# Patient Record
Sex: Female | Born: 1945 | Race: White | Hispanic: No | Smoking: Never smoker
Health system: Southern US, Community
[De-identification: ages and names within clinical notes are randomized; demographics above are authoritative.]

## PROBLEM LIST (undated history)

## (undated) DIAGNOSIS — C719 Malignant neoplasm of brain, unspecified: Secondary | ICD-10-CM

## (undated) HISTORY — DX: Malignant neoplasm of brain, unspecified: C71.9

---

## 2017-07-31 ENCOUNTER — Ambulatory Visit: Payer: Medicare Other | Admitting: Podiatry

## 2017-08-13 ENCOUNTER — Ambulatory Visit (INDEPENDENT_AMBULATORY_CARE_PROVIDER_SITE_OTHER): Payer: Medicare Other

## 2017-08-13 ENCOUNTER — Encounter: Payer: Self-pay | Admitting: Podiatry

## 2017-08-13 ENCOUNTER — Ambulatory Visit (INDEPENDENT_AMBULATORY_CARE_PROVIDER_SITE_OTHER): Payer: Medicare Other | Admitting: Podiatry

## 2017-08-13 DIAGNOSIS — D3610 Benign neoplasm of peripheral nerves and autonomic nervous system, unspecified: Secondary | ICD-10-CM

## 2017-08-17 NOTE — Progress Notes (Signed)
  Subjective:  Patient ID: Miranda Martinez, female    DOB: March 31, 1946,  MRN: [id number]  Chief Complaint  Patient presents with  . 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 female presents with the above complaint.  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  Reports recent flareup; states that it often does cause pain.  States the pain is a 1-1/2 of 10 today.  Was previously 4-10.  States that she just wanted to double check with a diagnosis was for her issue.  History reviewed. No pertinent past medical history. History reviewed. No pertinent surgical history. No current outpatient medications on file.  Allergies  Allergen Reactions  . Codeine   . Penicillins    Review of Systems all systems reviewed and negative except as noted in the HPI Objective:  There were no vitals filed for this visit. General AA&O x3. Normal mood and affect.  Vascular Dorsalis pedis and posterior tibial pulses  present 2+ bilaterally  Capillary refill normal to all digits. Pedal hair growth normal.  Neurologic Epicritic sensation grossly present.  Dermatologic No open lesions. Interspaces clear of maceration. Nails well groomed and normal in appearance.  Orthopedic: MMT 5/5 in dorsiflexion, plantarflexion, inversion, and eversion. Normal joint ROM without pain or crepitus. The Mulder's click left third interspace without pain to palpation   X-rays taken and reviewed.  No acute fractures and dislocations.  No osseous abnormalities noted Assessment & Plan:  Patient was evaluated and treated and all questions answered.  Neuroma left foot -Educated on etiology -Discussed with patient that should pain return would consider injection.  Patient does not wish any further treatment at this time.  Follow-up as needed for possible injection -Pads dispensed  Return if symptoms worsen or fail to improve.

## 2019-08-07 ENCOUNTER — Encounter: Payer: Self-pay | Admitting: Emergency Medicine

## 2019-08-07 ENCOUNTER — Ambulatory Visit
Admission: EM | Admit: 2019-08-07 | Discharge: 2019-08-07 | Disposition: A | Discharge status: Home / Self Care | Payer: Medicare Other | Attending: Emergency Medicine | Admitting: Emergency Medicine

## 2019-08-07 ENCOUNTER — Ambulatory Visit (INDEPENDENT_AMBULATORY_CARE_PROVIDER_SITE_OTHER): Payer: Medicare Other

## 2019-08-07 ENCOUNTER — Other Ambulatory Visit: Payer: Self-pay

## 2019-08-07 DIAGNOSIS — S82832A Other fracture of upper and lower end of left fibula, initial encounter for closed fracture: Secondary | ICD-10-CM

## 2019-08-07 DIAGNOSIS — R2242 Localized swelling, mass and lump, left lower limb: Secondary | ICD-10-CM | POA: Diagnosis not present

## 2019-08-07 DIAGNOSIS — S82452A Displaced comminuted fracture of shaft of left fibula, initial encounter for closed fracture: Secondary | ICD-10-CM

## 2019-08-07 DIAGNOSIS — M79672 Pain in left foot: Secondary | ICD-10-CM | POA: Diagnosis not present

## 2019-08-07 DIAGNOSIS — X501XXA Overexertion from prolonged static or awkward postures, initial encounter: Secondary | ICD-10-CM

## 2019-08-07 NOTE — Discharge Instructions (Addendum)
Recommend RICE: rest, ice, compression, elevation as needed for pain.    For pain: recommend 350 mg-1000 mg of Tylenol (acetaminophen) and/or 200 mg - 800 mg of Advil (ibuprofen, Motrin) every 8 hours as needed.  May alternate between the two throughout the day as they are generally safe to take together.  DO NOT exceed more than 3000 mg of Tylenol or 3200 mg of ibuprofen in a 24 hour period as this could damage your stomach, kidneys, liver, or increase your bleeding risk.

## 2019-08-07 NOTE — ED Notes (Signed)
Patient able to ambulate independently  

## 2019-08-07 NOTE — ED Provider Notes (Signed)
[facility]    CSN: [id number] Arrival date & time: 08/07/19  1503      History   Chief Complaint Chief Complaint  Patient presents with  . Fall    HPI Miranda Martinez is a 74 y.o. female presenting for left ankle pain, swelling status post fall around ten thirty this morning.  Patient states she was taking out the trash, rolled her ankle, fell.  Patient is to head trauma, LOC.  Has used ice with some relief.  Has been weight-bearing since accident.  Denies previous fracture.   History reviewed. No pertinent past medical history.  There are no active problems to display for this patient.   History reviewed. No pertinent surgical history.  OB History   No obstetric history on file.      Home Medications    Prior to Admission medications   Not on File    Family History Family History  Family history unknown: Yes    Social History Social History   Tobacco Use  . Smoking status: Never Smoker  . Smokeless tobacco: Never Used  Substance Use Topics  . Alcohol use: Never    Frequency: Never  . Drug use: Never     Allergies   Codeine and Penicillins   Review of Systems Review of Systems  Constitutional: Negative for fatigue and fever.  Respiratory: Negative for cough and shortness of breath.   Cardiovascular: Negative for chest pain and palpitations.  Musculoskeletal:       Positive for left ankle pain, swelling  Neurological: Negative for weakness and numbness.     Physical Exam Triage Vital Signs ED Triage Vitals [08/07/19 1523]  Enc Vitals Group     BP (!) 149/84     Pulse Rate 75     Resp 16     Temp 97.9 F (36.6 C)     Temp Source Temporal     SpO2 97 %     Weight      Height      Head Circumference      Peak Flow      Pain Score 2     Pain Loc      Pain Edu?      Excl. in [place]?    No data found.  Updated Vital Signs BP (!) 149/84 (BP Location: Right Arm)   Pulse 75   Temp 97.9 F (36.6 C) (Temporal)   Resp 16    SpO2 97%   Visual Acuity Right Eye Distance:   Left Eye Distance:   Bilateral Distance:    Right Eye Near:   Left Eye Near:    Bilateral Near:     Physical Exam Constitutional:      General: She is not in acute distress. HENT:     Head: Normocephalic and atraumatic.  Eyes:     General: No scleral icterus.    Pupils: Pupils are equal, round, and reactive to light.  Cardiovascular:     Rate and Rhythm: Normal rate.  Pulmonary:     Effort: Pulmonary effort is normal.  Musculoskeletal:     Left ankle: She exhibits decreased range of motion and swelling. She exhibits no ecchymosis, no deformity, no laceration and normal pulse. Achilles tendon exhibits no pain and no defect.       Feet:  Skin:    Coloration: Skin is not jaundiced or pale.  Neurological:     Mental Status: She is alert and oriented to person, place, and time.  UC Treatments / Results  Labs (all labs ordered are listed, but only abnormal results are displayed) Labs Reviewed - No data to display  EKG   Radiology Dg Ankle Complete Left  Result Date: 08/07/2019 CLINICAL DATA:  Fall.  Ankle swelling. EXAM: LEFT ANKLE COMPLETE - 3+ VIEW COMPARISON:  None. FINDINGS: Three views study shows a comminuted oblique fracture of the distal fibula, at the level of the ankle mortise. There is trace widening of the medial joint space. No associated fracture of the distal tibia evident. No worrisome lytic or sclerotic osseous abnormality. Soft tissue swelling associated. IMPRESSION: Comminuted distal fibula fracture with trace widening of the medial joint space at the ankle mortise. No associated distal tibial fracture evident. Electronically Signed   By: Misty Stanley M.D.   On: 08/07/2019 15:43   Dg Foot Complete Left  Result Date: 08/07/2019 CLINICAL DATA:  Fall.  Pain and swelling. EXAM: LEFT FOOT - COMPLETE 3+ VIEW COMPARISON:  08/13/2017. FINDINGS: Comminuted distal fibula fracture noted. No evidence for fracture  involving the bony anatomy of the foot. Soft tissue swelling noted about the ankle. IMPRESSION: Comminuted distal fibula fracture with ankle swelling. Electronically Signed   By: Misty Stanley M.D.   On: 08/07/2019 15:45    Procedures Procedures (including critical care time)  Medications Ordered in UC Medications - No data to display  Initial Impression / Assessment and Plan / UC Course  I have reviewed the triage vital signs and the nursing notes.  Pertinent labs & imaging results that were available during my care of the patient were reviewed by me and considered in my medical decision making (see chart for details).     Given mechanism of injury, gross edema x-rays of left foot, ankle were obtained in office, reviewed by me radiology: Positive for comminuted oblique distal fibula fracture.  Reviewed case with Dr. Stann Mainland (Ortho) who agrees assessment plan: Patient given cam walker boot, crutches for nonweightbearing ambulation/compression in office which he tolerated well.  Patient follow-up with Dr. Stann Mainland in office on Monday or Tuesday.  Return precautions discussed, patient verbalized understanding and is agreeable to plan. Final Clinical Impressions(s) / UC Diagnoses   Final diagnoses:  Closed fracture of distal end of left fibula, unspecified fracture morphology, initial encounter     Discharge Instructions     Recommend RICE: rest, ice, compression, elevation as needed for pain.    For pain: recommend 350 mg-1000 mg of Tylenol (acetaminophen) and/or 200 mg - 800 mg of Advil (ibuprofen, Motrin) every 8 hours as needed.  May alternate between the two throughout the day as they are generally safe to take together.  DO NOT exceed more than 3000 mg of Tylenol or 3200 mg of ibuprofen in a 24 hour period as this could damage your stomach, kidneys, liver, or increase your bleeding risk.    ED Prescriptions    None     PDMP not reviewed this encounter.   [place], [place],  [place] 08/07/19 1641

## 2019-08-07 NOTE — ED Triage Notes (Signed)
Pt presents to [facility] for assessment of left ankle pain after she slipped coming out the door and her knee buckled and her left ankle and foot bent under her.  Swelling and bruising noted to left ankle.

## 2021-07-12 IMAGING — DX DG FOOT COMPLETE 3+V*L*
3 series · 3 of 3 positions shown · non-contrast
Comparison: 08/13/2017.

CLINICAL DATA: Fall.  Pain and swelling.

EXAM:
LEFT FOOT - COMPLETE 3+ VIEW

[foot supine dp]
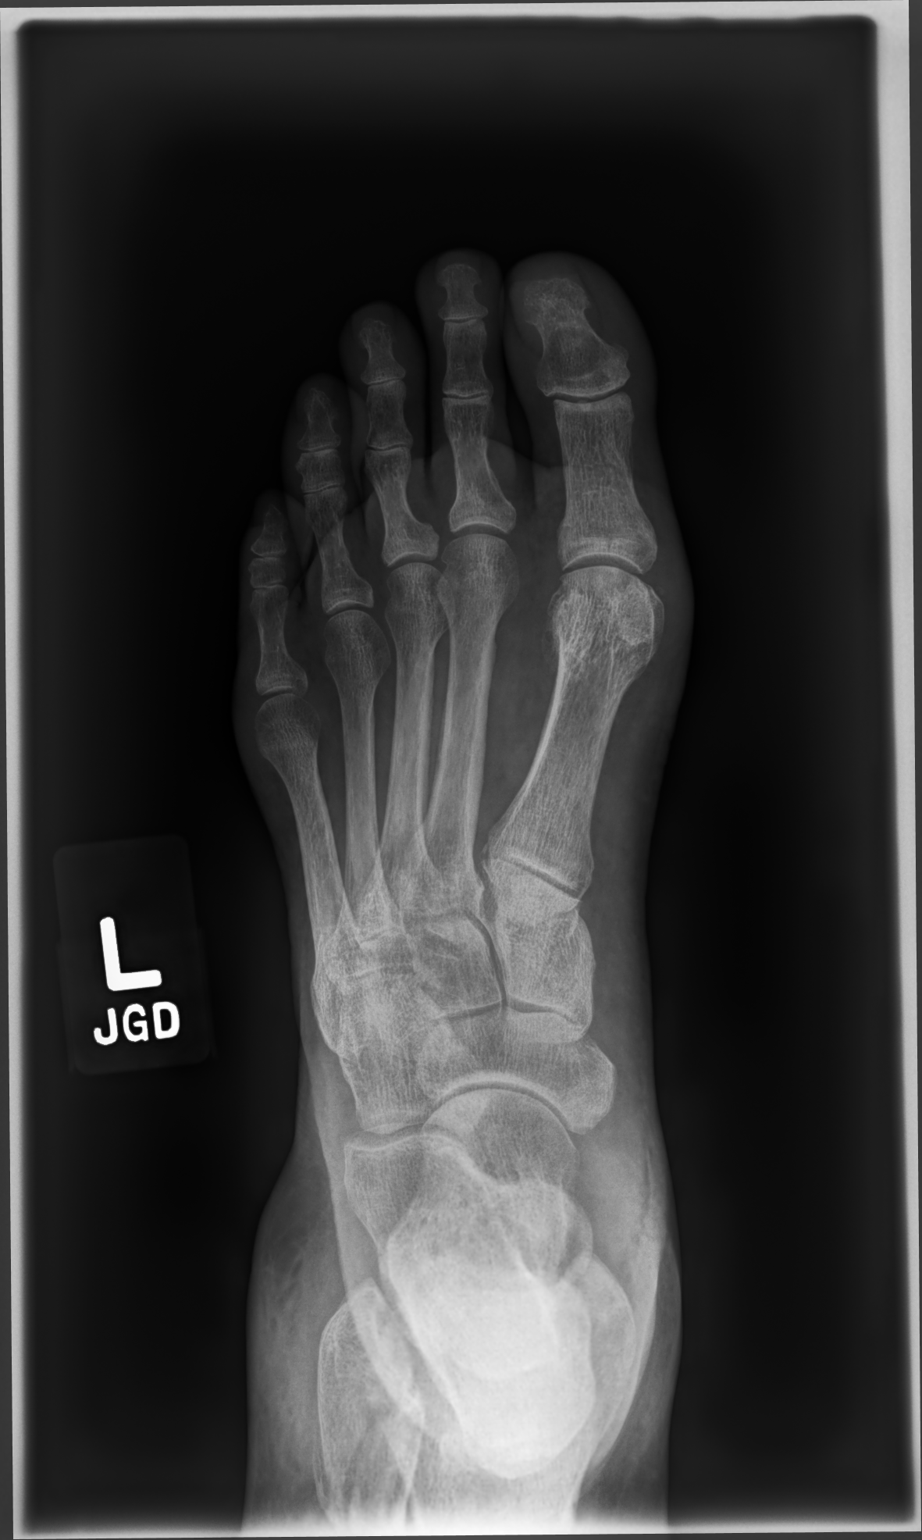

[foot medial oblique]
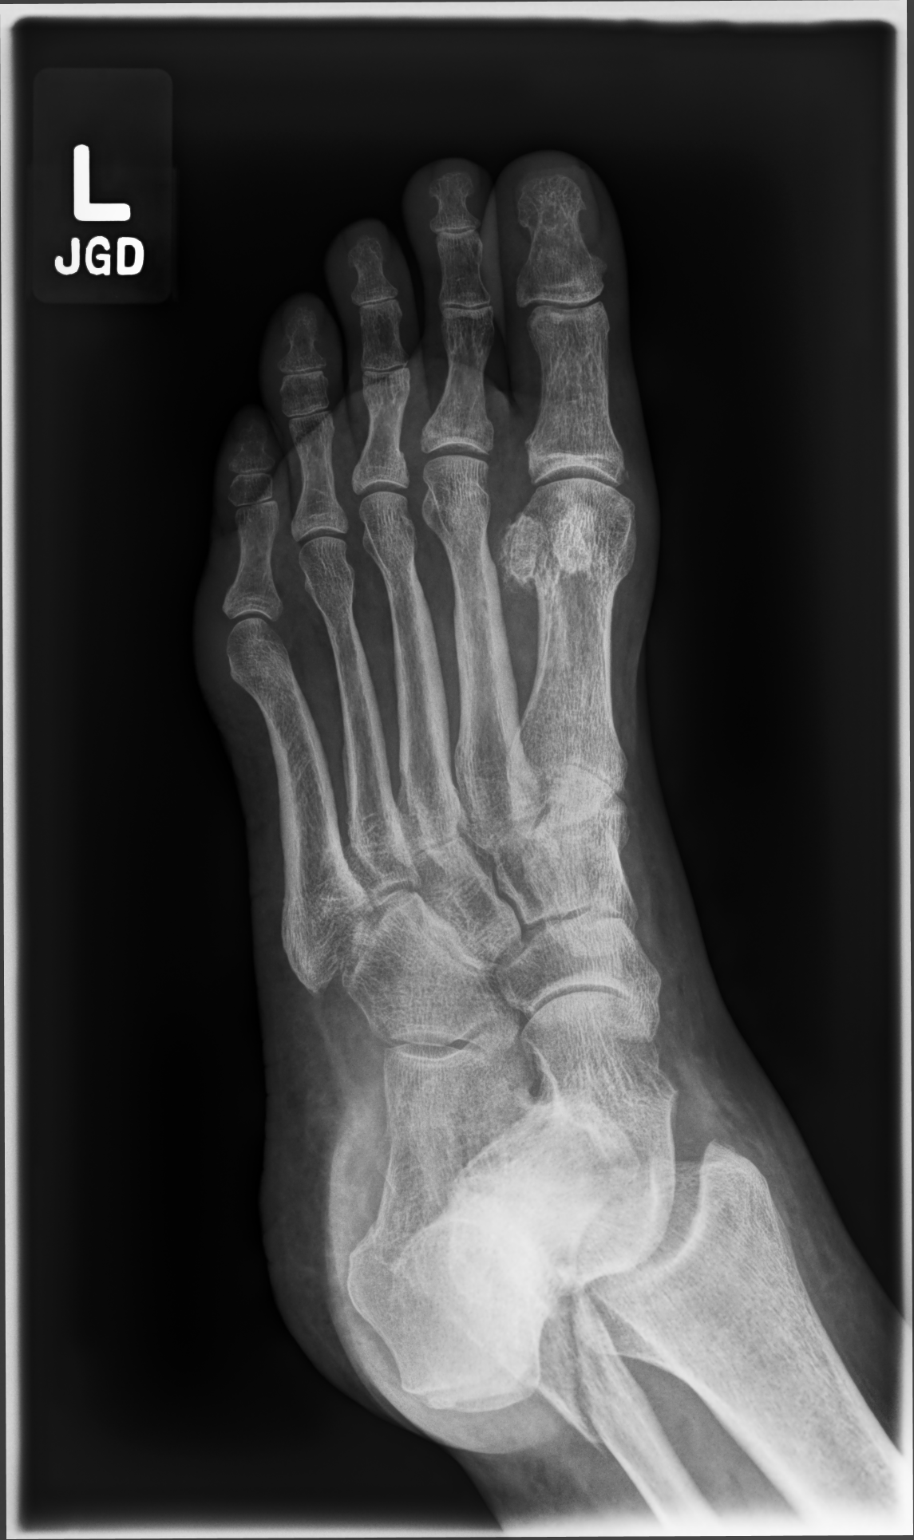

[foot supine lat]
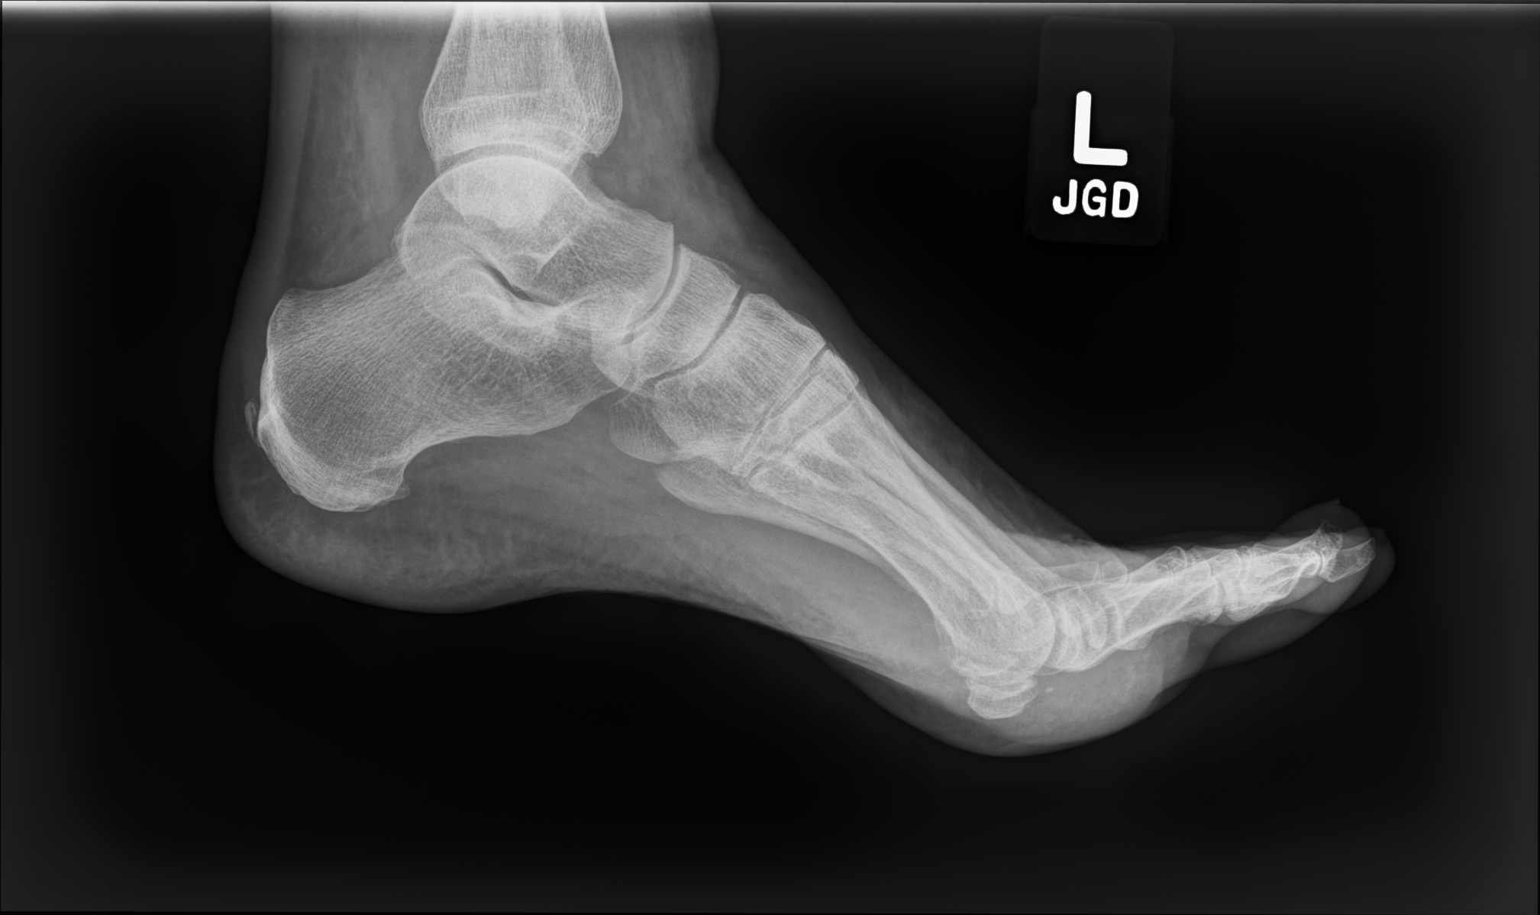

[3 of 3 positions shown; findings below may reference images not displayed]

FINDINGS: Comminuted distal fibula fracture noted. No evidence for fracture
involving the bony anatomy of the foot. Soft tissue swelling noted
about the ankle.
IMPRESSION: Comminuted distal fibula fracture with ankle swelling.

## 2021-07-12 IMAGING — DX DG ANKLE COMPLETE 3+V*L*
3 series · 3 of 3 positions shown · non-contrast
Comparison: None.

CLINICAL DATA: Fall.  Ankle swelling.

EXAM:
LEFT ANKLE COMPLETE - 3+ VIEW

[ankle ap]
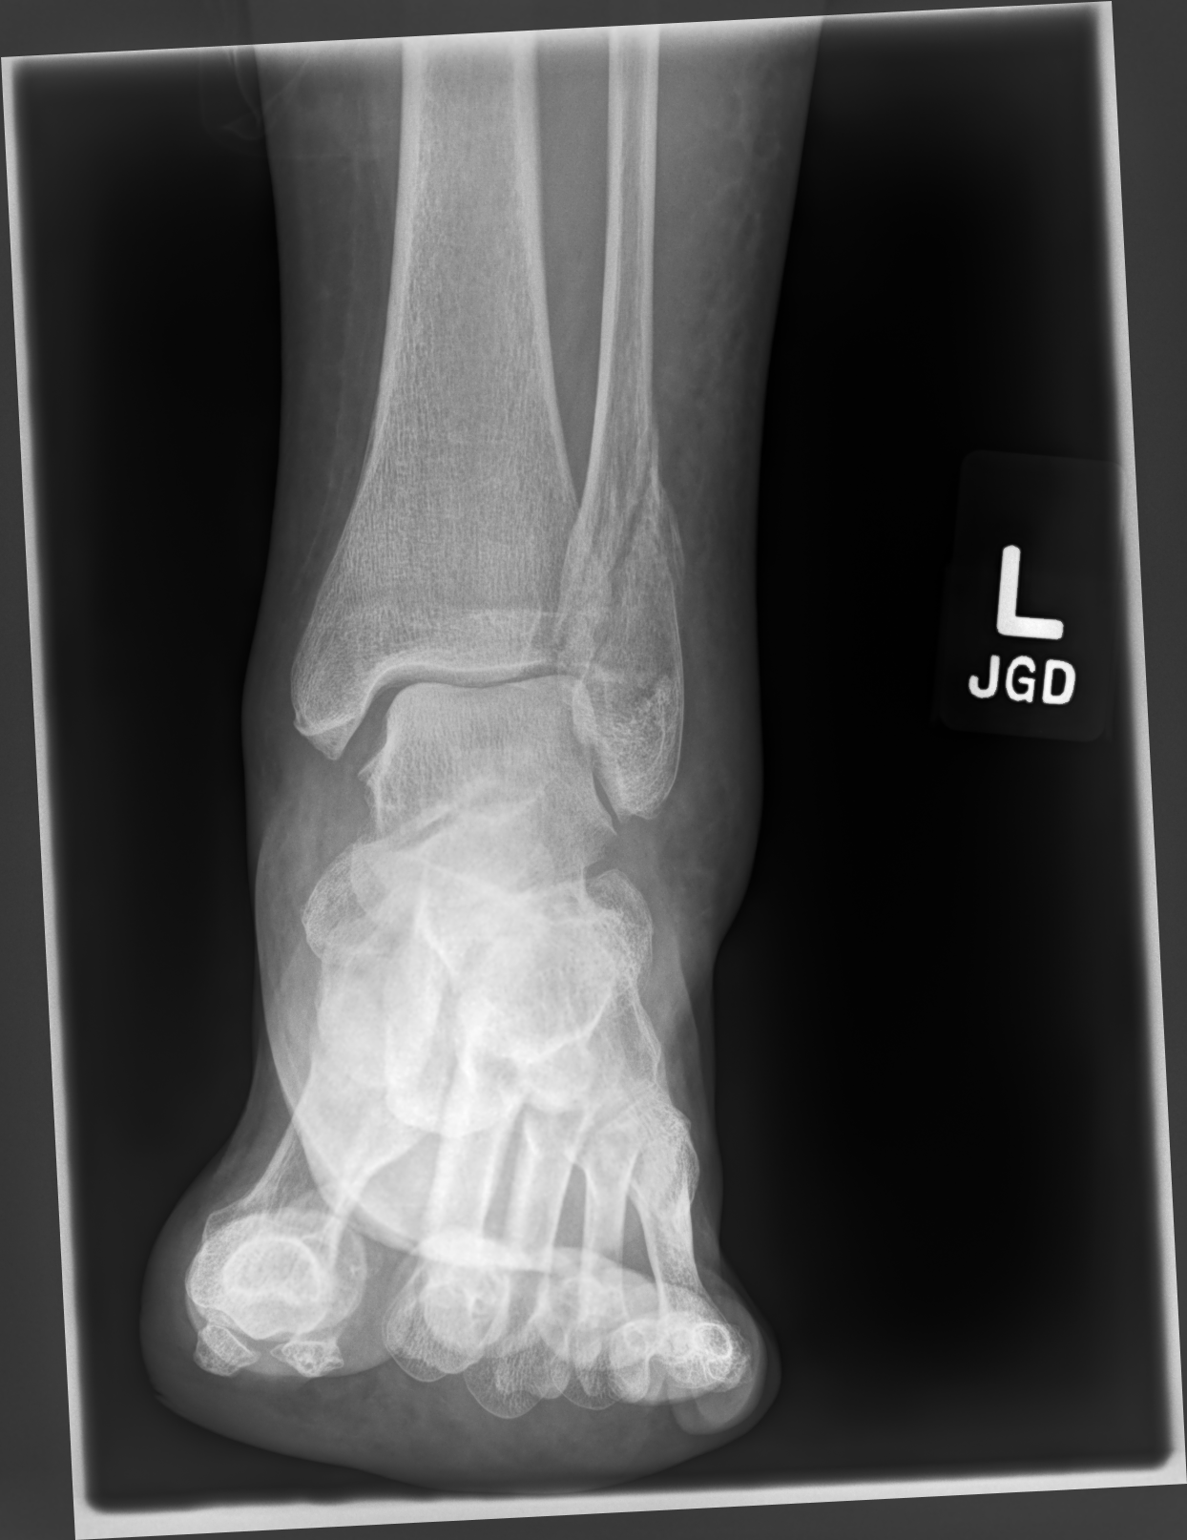

[ankle medial oblique]
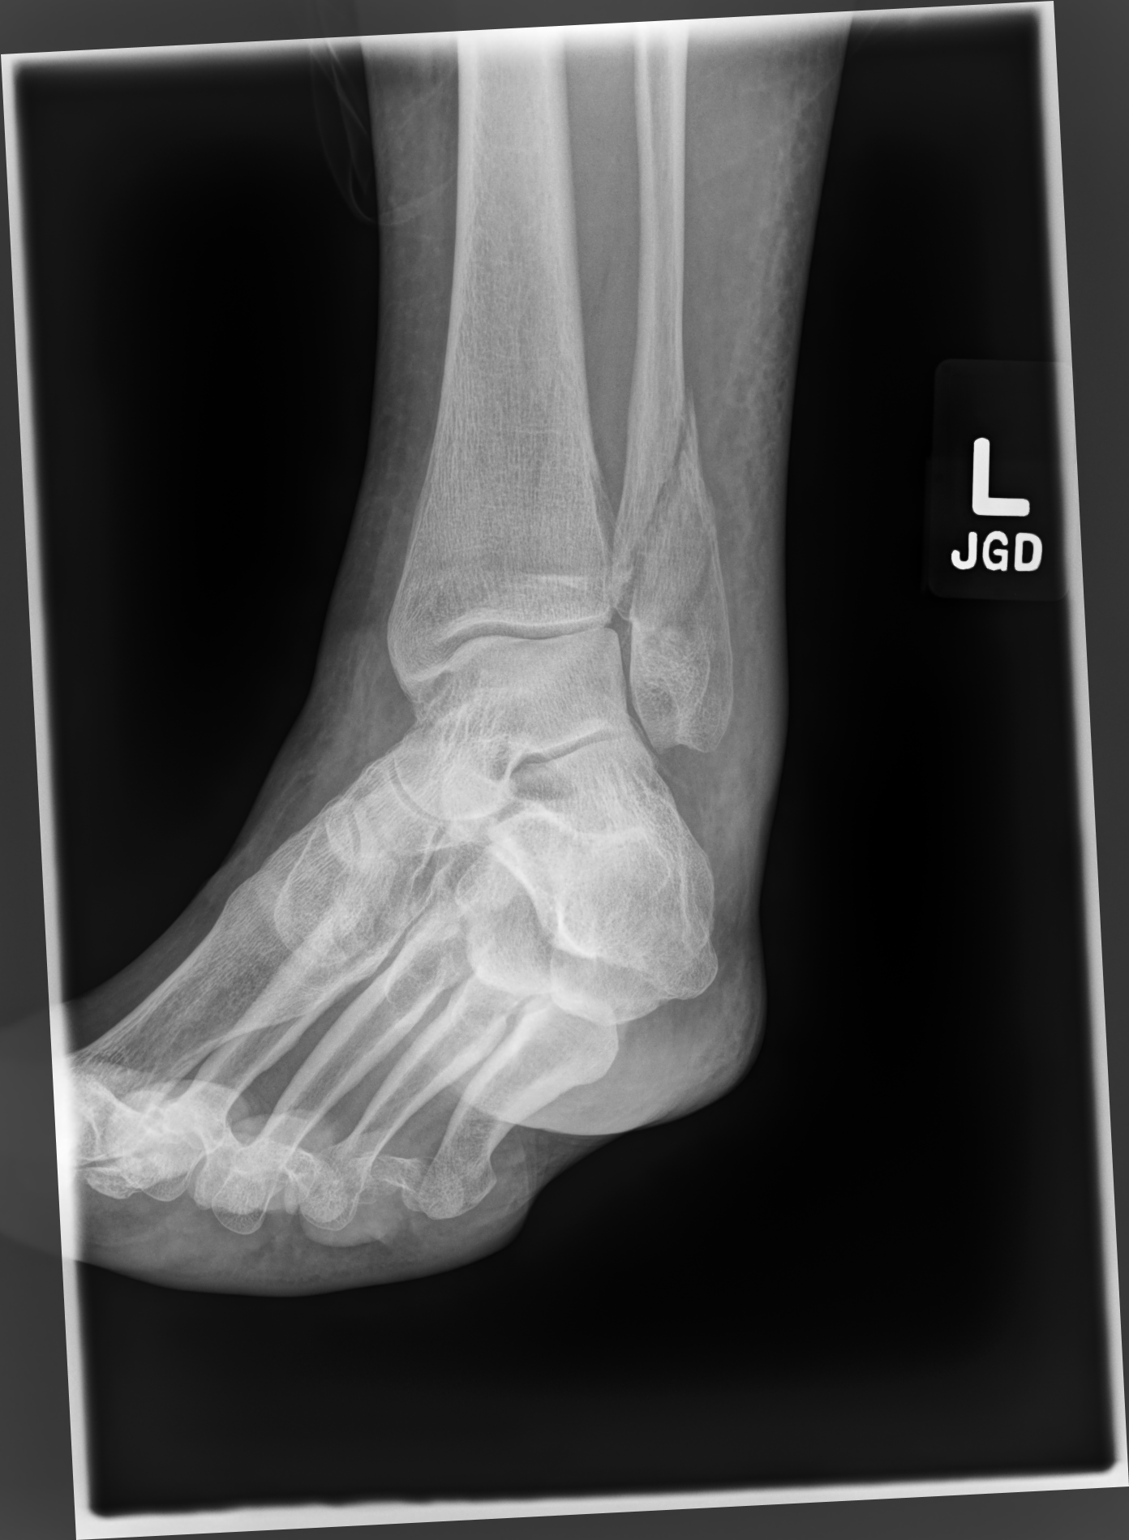

[ankle lat]
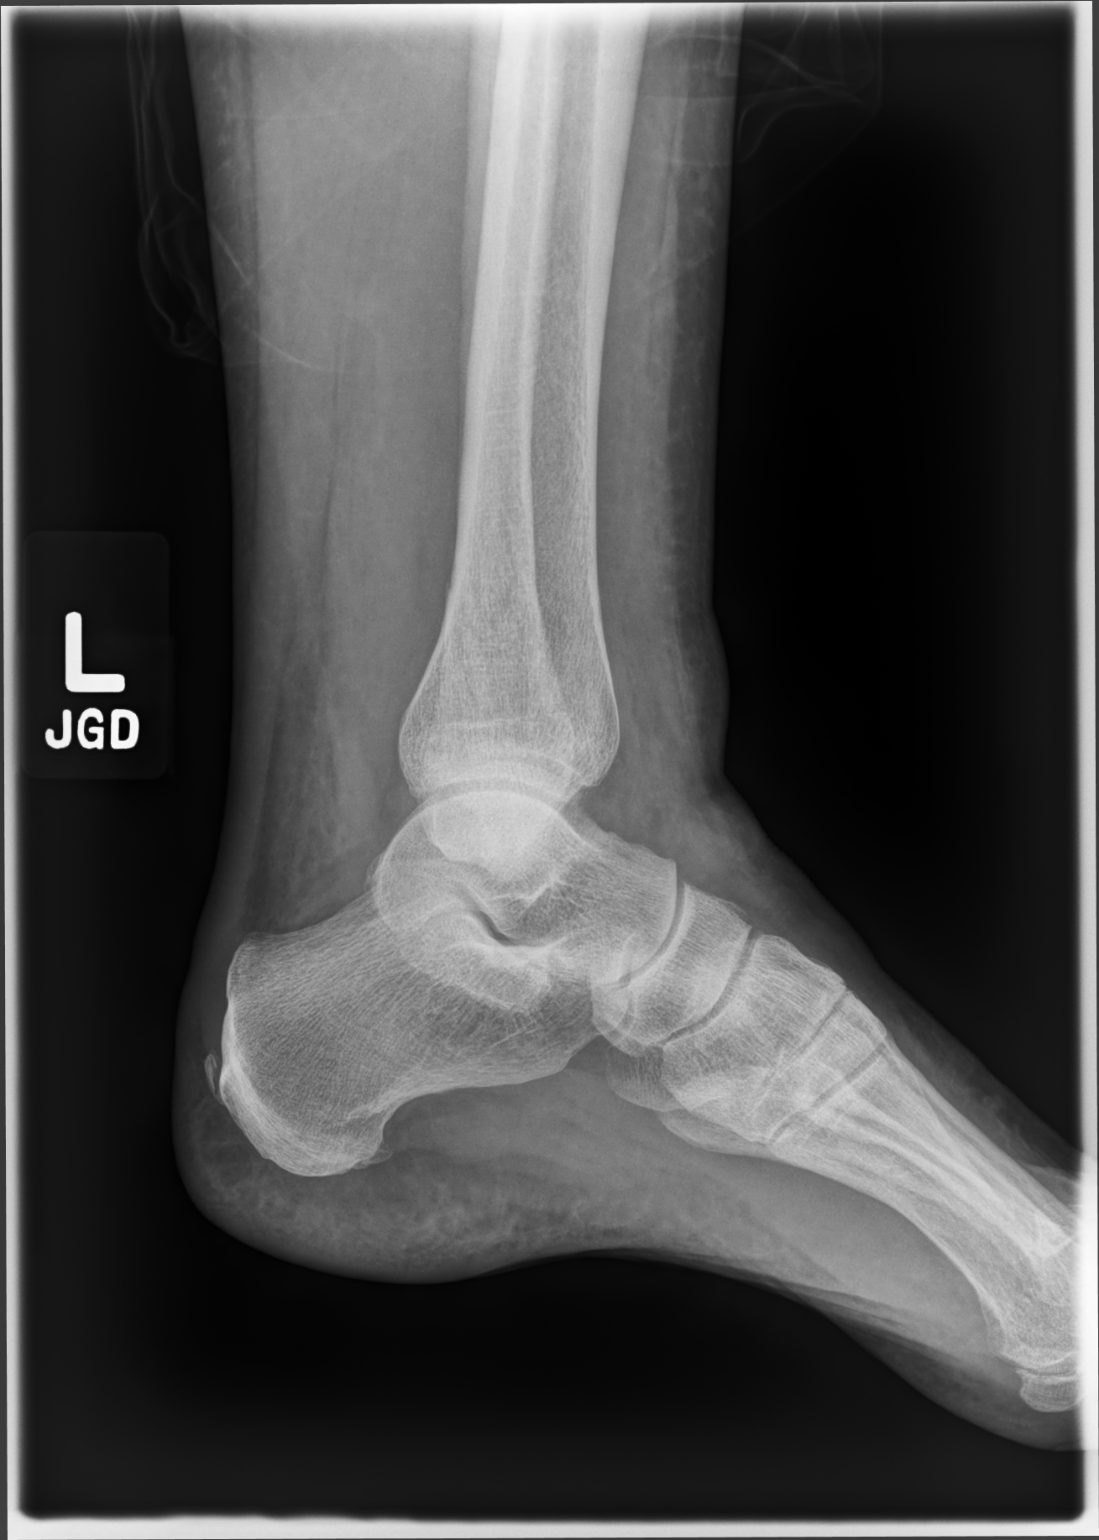

[3 of 3 positions shown; findings below may reference images not displayed]

FINDINGS: Three views study shows a comminuted oblique fracture of the distal
fibula, at the level of the ankle mortise. There is trace widening
of the medial joint space. No associated fracture of the distal
tibia evident. No worrisome lytic or sclerotic osseous abnormality.
Soft tissue swelling associated.
IMPRESSION: Comminuted distal fibula fracture with trace widening of the medial
joint space at the ankle mortise. No associated distal tibial
fracture evident.

## 2022-06-16 ENCOUNTER — Emergency Department (HOSPITAL_COMMUNITY): Payer: Medicare Other

## 2022-06-16 ENCOUNTER — Other Ambulatory Visit: Payer: Self-pay

## 2022-06-16 ENCOUNTER — Encounter (HOSPITAL_BASED_OUTPATIENT_CLINIC_OR_DEPARTMENT_OTHER): Payer: Self-pay | Admitting: Emergency Medicine

## 2022-06-16 ENCOUNTER — Emergency Department (HOSPITAL_BASED_OUTPATIENT_CLINIC_OR_DEPARTMENT_OTHER): Payer: Medicare Other

## 2022-06-16 ENCOUNTER — Inpatient Hospital Stay (HOSPITAL_BASED_OUTPATIENT_CLINIC_OR_DEPARTMENT_OTHER)
Admission: EM | Admit: 2022-06-16 | Discharge: 2022-06-21 | DRG: 025 | Disposition: A | Discharge status: Home Health | Payer: Medicare Other | Attending: Neurological Surgery | Admitting: Neurological Surgery

## 2022-06-16 DIAGNOSIS — R911 Solitary pulmonary nodule: Secondary | ICD-10-CM | POA: Diagnosis present

## 2022-06-16 DIAGNOSIS — G9389 Other specified disorders of brain: Secondary | ICD-10-CM | POA: Diagnosis not present

## 2022-06-16 DIAGNOSIS — R4701 Aphasia: Secondary | ICD-10-CM | POA: Diagnosis present

## 2022-06-16 DIAGNOSIS — E039 Hypothyroidism, unspecified: Secondary | ICD-10-CM | POA: Diagnosis present

## 2022-06-16 DIAGNOSIS — R030 Elevated blood-pressure reading, without diagnosis of hypertension: Secondary | ICD-10-CM | POA: Diagnosis not present

## 2022-06-16 DIAGNOSIS — M4856XA Collapsed vertebra, not elsewhere classified, lumbar region, initial encounter for fracture: Secondary | ICD-10-CM | POA: Diagnosis present

## 2022-06-16 DIAGNOSIS — D496 Neoplasm of unspecified behavior of brain: Secondary | ICD-10-CM | POA: Diagnosis present

## 2022-06-16 DIAGNOSIS — C712 Malignant neoplasm of temporal lobe: Principal | ICD-10-CM | POA: Diagnosis present

## 2022-06-16 DIAGNOSIS — R4702 Dysphasia: Secondary | ICD-10-CM | POA: Diagnosis present

## 2022-06-16 DIAGNOSIS — G936 Cerebral edema: Secondary | ICD-10-CM | POA: Diagnosis present

## 2022-06-16 LAB — URINALYSIS, ROUTINE W REFLEX MICROSCOPIC
Bilirubin Urine: NEGATIVE
Glucose, UA: NEGATIVE mg/dL
Hgb urine dipstick: NEGATIVE
Ketones, ur: NEGATIVE mg/dL
Leukocytes,Ua: NEGATIVE
Nitrite: NEGATIVE
Protein, ur: NEGATIVE mg/dL
Specific Gravity, Urine: 1.017 (ref 1.005–1.030)
pH: 5.5 (ref 5.0–8.0)

## 2022-06-16 LAB — CBG MONITORING, ED: Glucose-Capillary: 115 mg/dL — ABNORMAL HIGH (ref 70–99)

## 2022-06-16 LAB — CBC WITH DIFFERENTIAL/PLATELET
Abs Immature Granulocytes: 0.02 10*3/uL (ref 0.00–0.07)
Basophils Absolute: 0 10*3/uL (ref 0.0–0.1)
Basophils Relative: 0 %
Eosinophils Absolute: 0.1 10*3/uL (ref 0.0–0.5)
Eosinophils Relative: 2 %
HCT: 38.9 % (ref 36.0–46.0)
Hemoglobin: 13.1 g/dL (ref 12.0–15.0)
Immature Granulocytes: 0 %
Lymphocytes Relative: 28 %
Lymphs Abs: 1.6 10*3/uL (ref 0.7–4.0)
MCH: 32.7 pg (ref 26.0–34.0)
MCHC: 33.7 g/dL (ref 30.0–36.0)
MCV: 97 fL (ref 80.0–100.0)
Monocytes Absolute: 0.7 10*3/uL (ref 0.1–1.0)
Monocytes Relative: 11 %
Neutro Abs: 3.4 10*3/uL (ref 1.7–7.7)
Neutrophils Relative %: 59 %
Platelets: 236 10*3/uL (ref 150–400)
RBC: 4.01 MIL/uL (ref 3.87–5.11)
RDW: 13.2 % (ref 11.5–15.5)
WBC: 5.8 10*3/uL (ref 4.0–10.5)
nRBC: 0 % (ref 0.0–0.2)

## 2022-06-16 LAB — COMPREHENSIVE METABOLIC PANEL
ALT: 12 U/L (ref 0–44)
AST: 18 U/L (ref 15–41)
Albumin: 4 g/dL (ref 3.5–5.0)
Alkaline Phosphatase: 32 U/L — ABNORMAL LOW (ref 38–126)
Anion gap: 9 (ref 5–15)
BUN: 21 mg/dL (ref 8–23)
CO2: 24 mmol/L (ref 22–32)
Calcium: 9.4 mg/dL (ref 8.9–10.3)
Chloride: 105 mmol/L (ref 98–111)
Creatinine, Ser: 0.73 mg/dL (ref 0.44–1.00)
GFR, Estimated: >60 mL/min (ref >60)
Glucose, Bld: 104 mg/dL — ABNORMAL HIGH (ref 70–99)
Potassium: 3.9 mmol/L (ref 3.5–5.1)
Sodium: 138 mmol/L (ref 135–145)
Total Bilirubin: 0.5 mg/dL (ref 0.3–1.2)
Total Protein: 6.8 g/dL (ref 6.5–8.1)

## 2022-06-16 LAB — MAGNESIUM: Magnesium: 2.1 mg/dL (ref 1.7–2.4)

## 2022-06-16 LAB — T4, FREE: Free T4: 0.78 ng/dL (ref 0.61–1.12)

## 2022-06-16 LAB — AMMONIA: Ammonia: 28 umol/L (ref 9–35)

## 2022-06-16 LAB — TSH: TSH: 4.155 u[IU]/mL (ref 0.350–4.500)

## 2022-06-16 MED ORDER — MELATONIN 3 MG PO TABS: 3.0000 mg | ORAL_TABLET | Freq: Every evening | ORAL | Status: DC | PRN

## 2022-06-16 MED ORDER — GADOPICLENOL 0.5 MMOL/ML IV SOLN
5.5000 mL | Freq: Once | INTRAVENOUS | Status: AC | PRN
  Administered 2022-06-16: 5.5 mL via INTRAVENOUS

## 2022-06-16 MED ORDER — ACETAMINOPHEN 325 MG PO TABS: 650.0000 mg | ORAL_TABLET | Freq: Four times a day (QID) | ORAL | Status: DC | PRN

## 2022-06-16 MED ORDER — ONDANSETRON HCL 4 MG PO TABS: 4.0000 mg | ORAL_TABLET | Freq: Four times a day (QID) | ORAL | Status: DC | PRN

## 2022-06-16 MED ORDER — ONDANSETRON HCL 4 MG/2ML IJ SOLN: 4.0000 mg | Freq: Four times a day (QID) | INTRAMUSCULAR | Status: DC | PRN

## 2022-06-16 MED ORDER — SENNOSIDES-DOCUSATE SODIUM 8.6-50 MG PO TABS: 1.0000 | ORAL_TABLET | Freq: Every evening | ORAL | Status: DC | PRN

## 2022-06-16 MED ORDER — ACETAMINOPHEN 650 MG RE SUPP: 650.0000 mg | Freq: Four times a day (QID) | RECTAL | Status: DC | PRN

## 2022-06-16 MED ORDER — IOHEXOL 300 MG/ML  SOLN
100.0000 mL | Freq: Once | INTRAMUSCULAR | Status: AC | PRN
  Administered 2022-06-16: 75 mL via INTRAVENOUS

## 2022-06-16 MED ORDER — DEXAMETHASONE SODIUM PHOSPHATE 10 MG/ML IJ SOLN
10.0000 mg | Freq: Once | INTRAMUSCULAR | Status: AC
  Administered 2022-06-16: 10 mg via INTRAVENOUS
  Filled 2022-06-16: qty 1

## 2022-06-16 NOTE — ED Triage Notes (Signed)
Pt states she had a spider bite back in July and was treatedwith  doxy and the symptoms happened after that and have only worsened. Did not seek any help for neuro changes.

## 2022-06-16 NOTE — H&P (Signed)
History and Physical    Miranda Martinez AJO:[id number] DOB: [birth year] DOA: 06/16/2022  PCP: [name], DO  Patient coming from: Home  I have personally briefly reviewed patient's old medical records in [place]  Chief Complaint: Confusion  HPI: Miranda Martinez is a 76 y.o. female without significant medical history who presented to the ED for evaluation of several months of confusion and word finding difficulty.  Patient states that around the end of June 2023 she developed new word finding difficulties/expressive aphasia.  She also states that she has been having difficulty with math and numbers.  She lives alone and is functionally independent.  During this time she has been able to complete her IADLs without issue.  She has not had any significant headache, nausea, vomiting, change in vision, new weakness in her arms or legs, change in sensation, or gait difficulty.  She has noted that she becomes increasingly fatigued later in the day and that is when her symptoms are more pronounced.  ED Course  Labs/Imaging on admission: I have personally reviewed following labs and imaging studies.  Patient initially presented to [place] ED.  Initial vitals showed BP 128/82, pulse 67, RR 16, temp 98.3 F, SPO2 98% on room air.  Labs show sodium 138, potassium 3.9, bicarb 24, BUN 21, creatinine 0.73, serum glucose 104, WBC 5.8, hemoglobin 13.1, platelets 236,000, ammonia 28.  TSH 4.155, free T40.78, magnesium 2.1.  UA negative for UTI.  CT head without contrast showed 12 mm focus of hyperdensity within the left temporoparietal white matter with surrounding edema in the left cerebral hemisphere.  Loss of gray-white differentiation within portions of left temporal lobe and left insula noted.  With associated mass effect with partial effacement of the left lateral ventricle and 5 mm rightward midline shift seen.  Patient was given IV Decadron 10 mg.  CT chest/abdomen/pelvis with  contrast negative for acute findings.  No evidence of a primary malignancy or convincing metastatic disease.  3 mm left lower lobe pulmonary nodule seen, most likely benign per radiology read.  Mild compression fractures of L1 and L2 of unclear chronicity noted.  Patient transferred to [facility] ED to obtain MRI.  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  There is associated mass effect and 7 mm of left to right midline shift with effacement of the left lateral and third ventricle but no evidence of hydrocephalus.  EDP discussed with on-call neurosurgery, Dr. Zada Finders, who recommended medical admission and to avoid further steroids as glucocorticoids will decrease biopsy yield.  Review of Systems: All systems reviewed and are negative except as documented in history of present illness above.   History reviewed. No pertinent past medical history.  History reviewed. No pertinent surgical history.  Social History:  reports that she has never smoked. She has never used smokeless tobacco. She reports that she does not drink alcohol and does not use drugs.  Allergies  Allergen Reactions   Codeine    Penicillins     Family History  Family history unknown: Yes     Prior to Admission medications   Not on File    Physical Exam: Vitals:   06/16/22 1517 06/16/22 1530 06/16/22 1843 06/16/22 1943  BP: (!) 146/68 130/67 (!) 151/78 (!) 155/79  Pulse: 60 (!)  58 70 71  Resp: '16 16 13 19  '$ Temp: 98.1 F (36.7 C)  98 F (36.7 C)   TempSrc: Oral  Oral   SpO2: 99% 99% 97% 100%   Constitutional: Sitting up in bed, NAD, calm, comfortable Eyes: PERRL, EOMI, lids and conjunctivae normal ENMT: Mucous membranes are moist. Posterior pharynx  clear of any exudate or lesions.Normal dentition.  Neck: normal, supple, no masses. Respiratory: clear to auscultation bilaterally, no wheezing, no crackles. Normal respiratory effort. No accessory muscle use.  Cardiovascular: Regular rate and rhythm, no murmurs / rubs / gallops. No extremity edema. 2+ pedal pulses. Abdomen: no tenderness, no masses palpated. Musculoskeletal: no clubbing / cyanosis. No joint deformity upper and lower extremities. Good ROM, no contractures. Normal muscle tone.  Skin: no rashes, lesions, ulcers. No induration Neurologic: Some word finding difficulty/expressive aphasia, CN 2-12 grossly intact. Sensation intact. Strength 5/5 in all 4.  Psychiatric: Alert and oriented x 3.  EKG: Not performed.  Assessment/Plan Principal Problem:   Brain tumor ([place])   Miranda Martinez is a 76 y.o. female without significant medical history who presented with several months of word finding difficulty found to have changes suspicious for left-sided brain tumor on MRI.  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  Neurosurgery, Dr. Zada Finders, recommending medical admission and will discuss biopsy with the patient. -Keep n.p.o. after midnight -Hold pharmacologic VTE prophylaxis -No further glucocorticoids which would decrease biopsy yield  DVT prophylaxis: SCDs Start: 06/16/22 2146 Code Status: Full code, confirmed with patient on admission Family Communication: Son at bedside Disposition Plan: From home, dispo pending neurosurgery evaluation and recommendations Consults called: Neurosurgery Severity of Illness: The appropriate patient status for this patient is INPATIENT. Inpatient status is judged to be reasonable and necessary in order to provide the required intensity of service to ensure the patient's safety. The patient's presenting symptoms, physical exam  findings, and initial radiographic and laboratory data in the context of their chronic comorbidities is felt to place them at high risk for further clinical deterioration. Furthermore, it is not anticipated that the patient will be medically stable for discharge from the hospital within 2 midnights of admission.   * I certify that at the point of admission it is my clinical judgment that the patient will require inpatient hospital care spanning beyond 2 midnights from the point of admission due to high intensity of service, high risk for further deterioration and high frequency of surveillance required.Zada Finders MD [facility]  If 7PM-7AM, please contact night-coverage [email or web address]  06/16/2022, 9:56 PM

## 2022-06-16 NOTE — ED Triage Notes (Signed)
Pt presents today with son ,pt has had intermittent confusion/word finding difficulty since July, she can take care of herself but retrieval of information is difficult per son. She states she has a little person that sits on her right ear and tells her to garden.

## 2022-06-16 NOTE — ED Notes (Signed)
ED to ED Xfr from [facility]  to [facility]. Mickel Baas @ [facility] will send transport. ABB(NS) 15:52

## 2022-06-16 NOTE — Consult Note (Signed)
Full consult note to follow. MRI/CT reviewed, diffuse areas of left hemispheric T2 changes with some focal diffusion changes, no clear enhancement, edema is fairly extensive and extends down into the basal ganglia. No acute neurosurgical plan at this time, will discuss biopsy with the patient.  Please do not give steroids. This could certainly be lymphoma and glucocorticoids will decrease biopsy yield.

## 2022-06-16 NOTE — Hospital Course (Signed)
Miranda Martinez is a 76 y.o. female without significant medical history who presented with several months of word finding difficulty found to have changes suspicious for left-sided brain tumor on MRI.

## 2022-06-16 NOTE — ED Provider Notes (Signed)
Care of the patient received after the patient was transferred from [facility] for MRI.  Provided by patient's son at bedside.  In short, 76 year old female who presented with episodes of aphasia and confusion since about July of this year.  She reportedly had several spider bites back in June and started to take doxycycline, when she started to develop episodes of aphasia and confusion.  She is otherwise independent and lives alone.  She also was noted to have increasing anxiety.  Her son states that they thought that she might of had increased toxicity of doxycycline and so this was stopped.  She was also seen at urgent care and had some blood work done and the nurses in passing stated that she may have had diabetes.  The provider did not inform them that she has diabetes but the patient ruminated on this and drastically changed her diet.  She has lost about 30 pounds in the last 6 weeks.  The patient persistently has episodes of aphasia but has been resistant to seek medical evaluation for this.  Her son states that today she agreed to be evaluated in was seen at [facility].  She had a CT scan of the head done which showed a 12 mm hyperdensity in the left temporoparietal matter with surrounding edema of the left cerebral hemisphere with loss of gray-white differentiation with concerns for a possible hemorrhagic mass or recent infarct with hemorrhagic conversion.  Case was discussed with neurosurgery who recommended MRI of the brain and transferred to [facility].  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  Case  was discussed with neurosurgery who recommended admission, and no steroid administration.  Spoke with Dr. Posey Pronto who will admit the patient for further evaluation and treatment.  Results for orders placed or performed during the hospital encounter of 06/16/22  CBC with Differential  Result Value Ref Range   WBC 5.8 4.0 - 10.5 K/uL   RBC 4.01 3.87 - 5.11 MIL/uL   Hemoglobin 13.1 12.0 - 15.0 g/dL   HCT 38.9 36.0 - 46.0 %   MCV 97.0 80.0 - 100.0 fL   MCH 32.7 26.0 - 34.0 pg   MCHC 33.7 30.0 - 36.0 g/dL   RDW 13.2 11.5 - 15.5 %   Platelets 236 150 - 400 K/uL   nRBC 0.0 0.0 - 0.2 %   Neutrophils Relative % 59 %   Neutro Abs 3.4 1.7 - 7.7 K/uL   Lymphocytes Relative 28 %   Lymphs Abs 1.6 0.7 - 4.0 K/uL   Monocytes Relative 11 %   Monocytes Absolute 0.7 0.1 - 1.0 K/uL   Eosinophils Relative 2 %   Eosinophils Absolute 0.1 0.0 - 0.5 K/uL   Basophils Relative 0 %   Basophils Absolute 0.0 0.0 - 0.1 K/uL   Immature Granulocytes 0 %   Abs Immature Granulocytes 0.02 0.00 - 0.07 K/uL  Comprehensive metabolic panel  Result Value Ref Range   Sodium 138 135 - 145 mmol/L   Potassium 3.9 3.5 - 5.1 mmol/L   Chloride 105 98 - 111 mmol/L   CO2 24 22 - 32 mmol/L   Glucose, Bld 104 (  H) 70 - 99 mg/dL   BUN 21 8 - 23 mg/dL   Creatinine, Ser 0.73 0.44 - 1.00 mg/dL   Calcium 9.4 8.9 - 10.3 mg/dL   Total Protein 6.8 6.5 - 8.1 g/dL   Albumin 4.0 3.5 - 5.0 g/dL   AST 18 15 - 41 U/L   ALT 12 0 - 44 U/L   Alkaline Phosphatase 32 (L) 38 - 126 U/L   Total Bilirubin 0.5 0.3 - 1.2 mg/dL   GFR, Estimated >60 >60 mL/min   Anion gap 9 5 - 15  Urinalysis, Routine w reflex microscopic Urine, Clean Catch  Result Value Ref Range   Color, Urine YELLOW YELLOW   APPearance CLEAR CLEAR   Specific Gravity, Urine 1.017 1.005 - 1.030   pH 5.5 5.0 - 8.0   Glucose, UA NEGATIVE NEGATIVE mg/dL   Hgb urine dipstick NEGATIVE NEGATIVE   Bilirubin Urine NEGATIVE NEGATIVE   Ketones, ur NEGATIVE NEGATIVE mg/dL   Protein,  ur NEGATIVE NEGATIVE mg/dL   Nitrite NEGATIVE NEGATIVE   Leukocytes,Ua NEGATIVE NEGATIVE  Ammonia  Result Value Ref Range   Ammonia 28 9 - 35 umol/L  TSH  Result Value Ref Range   TSH 4.155 0.350 - 4.500 uIU/mL  T4, free  Result Value Ref Range   Free T4 0.78 0.61 - 1.12 ng/dL  Magnesium  Result Value Ref Range   Magnesium 2.1 1.7 - 2.4 mg/dL  CBG monitoring, ED  Result Value Ref Range   Glucose-Capillary 115 (H) 70 - 99 mg/dL   CT Head Wo Contrast  Addendum Date: 06/16/2022   ADDENDUM REPORT: 06/16/2022 19:50 ADDENDUM: There is a dictation error within the second line of the first impression, which should read: Additionally, there is loss of gray-white differentiation within portions of the anterolateral left FRONTAL lobe and left insula." Electronically Signed   By: Kellie Simmering D.O.   On: 06/16/2022 19:50   Result Date: 06/16/2022 CLINICAL DATA:  Provided history: Mental status change, unknown cause. Intermittent confusion and word-finding difficulty. EXAM: CT HEAD WITHOUT CONTRAST TECHNIQUE: Contiguous axial images were obtained from the base of the skull through the vertex without intravenous contrast. RADIATION DOSE REDUCTION: This exam was performed according to the departmental dose-optimization program which includes automated exposure control, adjustment of the mA and/or kV according to patient size and/or use of iterative reconstruction technique. COMPARISON:  No pertinent prior exams available for comparison. FINDINGS: Brain: 12 mm hyperdense focus in the left temporoparietal white matter with surrounding edema (for instance as seen on series 2, image 15). Ill-defined focus of hyperdensity more laterally along the posterior left temporal lobe, measuring 15 mm (for instance as seen on series 2, image 14) (series 4, image 23). Apparent loss of gray-white differentiation within portions of the anterolateral left frontal lobe and left insula. Mass effect arising from the left cerebral  hemisphere with partial effacement of the left lateral ventricle and 5 mm rightward midline shift. Background mild patchy and ill-defined hypoattenuation within the cerebral white matter, nonspecific but compatible chronic small vessel disease. Vascular: No hyperdense vessel.  Atherosclerotic calcifications. Skull: No fracture or aggressive osseous lesion. Sinuses/Orbits: No mass or acute finding within the imaged orbits. No significant paranasal sinus disease at the imaged levels. These results were called by telephone at the time of interpretation on 06/16/2022 at 2:12 pm to provider RILEY RANSOM , who verbally acknowledged these results. IMPRESSION: 12 mm focus of hyperdensity within the left temporoparietal white matter with surrounding edema in the left  cerebral hemisphere. Additionally, there is loss of gray-white differentiation within portions of the anterolateral left temporal lobe and left insula. Although nonspecific, this constellation of findings could potentially reflect a hemorrhagic mass or a recent infarct with hemorrhagic conversion. Associated mass effect with partial effacement of the left lateral ventricle and 5 mm rightward midline shift. A brain MRI without and with contrast is recommended for further characterization. Additional 15 mm focus of hyperdensity along the posterior left temporal lobe, which may reflect acute parenchymal or subarachnoid hemorrhage or other hyperdense lesion. Background mild chronic small vessel image changes within the cerebral white matter. Electronically Signed: By: Kellie Simmering D.O. On: 06/16/2022 14:14   MR Brain W and Wo Contrast  Result Date: 06/16/2022 CLINICAL DATA:  Mental status change, mass suspected on head CT EXAM: MRI HEAD WITHOUT AND WITH CONTRAST TECHNIQUE: Multiplanar, multiecho pulse sequences of the brain and surrounding structures were obtained without and with intravenous contrast. CONTRAST:  5.5 mL Vueway COMPARISON:  No prior head MRI,  correlation is made with 06/16/2022 CT head FINDINGS: Brain: 2 diffusion restricting areas with ADC correlates in the left medial temporal lobe (series 5, image 79) and left posterolateral temporal lobe (series 5, image 77), which correlate with the hyperdense area on the same-day CT. These areas are associated with increased T2 hyperintense signal and decreased T1 signal, but no definite enhancement. The more medial region measures 9 x 14 x 6 mm (series 16, image 30 and series 19, image 13). The more lateral region is less well-defined but measures approximately 21 x 23 x 13 mm (series 16, image 28 and series 19, image 9). Associated surrounding T2 hyperintense signal extends anteriorly into the left inferior frontal lobe, throughout the left temporal lobe, left basal ganglia and thalamus and into the left parietal and occipital lobes. There is associated mass effect with 7 mm left-to-right midline shift. Effacement of the left lateral and third ventricle, without evidence of hydrocephalus or entrapment of the left temporal horn. No acute infarct, hemorrhage, or extra-axial collection. Vascular: Normal arterial flow voids. Normal arterial and venous enhancement. Skull and upper cervical spine: Normal marrow signal. Sinuses/Orbits: No acute finding. Other: The mastoids are well aerated. IMPRESSION: Two diffusion restricting areas in the left medial and posterolateral temporal lobe, with extensive surrounding T2 hyperintense signal, but no definite enhancement. 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 The suspected hypercellular tumor correlates with the hyperdense areas on the same-day CT This is associated with mass effect and 7 mm of left-to-right midline shift, with effacement of the left lateral and third ventricle but no evidence of hydrocephalus. Electronically  Signed   By: Merilyn Baba M.D.   On: 06/16/2022 19:48   CT CHEST ABDOMEN PELVIS W CONTRAST  Result Date: 06/16/2022 CLINICAL DATA:  Brain metastatic disease. 20-30 pound weight loss since July. EXAM: CT CHEST, ABDOMEN, AND PELVIS WITH CONTRAST TECHNIQUE: Multidetector CT imaging of the chest, abdomen and pelvis was performed following the standard protocol during bolus administration of intravenous contrast. RADIATION DOSE REDUCTION: This exam was performed according to the departmental dose-optimization program which includes automated exposure control, adjustment of the mA and/or kV according to patient size and/or use of iterative reconstruction technique. CONTRAST:  44m OMNIPAQUE IOHEXOL 300 MG/ML  SOLN COMPARISON:  None Available. FINDINGS: CT CHEST FINDINGS Cardiovascular: Heart normal in size and configuration. No pericardial effusion. Great vessels are normal  in caliber. No thoracic aortic dissection or significant atherosclerosis. Arch branch vessels are widely patent. Mediastinum/Nodes: Normal thyroid. No neck base, mediastinal or hilar masses or enlarged lymph nodes. Trachea and esophagus are unremarkable. Lungs/Pleura: Mild peripheral interstitial thickening. Mild apical scarring. 3 mm subpleural nodule, left lower lobe, image 112, series 4. No other nodules. No masses. No evidence of pneumonia or pulmonary edema. No pleural effusion or pneumothorax. Musculoskeletal: No fracture or acute finding. No osteoblastic or osteolytic lesions. No chest wall mass. CT ABDOMEN PELVIS FINDINGS Hepatobiliary: No focal liver abnormality is seen. No gallstones, gallbladder wall thickening, or biliary dilatation. Pancreas: Unremarkable. No pancreatic ductal dilatation or surrounding inflammatory changes. Spleen: Normal in size without focal abnormality. Adrenals/Urinary Tract: Normal adrenal glands. Kidneys normal in size, orientation and position with symmetric enhancement and excretion. 3-4 mm low-attenuation  lesion, midpole of the right kidney, too small to characterize but consistent with a cyst. No follow-up recommended. No other renal masses or lesions, no stones and no hydronephrosis. Normal ureters. Normal bladder. Stomach/Bowel: Normal stomach. Small bowel and colon are normal in caliber. No wall thickening. No evidence of a mass. No inflammation. Normal appendix visualized. Vascular/Lymphatic: Minor aortic atherosclerosis. No aneurysm. No enlarged lymph nodes. Reproductive: Uterus and bilateral adnexa are unremarkable. Other: No abdominal wall hernia or abnormality. No abdominopelvic ascites. Musculoskeletal: Mild compression fractures of L1 and L2, unclear chronicity, most likely chronic. No bone lesion. IMPRESSION: 1. No acute findings within the chest, abdomen or pelvis. 2. No evidence of a primary malignancy. No convincing metastatic disease within the chest, abdomen and pelvis. 3 mm left lower lobe pulmonary nodule, most likely benign. Recommend attention on follow-up imaging. 3. Mild compression fractures of L1 and L2, of unclear chronicity, but likely chronic. Electronically Signed   By: Lajean Manes M.D.   On: 06/16/2022 15:34      Garald Balding, PA-C 06/16/22 [id number]    Audley Hose, MD 06/17/22 [id number]

## 2022-06-16 NOTE — ED Notes (Signed)
Patient transported to MRI 

## 2022-06-16 NOTE — Assessment & Plan Note (Signed)
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  Neurosurgery, Dr. Zada Finders, recommending medical admission and will discuss biopsy with the patient. -Keep n.p.o. after midnight -Hold pharmacologic VTE prophylaxis -No further glucocorticoids which would decrease biopsy yield

## 2022-06-16 NOTE — ED Triage Notes (Signed)
Pt has lost 20-30 lbs since July, she states a nurse told her that she was going to become diabetic and pt stopped eating. Pt is rambling in triage and states she feels foggy.

## 2022-06-16 NOTE — ED Provider Notes (Signed)
[facility] EMERGENCY DEPT Provider Note   CSN: [id number] Arrival date & time: 06/16/22  1030     History Chief Complaint  Patient presents with   Neurologic Problem    Murrell Elizondo is a 76 y.o. female otherwise healthy presents the emergency room for evaluation of executive dysfunction, confusion, cognitive decline for the past 2-1/2 months.  Her son is at bedside and gives the majority of the history.  He reports that she had a spider bite to her abdomen in end of June, beginning of July in which she was put on doxycycline.  He reports that after this she had a significant downtrend in her cognitive ability.  He reports that she went to urgent care because she kept having a repeated sweet taste in her mouth.  She reports that some nurse made a comment about her being diabetic possibly, and the patient was scared about this so stopped eating.  She lost 30 pounds because of this.  Son says that she has been reluctant to seek medical care, and she was amenable to coming in today which is why they are here.  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  Additionally, he mentions that she was having some hallucinations while on the doxycycline but has not had any since.  He denies any falls that he is aware of.  The patient denies any falls.  She denies any headaches, blurry visions, abdominal pain, chest pain, shortness of breath, dysuria, or hematuria.  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   Neurologic Problem Pertinent negatives include no chest pain, no headaches and no shortness of breath.       Home Medications Prior to Admission medications   Not on File      Allergies    Codeine and Penicillins     Review of Systems   Review of Systems  Constitutional:  Negative for chills and fever.  Eyes:  Negative for visual disturbance.  Respiratory:  Negative for shortness of breath.   Cardiovascular:  Negative for chest pain.  Gastrointestinal:  Negative for anal bleeding, nausea and vomiting.  Genitourinary:  Negative for dysuria and hematuria.  Musculoskeletal:  Negative for neck pain.  Neurological:  Negative for headaches.    Physical Exam Updated Vital Signs BP (!) 146/68 (BP Location: Right Arm)   Pulse 60   Temp 98.1 F (36.7 C) (Oral)   Resp 16   SpO2 99%  Physical Exam Vitals and nursing note reviewed.  Constitutional:      General: She is not in acute distress.    Appearance: Normal appearance. She is not ill-appearing or toxic-appearing.     Comments: Pleasant  HENT:     Head: Normocephalic and atraumatic.  Eyes:     General: No scleral icterus.    Extraocular Movements: Extraocular movements intact.     Pupils: Pupils are equal, round, and reactive to light.  Cardiovascular:     Rate and Rhythm: Normal rate and regular rhythm.  Pulmonary:     Effort: Pulmonary effort is normal. No respiratory distress.     Breath sounds: Normal breath sounds.  Abdominal:     General: Bowel sounds are normal.     Palpations:  Abdomen is soft.     Tenderness: There is no abdominal tenderness. There is no guarding or rebound.  Musculoskeletal:        General: No deformity.     Cervical back: Normal range of motion and neck supple. No rigidity or tenderness.  Skin:    General: Skin is warm and dry.  Neurological:     General: No focal deficit present.     Mental Status: She is alert and oriented to person, place, and time.     Cranial Nerves: No cranial nerve deficit.     Sensory: No sensory deficit.     Motor: No weakness.     Comments: Patient is alert and oriented to person, place, and time.  She is answering questions appropriately, however does get confused with talking  to you or with answering questions.  Possible thought delay or comprehension issues.  She has equal strength in her upper and lower bilateral extremities.  Sensation intact throughout.  She has normal finger-nose and normal rapid altering movements.     ED Results / Procedures / Treatments   Labs (all labs ordered are listed, but only abnormal results are displayed) Labs Reviewed  COMPREHENSIVE METABOLIC PANEL - Abnormal; Notable for the following components:      Result Value   Glucose, Bld 104 (*)    Alkaline Phosphatase 32 (*)    All other components within normal limits  CBG MONITORING, ED - Abnormal; Notable for the following components:   Glucose-Capillary 115 (*)    All other components within normal limits  URINE CULTURE  CBC WITH DIFFERENTIAL/PLATELET  URINALYSIS, ROUTINE W REFLEX MICROSCOPIC  AMMONIA  TSH  MAGNESIUM  T3, FREE  T4, FREE    EKG None  Radiology CT CHEST ABDOMEN PELVIS W CONTRAST  Result Date: 06/16/2022 CLINICAL DATA:  Brain metastatic disease. 20-30 pound weight loss since July. EXAM: CT CHEST, ABDOMEN, AND PELVIS WITH CONTRAST TECHNIQUE: Multidetector CT imaging of the chest, abdomen and pelvis was performed following the standard protocol during bolus administration of intravenous contrast. RADIATION DOSE REDUCTION: This exam was performed according to the departmental dose-optimization program which includes automated exposure control, adjustment of the mA and/or kV according to patient size and/or use of iterative reconstruction technique. CONTRAST:  29m OMNIPAQUE IOHEXOL 300 MG/ML  SOLN COMPARISON:  None Available. FINDINGS: CT CHEST FINDINGS Cardiovascular: Heart normal in size and configuration. No pericardial effusion. Great vessels are normal in caliber. No thoracic aortic dissection or significant atherosclerosis. Arch branch vessels are widely patent. Mediastinum/Nodes: Normal thyroid. No neck base, mediastinal or hilar masses or enlarged lymph  nodes. Trachea and esophagus are unremarkable. Lungs/Pleura: Mild peripheral interstitial thickening. Mild apical scarring. 3 mm subpleural nodule, left lower lobe, image 112, series 4. No other nodules. No masses. No evidence of pneumonia or pulmonary edema. No pleural effusion or pneumothorax. Musculoskeletal: No fracture or acute finding. No osteoblastic or osteolytic lesions. No chest wall mass. CT ABDOMEN PELVIS FINDINGS Hepatobiliary: No focal liver abnormality is seen. No gallstones, gallbladder wall thickening, or biliary dilatation. Pancreas: Unremarkable. No pancreatic ductal dilatation or surrounding inflammatory changes. Spleen: Normal in size without focal abnormality. Adrenals/Urinary Tract: Normal adrenal glands. Kidneys normal in size, orientation and position with symmetric enhancement and excretion. 3-4 mm low-attenuation lesion, midpole of the right kidney, too small to characterize but consistent with a cyst. No follow-up recommended. No other renal masses or lesions, no stones and no hydronephrosis. Normal ureters. Normal bladder. Stomach/Bowel: Normal stomach. Small bowel and colon  are normal in caliber. No wall thickening. No evidence of a mass. No inflammation. Normal appendix visualized. Vascular/Lymphatic: Minor aortic atherosclerosis. No aneurysm. No enlarged lymph nodes. Reproductive: Uterus and bilateral adnexa are unremarkable. Other: No abdominal wall hernia or abnormality. No abdominopelvic ascites. Musculoskeletal: Mild compression fractures of L1 and L2, unclear chronicity, most likely chronic. No bone lesion. IMPRESSION: 1. No acute findings within the chest, abdomen or pelvis. 2. No evidence of a primary malignancy. No convincing metastatic disease within the chest, abdomen and pelvis. 3 mm left lower lobe pulmonary nodule, most likely benign. Recommend attention on follow-up imaging. 3. Mild compression fractures of L1 and L2, of unclear chronicity, but likely chronic.  Electronically Signed   By: Lajean Manes M.D.   On: 06/16/2022 15:34   CT Head Wo Contrast  Result Date: 06/16/2022 CLINICAL DATA:  Provided history: Mental status change, unknown cause. Intermittent confusion and word-finding difficulty. EXAM: CT HEAD WITHOUT CONTRAST TECHNIQUE: Contiguous axial images were obtained from the base of the skull through the vertex without intravenous contrast. RADIATION DOSE REDUCTION: This exam was performed according to the departmental dose-optimization program which includes automated exposure control, adjustment of the mA and/or kV according to patient size and/or use of iterative reconstruction technique. COMPARISON:  No pertinent prior exams available for comparison. FINDINGS: Brain: 12 mm hyperdense focus in the left temporoparietal white matter with surrounding edema (for instance as seen on series 2, image 15). Ill-defined focus of hyperdensity more laterally along the posterior left temporal lobe, measuring 15 mm (for instance as seen on series 2, image 14) (series 4, image 23). Apparent loss of gray-white differentiation within portions of the anterolateral left frontal lobe and left insula. Mass effect arising from the left cerebral hemisphere with partial effacement of the left lateral ventricle and 5 mm rightward midline shift. Background mild patchy and ill-defined hypoattenuation within the cerebral white matter, nonspecific but compatible chronic small vessel disease. Vascular: No hyperdense vessel.  Atherosclerotic calcifications. Skull: No fracture or aggressive osseous lesion. Sinuses/Orbits: No mass or acute finding within the imaged orbits. No significant paranasal sinus disease at the imaged levels. These results were called by telephone at the time of interpretation on 06/16/2022 at 2:12 pm to provider Traivon Morrical , who verbally acknowledged these results. IMPRESSION: 12 mm focus of hyperdensity within the left temporoparietal white matter with  surrounding edema in the left cerebral hemisphere. Additionally, there is loss of gray-white differentiation within portions of the anterolateral left temporal lobe and left insula. Although nonspecific, this constellation of findings could potentially reflect a hemorrhagic mass or a recent infarct with hemorrhagic conversion. Associated mass effect with partial effacement of the left lateral ventricle and 5 mm rightward midline shift. A brain MRI without and with contrast is recommended for further characterization. Additional 15 mm focus of hyperdensity along the posterior left temporal lobe, which may reflect acute parenchymal or subarachnoid hemorrhage or other hyperdense lesion. Background mild chronic small vessel image changes within the cerebral white matter. Electronically Signed   By: Kellie Simmering D.O.   On: 06/16/2022 14:14    Procedures Procedures    Medications Ordered in ED Medications  dexamethasone (DECADRON) injection 10 mg (10 mg Intravenous Given 06/16/22 1436)  iohexol (OMNIPAQUE) 300 MG/ML solution 100 mL (75 mLs Intravenous Contrast Given 06/16/22 1457)    ED Course/ Medical Decision Making/ A&P  Medical Decision Making Amount and/or Complexity of Data Reviewed Labs: ordered. Radiology: ordered.  Risk Prescription drug management. Decision regarding hospitalization.    76 year old female presents the emergency room and for evaluation of cognitive decline, executive dysfunction for the past 2-1/2 months.  Differential diagnosis includes limited to UTI, electrolyte abnormality, TSH abnormality, dementia, encephalopathy, malignancy.  Vital signs show mildly elevated blood pressure otherwise unremarkable.  Physical exam as noted above.  Given the patient's symptoms, will order extensive blood work and CT imaging of the head.  I independently reviewed and interpreted the patient's labs.  Ammonia within normal limits.  TSH within normal limits.   Free T4 within normal limits.  Magnesium normal.  Urinalysis unremarkable.  CBC without leukocytosis or anemia.  CMP shows mildly low glucose at 104 although patient not fasting.  Mild decrease in alk phos at 32 otherwise no electrolyte or LFT abnormalities.  CT of the head shows  12 mm focus of hyperdensity within the left temporoparietal white matter with surrounding edema in the left cerebral hemisphere. Additionally, there is loss of gray-white differentiation within portions of the anterolateral left temporal lobe and left insula. Although nonspecific, this constellation of findings could potentially reflect a hemorrhagic mass or a recent infarct with hemorrhagic conversion. Associated mass effect with partial effacement of the left lateral ventricle and 5 mm rightward midline shift. A brain MRI without and with contrast is recommended for further characterization. Additional 15 mm focus of hyperdensity along the posterior left temporal lobe, which may reflect acute parenchymal or subarachnoid hemorrhage or other hyperdense lesion. Background mild chronic small vessel image changes within the cerebral white matter.  Given the CT findings, I did consult neurosurgery and spoke with Dr. Christella Noa.  He recommended doing IV Decadron and also recommended doing a CT of the chest abdomen pelvis with contrast to look for possible primary cancer.  IV Decadron ordered.  CT of the chest abdomen pelvis shows  IMPRESSION: 1. No acute findings within the chest, abdomen or pelvis. 2. No evidence of a primary malignancy. No convincing metastatic disease within the chest, abdomen and pelvis. 3 mm left lower lobe pulmonary nodule, most likely benign. Recommend attention on follow-up imaging. 3. Mild compression fractures of L1 and L2, of unclear chronicity, but likely chronic.  Discussed with patient and son at bedside plan to be transferred over to [facility] for MRI.  Patient and son are amenable.  ED to ED. Dr. Mayra Neer at  [facility].   I discussed this case with my attending physician who cosigned this note including patient's presenting symptoms, physical exam, and planned diagnostics and interventions. Attending physician stated agreement with plan or made changes to plan which were implemented.   Attending physician assessed patient at bedside.  Final Clinical Impression(s) / ED Diagnoses Final diagnoses:  Brain mass    Rx / DC Orders ED Discharge Orders     None         Sherrell Puller, PA-C 06/16/22 2220    Regan Lemming, MD 06/16/22 2306

## 2022-06-16 NOTE — ED Notes (Signed)
Patient reports she has no pain.  She was not able to eat all of her food.  Patient denies need for a sleep aid at this time

## 2022-06-16 NOTE — ED Notes (Signed)
Patient is alert and oriented at this time.  She denies any complaints of pain.  Son is at her bedside and reports this is the first time she has been willing to seek help.  Patient up to the bathroom without assistance.

## 2022-06-17 ENCOUNTER — Inpatient Hospital Stay (HOSPITAL_COMMUNITY): Payer: Medicare Other

## 2022-06-17 DIAGNOSIS — D496 Neoplasm of unspecified behavior of brain: Secondary | ICD-10-CM | POA: Diagnosis not present

## 2022-06-17 LAB — URINE CULTURE: Culture: NO GROWTH

## 2022-06-17 LAB — CBC
HCT: 41.9 % (ref 36.0–46.0)
Hemoglobin: 13.6 g/dL (ref 12.0–15.0)
MCH: 32.4 pg (ref 26.0–34.0)
MCHC: 32.5 g/dL (ref 30.0–36.0)
MCV: 99.8 fL (ref 80.0–100.0)
Platelets: 241 10*3/uL (ref 150–400)
RBC: 4.2 MIL/uL (ref 3.87–5.11)
RDW: 13.1 % (ref 11.5–15.5)
WBC: 9.1 10*3/uL (ref 4.0–10.5)
nRBC: 0 % (ref 0.0–0.2)

## 2022-06-17 LAB — BASIC METABOLIC PANEL
Anion gap: 13 (ref 5–15)
BUN: 13 mg/dL (ref 8–23)
CO2: 18 mmol/L — ABNORMAL LOW (ref 22–32)
Calcium: 9.5 mg/dL (ref 8.9–10.3)
Chloride: 106 mmol/L (ref 98–111)
Creatinine, Ser: 0.7 mg/dL (ref 0.44–1.00)
GFR, Estimated: >60 mL/min (ref >60)
Glucose, Bld: 154 mg/dL — ABNORMAL HIGH (ref 70–99)
Potassium: 4 mmol/L (ref 3.5–5.1)
Sodium: 137 mmol/L (ref 135–145)

## 2022-06-17 LAB — PROTIME-INR
INR: 1 (ref 0.8–1.2)
Prothrombin Time: 12.9 seconds (ref 11.4–15.2)

## 2022-06-17 MED ORDER — HYDRALAZINE HCL 25 MG PO TABS: 25.0000 mg | ORAL_TABLET | Freq: Four times a day (QID) | ORAL | Status: DC | PRN

## 2022-06-17 NOTE — Consult Note (Signed)
Neurosurgery Consultation  Reason for Consult: Brain tumor Referring Physician: Posey Pronto  CC: Dysphasia  HPI: This is a 76 y.o. woman that presents with a month of progressive speech difficulty. She reports reception being intact, no headaches, no N/V, no h/o seizures or aura. No new weakness, numbness, or parasthesias, no recent change in bowel or bladder function. No recent use of anti-platelet or anti-coagulant medications.   ROS: A 14 point ROS was performed and is negative except as noted in the HPI.   PMHx: History reviewed. No pertinent past medical history. FamHx:  Family History  Family history unknown: Yes   SocHx:  reports that she has never smoked. She has never used smokeless tobacco. She reports that she does not drink alcohol and does not use drugs.  Exam: Vital signs in last 24 hours: Temp:  [97.6 F (36.4 C)-98.3 F (36.8 C)] 97.6 F (36.4 C) (10/01 2307) Pulse Rate:  [57-77] 77 (10/01 2307) Resp:  [13-19] 16 (10/01 2307) BP: (125-156)/(67-87) 144/79 (10/01 2307) SpO2:  [97 %-100 %] 100 % (10/01 2307) Weight:  [56.7 kg] 56.7 kg (10/01 1802) General: Awake, alert, cooperative, lying in bed in NAD Head: Normocephalic and atruamatic HEENT: Neck supple Pulmonary: breathing room air comfortably, no evidence of increased work of breathing Cardiac: RRR Abdomen: S NT ND Extremities: Warm and well perfused x4 Neuro: AOx3, PERRL, EOMI, FS Speech with WFD, paraphasic errors, partially intact reception Strength 5/5 x4, SILTx4, no drift   Assessment and Plan: 76 y.o. woman with progressive aphasia, but global but predominantly receptive. 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   -hold steroids -ok for diet today -discussed w/ the patient, will d/w family later today, awaiting confirmation from the OR but plan for likely biopsy  tomorrow -please call with any concerns or questions  Judith Part, MD 06/17/22 8:15 AM [facility]

## 2022-06-17 NOTE — Progress Notes (Signed)
[facility]  PROGRESS NOTE  Miranda Martinez WSF:[id number] DOB: December 24, 1945 DOA: 06/16/2022 PCP: [name], DO   Brief HPI:   76 year old female with no significant medical history came to ED for evaluation of several months of confusion, word finding difficulty.  Patient says that her end of June she developed new word fine difficulties and expressive aphasia.  She also has been having difficulty with math and numbers. In the ED CT head showed 12 mm focus of hypodensity within the left temporoparietal white matter with surrounding edema in the left cerebral hemisphere. CT chest abdomen/pelvis with contrast was negative for acute finding.  No evidence of primary malignancy or convincing metastatic disease.  It did show mild compression fracture of L1-L2 of unclear chronicity. 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  Associated mass effect and 7 mm of left-to-right midline shift but no evidence of hydrocephalus. Neurosurgery consulted    Subjective   Patient seen and examined, denies new complaints.   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 after midnight -Plan for biopsy in a.m.  Elevated blood pressure -No previous history of hypertension -Start hydralazine 25 mg p.o. every 6 hours as needed for BP more than 160/100  Medications      Data Reviewed:   CBG:  Recent Labs  Lab 06/16/22 1303  GLUCAP 115*    SpO2: 100 %     Vitals:   06/16/22 1530 06/16/22 1843 06/16/22 1943 06/16/22 2307  BP: 130/67 (!) 151/78 (!) 155/79 (!) 144/79  Pulse: (!) 58 70 71 77  Resp: '16 13 19 16  '$ Temp:  98 F (36.7 C)  97.6 F (36.4 C)  TempSrc:  Oral  Oral  SpO2: 99% 97% 100% 100%      Data Reviewed:  Basic Metabolic Panel: Recent Labs  Lab 06/16/22 1249 06/17/22 0316  NA 138 137  K 3.9 4.0  CL 105 106  CO2 24 18*  GLUCOSE 104* 154*  BUN 21 13  CREATININE 0.73 0.70  CALCIUM 9.4 9.5  MG 2.1  --     CBC: Recent Labs  Lab 06/16/22 1249 06/17/22 0316  WBC 5.8 9.1  NEUTROABS 3.4  --   HGB 13.1 13.6  HCT 38.9 41.9  MCV 97.0 99.8  PLT 236 241    LFT Recent Labs  Lab 06/16/22 1249  AST 18  ALT 12  ALKPHOS 32*  BILITOT 0.5  PROT 6.8  ALBUMIN 4.0     Antibiotics: Anti-infectives (From admission, onward)    None        DVT prophylaxis: SCDs  Code Status: Full code  Family Communication: No family at bedside   CONSULTS neurosurgery   Objective    Physical Examination:   General: Appears in no acute distress Cardiovascular: S1-S2, regular, no murmur auscultated Respiratory: Clear to auscultation bilaterally Abdomen: Abdomen is soft, nontender, no organomegaly Extremities: No edema in the lower extremities Neurologic: Alert, oriented x3, moving all extremities, mild receptive aphasia   Status is: Inpatient:  [name]   [facility] If 7PM-7AM, please contact night-coverage at [email or web address], Office  [phone]   06/17/2022, 8:39 AM  LOS: 1 day

## 2022-06-18 ENCOUNTER — Encounter (HOSPITAL_COMMUNITY): Payer: Self-pay | Admitting: Internal Medicine

## 2022-06-18 ENCOUNTER — Other Ambulatory Visit: Payer: Self-pay | Admitting: Radiation Therapy

## 2022-06-18 ENCOUNTER — Encounter (HOSPITAL_COMMUNITY)
Admission: EM | Disposition: A | Discharge status: Home Health | Payer: Self-pay | Source: Home / Self Care | Attending: Neurological Surgery

## 2022-06-18 ENCOUNTER — Other Ambulatory Visit: Payer: Self-pay

## 2022-06-18 ENCOUNTER — Inpatient Hospital Stay (HOSPITAL_COMMUNITY): Payer: Medicare Other

## 2022-06-18 ENCOUNTER — Inpatient Hospital Stay (HOSPITAL_COMMUNITY): Payer: Medicare Other | Admitting: Anesthesiology

## 2022-06-18 DIAGNOSIS — G9389 Other specified disorders of brain: Secondary | ICD-10-CM

## 2022-06-18 DIAGNOSIS — D496 Neoplasm of unspecified behavior of brain: Secondary | ICD-10-CM

## 2022-06-18 HISTORY — PX: PR DURAL GRAFT REPAIR,SPINE DEFECT: 63710

## 2022-06-18 HISTORY — PX: APPLICATION OF CRANIAL NAVIGATION: SHX6578

## 2022-06-18 LAB — SURGICAL PCR SCREEN
MRSA, PCR: NEGATIVE
Staphylococcus aureus: NEGATIVE

## 2022-06-18 LAB — T3, FREE: T3, Free: 2.8 pg/mL (ref 2.0–4.4)

## 2022-06-18 SURGERY — FRAMELESS STEREOTACTIC BIOPSY
Anesthesia: General | Site: Head | Laterality: Left

## 2022-06-18 MED ORDER — ACETAMINOPHEN 10 MG/ML IV SOLN: 1000.0000 mg | Freq: Once | INTRAVENOUS | Status: DC | PRN

## 2022-06-18 MED ORDER — ONDANSETRON HCL 4 MG/2ML IJ SOLN
INTRAMUSCULAR | Status: DC | PRN
  Administered 2022-06-18: 4 mg via INTRAVENOUS

## 2022-06-18 MED ORDER — LABETALOL HCL 5 MG/ML IV SOLN
10.0000 mg | INTRAVENOUS | Status: DC | PRN
  Administered 2022-06-18 (×2): 10 mg via INTRAVENOUS
  Filled 2022-06-18 (×2): qty 8

## 2022-06-18 MED ORDER — ACETAMINOPHEN 160 MG/5ML PO SOLN: 325.0000 mg | ORAL | Status: DC | PRN

## 2022-06-18 MED ORDER — THROMBIN 20000 UNITS EX SOLR
CUTANEOUS | Status: AC
  Filled 2022-06-18: qty 20000

## 2022-06-18 MED ORDER — BACITRACIN ZINC 500 UNIT/GM EX OINT
TOPICAL_OINTMENT | CUTANEOUS | Status: AC
  Filled 2022-06-18: qty 28.35

## 2022-06-18 MED ORDER — PROPOFOL 10 MG/ML IV BOLUS
INTRAVENOUS | Status: DC | PRN
  Administered 2022-06-18: 50 mg via INTRAVENOUS
  Administered 2022-06-18: 90 mg via INTRAVENOUS
  Administered 2022-06-18: 60 mg via INTRAVENOUS

## 2022-06-18 MED ORDER — ONDANSETRON HCL 4 MG/2ML IJ SOLN: 4.0000 mg | INTRAMUSCULAR | Status: DC | PRN

## 2022-06-18 MED ORDER — PHENYLEPHRINE HCL-NACL 20-0.9 MG/250ML-% IV SOLN
INTRAVENOUS | Status: DC | PRN
  Administered 2022-06-18: 20 ug/min via INTRAVENOUS

## 2022-06-18 MED ORDER — ROCURONIUM BROMIDE 10 MG/ML (PF) SYRINGE
PREFILLED_SYRINGE | INTRAVENOUS | Status: DC | PRN
  Administered 2022-06-18: 60 mg via INTRAVENOUS

## 2022-06-18 MED ORDER — HEPARIN SODIUM (PORCINE) 5000 UNIT/ML IJ SOLN
5000.0000 [IU] | Freq: Three times a day (TID) | INTRAMUSCULAR | Status: DC
  Administered 2022-06-20 – 2022-06-21 (×4): 5000 [IU] via SUBCUTANEOUS
  Filled 2022-06-18 (×4): qty 1

## 2022-06-18 MED ORDER — FENTANYL CITRATE (PF) 250 MCG/5ML IJ SOLN
INTRAMUSCULAR | Status: DC | PRN
  Administered 2022-06-18 (×2): 50 ug via INTRAVENOUS

## 2022-06-18 MED ORDER — FENTANYL CITRATE (PF) 100 MCG/2ML IJ SOLN: 25.0000 ug | INTRAMUSCULAR | Status: DC | PRN

## 2022-06-18 MED ORDER — PHENYLEPHRINE 80 MCG/ML (10ML) SYRINGE FOR IV PUSH (FOR BLOOD PRESSURE SUPPORT)
PREFILLED_SYRINGE | INTRAVENOUS | Status: AC
  Filled 2022-06-18: qty 10

## 2022-06-18 MED ORDER — SUGAMMADEX SODIUM 200 MG/2ML IV SOLN
INTRAVENOUS | Status: DC | PRN
  Administered 2022-06-18: 200 mg via INTRAVENOUS

## 2022-06-18 MED ORDER — AMISULPRIDE (ANTIEMETIC) 5 MG/2ML IV SOLN: 10.0000 mg | Freq: Once | INTRAVENOUS | Status: DC | PRN

## 2022-06-18 MED ORDER — POLYETHYLENE GLYCOL 3350 17 G PO PACK: 17.0000 g | PACK | Freq: Every day | ORAL | Status: DC | PRN

## 2022-06-18 MED ORDER — LIDOCAINE-EPINEPHRINE 1 %-1:100000 IJ SOLN
INTRAMUSCULAR | Status: DC | PRN
  Administered 2022-06-18: 6 mL

## 2022-06-18 MED ORDER — ACETAMINOPHEN 325 MG PO TABS: 325.0000 mg | ORAL_TABLET | ORAL | Status: DC | PRN

## 2022-06-18 MED ORDER — LABETALOL HCL 5 MG/ML IV SOLN
INTRAVENOUS | Status: AC
  Filled 2022-06-18: qty 4

## 2022-06-18 MED ORDER — CLEVIDIPINE BUTYRATE 0.5 MG/ML IV EMUL
0.0000 mg/h | INTRAVENOUS | Status: DC
  Administered 2022-06-18: 2 mg/h via INTRAVENOUS
  Administered 2022-06-18: 4 mg/h via INTRAVENOUS

## 2022-06-18 MED ORDER — CHLORHEXIDINE GLUCONATE CLOTH 2 % EX PADS
6.0000 | MEDICATED_PAD | Freq: Every day | CUTANEOUS | Status: DC
  Administered 2022-06-18 – 2022-06-20 (×3): 6 via TOPICAL

## 2022-06-18 MED ORDER — DOCUSATE SODIUM 100 MG PO CAPS
100.0000 mg | ORAL_CAPSULE | Freq: Two times a day (BID) | ORAL | Status: DC
  Administered 2022-06-19 – 2022-06-21 (×5): 100 mg via ORAL
  Filled 2022-06-18 (×4): qty 1

## 2022-06-18 MED ORDER — CEFAZOLIN SODIUM-DEXTROSE 2-4 GM/100ML-% IV SOLN
2.0000 g | Freq: Three times a day (TID) | INTRAVENOUS | Status: AC
  Administered 2022-06-18 – 2022-06-19 (×2): 2 g via INTRAVENOUS
  Filled 2022-06-18 (×2): qty 100

## 2022-06-18 MED ORDER — HYDROCODONE-ACETAMINOPHEN 5-325 MG PO TABS: 1.0000 | ORAL_TABLET | ORAL | Status: DC | PRN

## 2022-06-18 MED ORDER — PHENYLEPHRINE 80 MCG/ML (10ML) SYRINGE FOR IV PUSH (FOR BLOOD PRESSURE SUPPORT)
PREFILLED_SYRINGE | INTRAVENOUS | Status: DC | PRN
  Administered 2022-06-18 (×3): 80 ug via INTRAVENOUS

## 2022-06-18 MED ORDER — SODIUM CHLORIDE 0.9 % IV SOLN: INTRAVENOUS | Status: DC

## 2022-06-18 MED ORDER — CLEVIDIPINE BUTYRATE 0.5 MG/ML IV EMUL
INTRAVENOUS | Status: AC
  Filled 2022-06-18: qty 50

## 2022-06-18 MED ORDER — MUPIROCIN 2 % EX OINT
1.0000 | TOPICAL_OINTMENT | Freq: Two times a day (BID) | CUTANEOUS | Status: DC
  Administered 2022-06-18 – 2022-06-21 (×5): 1 via NASAL
  Filled 2022-06-18 (×3): qty 22

## 2022-06-18 MED ORDER — CEFAZOLIN SODIUM-DEXTROSE 2-3 GM-%(50ML) IV SOLR
INTRAVENOUS | Status: DC | PRN
  Administered 2022-06-18: 2 g via INTRAVENOUS

## 2022-06-18 MED ORDER — LIDOCAINE-EPINEPHRINE 1 %-1:100000 IJ SOLN
INTRAMUSCULAR | Status: AC
  Filled 2022-06-18: qty 1

## 2022-06-18 MED ORDER — ACETAMINOPHEN 650 MG RE SUPP: 650.0000 mg | RECTAL | Status: DC | PRN

## 2022-06-18 MED ORDER — HYDROMORPHONE HCL 1 MG/ML IJ SOLN: 0.5000 mg | INTRAMUSCULAR | Status: DC | PRN

## 2022-06-18 MED ORDER — FENTANYL CITRATE (PF) 250 MCG/5ML IJ SOLN
INTRAMUSCULAR | Status: AC
  Filled 2022-06-18: qty 5

## 2022-06-18 MED ORDER — 0.9 % SODIUM CHLORIDE (POUR BTL) OPTIME
TOPICAL | Status: DC | PRN
  Administered 2022-06-18: 1000 mL

## 2022-06-18 MED ORDER — PROPOFOL 10 MG/ML IV BOLUS
INTRAVENOUS | Status: AC
  Filled 2022-06-18: qty 20

## 2022-06-18 MED ORDER — ORAL CARE MOUTH RINSE: 15.0000 mL | Freq: Once | OROMUCOSAL | Status: AC

## 2022-06-18 MED ORDER — BACITRACIN ZINC 500 UNIT/GM EX OINT
TOPICAL_OINTMENT | CUTANEOUS | Status: DC | PRN
  Administered 2022-06-18: 1 via TOPICAL

## 2022-06-18 MED ORDER — ACETAMINOPHEN 325 MG PO TABS: 650.0000 mg | ORAL_TABLET | ORAL | Status: DC | PRN

## 2022-06-18 MED ORDER — LACTATED RINGERS IV SOLN: INTRAVENOUS | Status: DC | PRN

## 2022-06-18 MED ORDER — LIDOCAINE 2% (20 MG/ML) 5 ML SYRINGE
INTRAMUSCULAR | Status: DC | PRN
  Administered 2022-06-18: 40 mg via INTRAVENOUS

## 2022-06-18 MED ORDER — PROMETHAZINE HCL 12.5 MG PO TABS: 12.5000 mg | ORAL_TABLET | ORAL | Status: DC | PRN

## 2022-06-18 MED ORDER — CHLORHEXIDINE GLUCONATE 0.12 % MT SOLN
OROMUCOSAL | Status: AC
  Administered 2022-06-18: 15 mL via OROMUCOSAL
  Filled 2022-06-18: qty 15

## 2022-06-18 MED ORDER — ONDANSETRON HCL 4 MG PO TABS: 4.0000 mg | ORAL_TABLET | ORAL | Status: DC | PRN

## 2022-06-18 MED ORDER — DEXAMETHASONE SODIUM PHOSPHATE 10 MG/ML IJ SOLN
10.0000 mg | Freq: Once | INTRAMUSCULAR | Status: AC
  Administered 2022-06-18: 10 mg via INTRAVENOUS
  Filled 2022-06-18: qty 1

## 2022-06-18 MED ORDER — DEXAMETHASONE SODIUM PHOSPHATE 4 MG/ML IJ SOLN: 4.0000 mg | Freq: Two times a day (BID) | INTRAMUSCULAR | Status: DC

## 2022-06-18 MED ORDER — PROMETHAZINE HCL 25 MG/ML IJ SOLN: 6.2500 mg | INTRAMUSCULAR | Status: DC | PRN

## 2022-06-18 MED ORDER — LIDOCAINE 2% (20 MG/ML) 5 ML SYRINGE
INTRAMUSCULAR | Status: AC
  Filled 2022-06-18: qty 5

## 2022-06-18 MED ORDER — CHLORHEXIDINE GLUCONATE 0.12 % MT SOLN: 15.0000 mL | Freq: Once | OROMUCOSAL | Status: AC

## 2022-06-18 MED ORDER — ROCURONIUM BROMIDE 10 MG/ML (PF) SYRINGE
PREFILLED_SYRINGE | INTRAVENOUS | Status: AC
  Filled 2022-06-18: qty 10

## 2022-06-18 SURGICAL SUPPLY — 86 items
BAG COUNTER SPONGE SURGICOUNT (BAG) ×2 IMPLANT
BAND RUBBER #18 3X1/16 STRL (MISCELLANEOUS) IMPLANT
BENZOIN TINCTURE PRP APPL 2/3 (GAUZE/BANDAGES/DRESSINGS) IMPLANT
BLADE CLIPPER SURG (BLADE) ×2 IMPLANT
BLADE SAW GIGLI 16 STRL (MISCELLANEOUS) IMPLANT
BLADE SURG 15 STRL LF DISP TIS (BLADE) IMPLANT
BLADE SURG 15 STRL SS (BLADE)
BNDG GAUZE DERMACEA FLUFF 4 (GAUZE/BANDAGES/DRESSINGS) IMPLANT
BNDG STRETCH 4X75 STRL LF (GAUZE/BANDAGES/DRESSINGS) IMPLANT
BUR ACORN 9.0 PRECISION (BURR) ×2 IMPLANT
BUR ROUND PRECISION 4.0 (BURR) IMPLANT
BUR SPIRAL ROUTER 2.3 (BUR) ×2 IMPLANT
CANISTER SUCT 3000ML PPV (MISCELLANEOUS) ×4 IMPLANT
CATH VENTRIC 35X38 W/TROCAR LG (CATHETERS) IMPLANT
CLIP VESOCCLUDE MED 6/CT (CLIP) IMPLANT
CNTNR URN SCR LID CUP LEK RST (MISCELLANEOUS) ×2 IMPLANT
CONT SPEC 4OZ STRL OR WHT (MISCELLANEOUS) ×2
COVER MAYO STAND STRL (DRAPES) IMPLANT
COVERAGE SUPPORT O-ARM STEALTH (MISCELLANEOUS) ×2 IMPLANT
DRAIN SUBARACHNOID (WOUND CARE) IMPLANT
DRAPE HALF SHEET 40X57 (DRAPES) ×2 IMPLANT
DRAPE MICROSCOPE SLANT 54X150 (MISCELLANEOUS) IMPLANT
DRAPE NEUROLOGICAL W/INCISE (DRAPES) ×2 IMPLANT
DRAPE STERI IOBAN 125X83 (DRAPES) IMPLANT
DRAPE SURG 17X23 STRL (DRAPES) IMPLANT
DRAPE WARM FLUID 44X44 (DRAPES) ×2 IMPLANT
DRSG ADAPTIC 3X8 NADH LF (GAUZE/BANDAGES/DRESSINGS) IMPLANT
DRSG TELFA 3X8 NADH STRL (GAUZE/BANDAGES/DRESSINGS) IMPLANT
DURAPREP 6ML APPLICATOR 50/CS (WOUND CARE) ×2 IMPLANT
ELECT REM PT RETURN 9FT ADLT (ELECTROSURGICAL) ×2
ELECTRODE REM PT RTRN 9FT ADLT (ELECTROSURGICAL) ×2 IMPLANT
EVACUATOR 1/8 PVC DRAIN (DRAIN) IMPLANT
EVACUATOR SILICONE 100CC (DRAIN) IMPLANT
FEE COVERAGE SUPPORT O-ARM (MISCELLANEOUS) IMPLANT
FORCEPS BIPOLAR SPETZLER 8 1.0 (NEUROSURGERY SUPPLIES) ×2 IMPLANT
GAUZE 4X4 16PLY ~~LOC~~+RFID DBL (SPONGE) IMPLANT
GAUZE SPONGE 4X4 12PLY STRL (GAUZE/BANDAGES/DRESSINGS) IMPLANT
GLOVE BIO SURGEON STRL SZ7 (GLOVE) IMPLANT
GLOVE BIOGEL PI IND STRL 7.0 (GLOVE) IMPLANT
GLOVE BIOGEL PI IND STRL 7.5 (GLOVE) ×2 IMPLANT
GLOVE ECLIPSE 7.5 STRL STRAW (GLOVE) ×2 IMPLANT
GLOVE EXAM NITRILE LRG STRL (GLOVE) IMPLANT
GLOVE EXAM NITRILE XS STR PU (GLOVE) IMPLANT
GOWN STRL REUS W/ TWL LRG LVL3 (GOWN DISPOSABLE) ×4 IMPLANT
GOWN STRL REUS W/ TWL XL LVL3 (GOWN DISPOSABLE) IMPLANT
GOWN STRL REUS W/TWL 2XL LVL3 (GOWN DISPOSABLE) IMPLANT
GOWN STRL REUS W/TWL LRG LVL3 (GOWN DISPOSABLE) ×4
GOWN STRL REUS W/TWL XL LVL3 (GOWN DISPOSABLE)
HEMOSTAT POWDER KIT SURGIFOAM (HEMOSTASIS) ×2 IMPLANT
HEMOSTAT SURGICEL 2X14 (HEMOSTASIS) ×2 IMPLANT
IV NS 1000ML (IV SOLUTION) ×2
IV NS 1000ML BAXH (IV SOLUTION) ×2 IMPLANT
KIT BASIN OR (CUSTOM PROCEDURE TRAY) ×2 IMPLANT
KIT DRAIN CSF ACCUDRAIN (MISCELLANEOUS) IMPLANT
KIT TURNOVER KIT B (KITS) ×2 IMPLANT
MARKER SPHERE PSV REFLC NDI (MISCELLANEOUS) ×6 IMPLANT
NDL SPNL 18GX3.5 QUINCKE PK (NEEDLE) IMPLANT
NEEDLE HYPO 22GX1.5 SAFETY (NEEDLE) ×2 IMPLANT
NEEDLE SPNL 18GX3.5 QUINCKE PK (NEEDLE) IMPLANT
NS IRRIG 1000ML POUR BTL (IV SOLUTION) ×6 IMPLANT
PACK CRANIOTOMY CUSTOM (CUSTOM PROCEDURE TRAY) ×2 IMPLANT
PATTIES SURGICAL .25X.25 (GAUZE/BANDAGES/DRESSINGS) IMPLANT
PATTIES SURGICAL .5 X.5 (GAUZE/BANDAGES/DRESSINGS) IMPLANT
PATTIES SURGICAL .5 X3 (DISPOSABLE) IMPLANT
PATTIES SURGICAL 1/4 X 3 (GAUZE/BANDAGES/DRESSINGS) IMPLANT
PATTIES SURGICAL 1X1 (DISPOSABLE) IMPLANT
PIN MAYFIELD SKULL DISP (PIN) ×2 IMPLANT
SPECIMEN JAR SMALL (MISCELLANEOUS) IMPLANT
SPIKE FLUID TRANSFER (MISCELLANEOUS) ×2 IMPLANT
SPONGE NEURO XRAY DETECT 1X3 (DISPOSABLE) IMPLANT
SPONGE SURGIFOAM ABS GEL 100 (HEMOSTASIS) ×2 IMPLANT
STAPLER VISISTAT 35W (STAPLE) ×2 IMPLANT
SUT ETHILON 3 0 FSL (SUTURE) IMPLANT
SUT ETHILON 3 0 PS 1 (SUTURE) IMPLANT
SUT MNCRL AB 3-0 PS2 18 (SUTURE) IMPLANT
SUT MON AB 3-0 SH 27 (SUTURE)
SUT MON AB 3-0 SH27 (SUTURE) IMPLANT
SUT NURALON 4 0 TR CR/8 (SUTURE) ×6 IMPLANT
SUT SILK 0 TIES 10X30 (SUTURE) IMPLANT
SUT VIC AB 2-0 CP2 18 (SUTURE) ×2 IMPLANT
TOWEL GREEN STERILE (TOWEL DISPOSABLE) ×2 IMPLANT
TOWEL GREEN STERILE FF (TOWEL DISPOSABLE) ×2 IMPLANT
TRAY FOLEY MTR SLVR 16FR STAT (SET/KITS/TRAYS/PACK) ×2 IMPLANT
TUBE CONNECTING 12X1/4 (SUCTIONS) ×2 IMPLANT
UNDERPAD 30X36 HEAVY ABSORB (UNDERPADS AND DIAPERS) ×2 IMPLANT
WATER STERILE IRR 1000ML POUR (IV SOLUTION) ×2 IMPLANT

## 2022-06-18 NOTE — Anesthesia Procedure Notes (Addendum)
Procedure Name: Intubation Date/Time: 06/18/2022 11:17 AM  Performed by: Erick Colace, CRNAPre-anesthesia Checklist: Patient identified, Emergency Drugs available, Suction available and Patient being monitored Patient Re-evaluated:Patient Re-evaluated prior to induction Oxygen Delivery Method: Circle system utilized Preoxygenation: Pre-oxygenation with 100% oxygen Induction Type: IV induction Ventilation: Mask ventilation without difficulty Laryngoscope Size: Mac and 4 Grade View: Grade II Tube type: Oral Tube size: 7.0 mm Number of attempts: 1 Airway Equipment and Method: Stylet Placement Confirmation: ETT inserted through vocal cords under direct vision, positive ETCO2 and breath sounds checked- equal and bilateral Secured at: 21 cm Tube secured with: Tape Dental Injury: Teeth and Oropharynx as per pre-operative assessment

## 2022-06-18 NOTE — Op Note (Signed)
PATIENT: Miranda Martinez  DAY OF SURGERY: 06/18/22   PRE-OPERATIVE DIAGNOSIS:  Brain tumor   POST-OPERATIVE DIAGNOSIS:  Same   PROCEDURE:  Left burr hole for stereotactic brain biopsy   SURGEON:  Surgeon(s) and Role:    Judith Part, MD - Primary      ANESTHESIA: ETGA   BRIEF HISTORY: This is a 76 year old woman who presented with predominantly language deficits. MRI showed diffuse left hemispheric edema with two areas of diffusion changes that were concerning for potential neoplasm. I discussed this with the patient and her son, recommended a biopsy of the left temporal abnormality to guide treatment along with the risks and benefits of surgery.   OPERATIVE DETAIL: The patient was taken to the operating room and placed on the OR table in the supine position. A formal time out was performed with two patient identifiers and confirmed the operative site. Anesthesia was induced by the anesthesia team. The [place] head holder was applied to the head and a registration array was attached to the [place]. This was co-registered with the patient's preoperative imaging, the fit appeared to be acceptable. Using frameless stereotaxy, the operative trajectory was planned and the incision was marked. Hair was clipped with surgical clippers over the incision and the area was then prepped and draped in a sterile fashion.  A linear incision was placed in the posterior left temporal region. A burr hole was created with a high speed drill and rongeurs, dura was coagulated and opened, and landmarks were checked with stereotaxy with good fit and good exposure of the biopsy region. Labbe was identified the curve of Labbe was used to confirm landmarks stereotactically. I A roughly 1cm corticotomy was made sharply and a biopsy was taken of the area and sent to pathology. Hemostasis was confirmed and the incision was closed in layers. All instrument and sponge counts were correct. The patient was then returned to  anesthesia for emergence. No apparent complications at the completion of the procedure.   EBL:  56m   DRAINS: none   SPECIMENS: Left temporal brain biopsy   TJudith Part MD 06/18/22 11:07 AM

## 2022-06-18 NOTE — Progress Notes (Signed)
Neurosurgery Service Progress Note  Subjective: No acute events overnight, no new complaints   Objective: Vitals:   06/17/22 2022 06/18/22 0036 06/18/22 0502 06/18/22 0814  BP: 136/74 (!) 143/72 123/77 132/67  Pulse: 60 (!) 49 (!) 55 61  Resp: '17 17 17 16  '$ Temp: 97.7 F (36.5 C) (!) 97.4 F (36.3 C) 97.6 F (36.4 C) 98 F (36.7 C)  TempSrc: Oral Oral Oral Oral  SpO2: 100% 99% 99% 100%    Physical Exam: AOx3, PERRL, EOMI, FS Speech with WFD, paraphasic errors, partially intact reception Strength 5/5 x4, SILTx4, no drift  Assessment & Plan: 76 y.o. left handed woman with dysphasia, MRI with L hemispheric edema and two locations of diffusion change.  -OR today for biopsy  Judith Part  06/18/22 10:11 AM

## 2022-06-18 NOTE — Plan of Care (Addendum)
Pt is alert and oriented x 4. Has expressive aphasia. Pt refused to sign consent for biopsy. Stating she wants to speak with MD again. She allowed to do CHG bath but refused to change clothes into hospital gown. Has been NPO prior to MN and MRSA swab sent and negative. Preprocedure consent started. Dr. Joaquim Nam sent a msg this am making aware of consent refusal and requesting a meeting this am.  Problem: Education: Goal: Knowledge of General Education information will improve Description: Including pain rating scale, medication(s)/side effects and non-pharmacologic comfort measures Outcome: Progressing   Problem: Health Behavior/Discharge Planning: Goal: Ability to manage health-related needs will improve Outcome: Progressing   Problem: Clinical Measurements: Goal: Ability to maintain clinical measurements within normal limits will improve Outcome: Progressing Goal: Will remain free from infection Outcome: Progressing Goal: Diagnostic test results will improve Outcome: Progressing Goal: Respiratory complications will improve Outcome: Progressing Goal: Cardiovascular complication will be avoided Outcome: Progressing   Problem: Activity: Goal: Risk for activity intolerance will decrease Outcome: Progressing   Problem: Nutrition: Goal: Adequate nutrition will be maintained Outcome: Progressing   Problem: Coping: Goal: Level of anxiety will decrease Outcome: Progressing   Problem: Elimination: Goal: Will not experience complications related to bowel motility Outcome: Progressing Goal: Will not experience complications related to urinary retention Outcome: Progressing   Problem: Pain Managment: Goal: General experience of comfort will improve Outcome: Progressing   Problem: Safety: Goal: Ability to remain free from injury will improve Outcome: Progressing   Problem: Skin Integrity: Goal: Risk for impaired skin integrity will decrease Outcome: Progressing

## 2022-06-18 NOTE — Anesthesia Preprocedure Evaluation (Addendum)
Anesthesia Evaluation  Patient identified by MRN, date of birth, ID band Patient awake    Reviewed: Allergy & Precautions, NPO status , Patient's Chart, lab work & pertinent test results  Airway Mallampati: II  TM Distance: <3 FB Neck ROM: Full    Dental  (+) Teeth Intact, Dental Advisory Given   Pulmonary neg pulmonary ROS,    breath sounds clear to auscultation       Cardiovascular negative cardio ROS   Rhythm:Regular Rate:Normal     Neuro/Psych negative neurological ROS  negative psych ROS   GI/Hepatic negative GI ROS, Neg liver ROS,   Endo/Other    Renal/GU negative Renal ROS     Musculoskeletal negative musculoskeletal ROS (+)   Abdominal Normal abdominal exam  (+)   Peds  Hematology negative hematology ROS (+)   Anesthesia Other Findings   Reproductive/Obstetrics                            Anesthesia Physical Anesthesia Plan  ASA: 3  Anesthesia Plan: General   Post-op Pain Management:    Induction: Intravenous  PONV Risk Score and Plan: 4 or greater and Ondansetron, Treatment may vary due to age or medical condition and Dexamethasone  Airway Management Planned: Oral ETT  Additional Equipment: Arterial line  Intra-op Plan:   Post-operative Plan: Extubation in OR  Informed Consent: I have reviewed the patients History and Physical, chart, labs and discussed the procedure including the risks, benefits and alternatives for the proposed anesthesia with the patient or authorized representative who has indicated his/her understanding and acceptance.     Dental advisory given  Plan Discussed with: CRNA  Anesthesia Plan Comments: (- 2 IV's)      Anesthesia Quick Evaluation

## 2022-06-18 NOTE — Anesthesia Procedure Notes (Addendum)
Arterial Line Insertion Start/End10/11/2021 11:17 AM, 06/18/2022 11:20 AM Performed by: Effie Berkshire, MD, anesthesiologist  Patient location: OR. Preanesthetic checklist: patient identified, IV checked, site marked, risks and benefits discussed, surgical consent, monitors and equipment checked, pre-op evaluation, timeout performed and anesthesia consent Lidocaine 1% used for infiltration Left, radial was placed Catheter size: 20 G Hand hygiene performed  and maximum sterile barriers used   Attempts: 1 Procedure performed without using ultrasound guided technique. Following insertion, dressing applied and Biopatch. Post procedure assessment: normal and unchanged  Patient tolerated the procedure well with no immediate complications.

## 2022-06-18 NOTE — Transfer of Care (Signed)
Immediate Anesthesia Transfer of Care Note  Patient: Isac Sarna  Procedure(s) Performed: FRAMELESS STEREOTACTIC BIOPSY WITH STEALTH (Left: Head) APPLICATION OF CRANIAL NAVIGATION (Left)  Patient Location: PACU  Anesthesia Type:General  Level of Consciousness: awake  Airway & Oxygen Therapy: Patient Spontanous Breathing and Patient connected to face mask oxygen  Post-op Assessment: Report given to RN and Post -op Vital signs reviewed and stable  Post vital signs: Reviewed and stable  Last Vitals:  Vitals Value Taken Time  BP 146/69 06/18/22 1251  Temp    Pulse 69 06/18/22 1254  Resp 13 06/18/22 1254  SpO2 100 % 06/18/22 1254  Vitals shown include unvalidated device data.  Last Pain:  Vitals:   06/18/22 1027  TempSrc: Oral  PainSc:          Complications: No notable events documented.

## 2022-06-18 NOTE — Progress Notes (Signed)
[facility]  PROGRESS NOTE  Miranda Martinez EXB:[id number] DOB: 1946/08/29 DOA: 06/16/2022 PCP: Miranda Margarita, DO   Brief HPI:   76 year old female with no significant medical history came to ED for evaluation of several months of confusion, word finding difficulty.  Patient says that her end of June she developed new word fine difficulties and expressive aphasia.  She also has been having difficulty with math and numbers. In the ED CT head showed 12 mm focus of hypodensity within the left temporoparietal white matter with surrounding edema in the left cerebral hemisphere. CT chest abdomen/pelvis with contrast was negative for acute finding.  No evidence of primary malignancy or convincing metastatic disease.  It did show mild compression fracture of L1-L2 of unclear chronicity. 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  Associated mass effect and 7 mm of left-to-right midline shift but no evidence of hydrocephalus. 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   Elevated blood pressure -No previous history of hypertension -Start hydralazine 25 mg p.o. every 6 hours as needed for BP more than 160/100  Medications     dexamethasone (DECADRON) injection  10 mg Intravenous Once   [START ON 06/19/2022] dexamethasone (DECADRON) injection  4 mg Intravenous Q12H   docusate sodium  100 mg Oral BID   [START ON 06/20/2022]  heparin injection (subcutaneous)  5,000 Units Subcutaneous Q8H   labetalol       [MAR Hold] mupirocin ointment  1 Application Nasal BID     Data Reviewed:   CBG:  Recent Labs  Lab 06/16/22 1303  GLUCAP 115*    SpO2: 100 %    Vitals:   06/18/22 1615 06/18/22 1630 06/18/22 1645 06/18/22 1700  BP: (!) 140/73 (!) 146/66 (!) 107/55 (!) 106/57  Pulse: 62 65 70 68  Resp: '17 15 19 13  '$ Temp:      TempSrc:      SpO2: 100% 99% 99% 100%  Weight:      Height:          Data Reviewed:  Basic Metabolic Panel: Recent Labs  Lab 06/16/22 1249 06/17/22 0316  NA 138 137  K 3.9 4.0  CL 105 106  CO2 24 18*  GLUCOSE 104* 154*  BUN 21 13  CREATININE 0.73 0.70  CALCIUM 9.4 9.5  MG 2.1  --     CBC: Recent Labs  Lab 06/16/22 1249 06/17/22 0316  WBC 5.8 9.1  NEUTROABS 3.4  --   HGB 13.1 13.6  HCT 38.9 41.9  MCV 97.0 99.8  PLT 236 241    LFT Recent Labs  Lab 06/16/22 1249  AST 18  ALT 12  ALKPHOS 32*  BILITOT 0.5  PROT 6.8  ALBUMIN 4.0     Antibiotics: Anti-infectives (From admission, onward)    Start     Dose/Rate Route Frequency Ordered Stop   06/18/22 1615  ceFAZolin (ANCEF) IVPB 2g/100 mL premix        2 g 200 mL/hr over 30 Minutes Intravenous  Every 8 hours 06/18/22 1613 06/19/22 0814        DVT prophylaxis: SCDs  Code Status: Full code  Family Communication: No family at bedside   CONSULTS neurosurgery   Objective    Physical Examination:   General-appears in no acute distress Heart-S1-S2, regular, no murmur auscultated Lungs-clear to auscultation bilaterally, no wheezing or crackles auscultated Abdomen-soft, nontender, no organomegaly Extremities-no edema in the lower extremities Neuro-alert, oriented x3, no focal deficit noted   Status is: Inpatient:          [name]   [facility] If 7PM-7AM, please contact night-coverage at [email or web address], Office  [phone]   06/18/2022, 5:30 PM  LOS: 2 days

## 2022-06-19 ENCOUNTER — Encounter (HOSPITAL_COMMUNITY): Payer: Self-pay | Admitting: Neurological Surgery

## 2022-06-19 DIAGNOSIS — D496 Neoplasm of unspecified behavior of brain: Secondary | ICD-10-CM | POA: Diagnosis not present

## 2022-06-19 LAB — BASIC METABOLIC PANEL
Anion gap: 11 (ref 5–15)
BUN: 12 mg/dL (ref 8–23)
CO2: 23 mmol/L (ref 22–32)
Calcium: 9 mg/dL (ref 8.9–10.3)
Chloride: 105 mmol/L (ref 98–111)
Creatinine, Ser: 0.7 mg/dL (ref 0.44–1.00)
GFR, Estimated: >60 mL/min (ref >60)
Glucose, Bld: 157 mg/dL — ABNORMAL HIGH (ref 70–99)
Potassium: 4.1 mmol/L (ref 3.5–5.1)
Sodium: 139 mmol/L (ref 135–145)

## 2022-06-19 LAB — CBC
HCT: 37.5 % (ref 36.0–46.0)
Hemoglobin: 12.8 g/dL (ref 12.0–15.0)
MCH: 33.4 pg (ref 26.0–34.0)
MCHC: 34.1 g/dL (ref 30.0–36.0)
MCV: 97.9 fL (ref 80.0–100.0)
Platelets: 228 10*3/uL (ref 150–400)
RBC: 3.83 MIL/uL — ABNORMAL LOW (ref 3.87–5.11)
RDW: 13.2 % (ref 11.5–15.5)
WBC: 8.4 10*3/uL (ref 4.0–10.5)
nRBC: 0 % (ref 0.0–0.2)

## 2022-06-19 MED ORDER — DEXAMETHASONE SODIUM PHOSPHATE 10 MG/ML IJ SOLN
10.0000 mg | Freq: Four times a day (QID) | INTRAMUSCULAR | Status: DC
  Administered 2022-06-19 – 2022-06-21 (×9): 10 mg via INTRAVENOUS
  Filled 2022-06-19 (×14): qty 1

## 2022-06-19 NOTE — Evaluation (Signed)
Occupational Therapy Evaluation Patient Details Name: Miranda Martinez MRN: [id number] DOB: 1946/06/09 Today's Date: 06/19/2022   History of Present Illness 76 year old came to ED 10/1 for evaluation of several months of confusion, word finding difficulty and difficulty with math and numbers. MRI showed diffuse left hemispheric edema with two areas of diffusion changes that were concerning for potential neoplasm. 10/3 underwent Left burr hole for stereotactic brain biopsy. No significatn PMH.   Clinical Impression   Pt's son called for PLOF as pt unable to communicate information at this time. Per son, his Mom has been having difficulty with speech and completing IADL tasks beginning in June 2023. Son states his Mom has lost "34 lbs" since June because she has stopped eating well. Son states she has had difficulty managing her finances and doesn't complete routine tasks, I.e. laundry. In addition to increased difficulty with speech postop, pt appears to have deficits with attention and motor planning. Pt apparently frustrated with the difficulty she was having with tasks. Discussed with son the need for 24/7 S after DC, assistance with all IADL tasks and the importance of refraining from driving.Son verbalized understanding. Recommend follow up with HHOT to maximize functional level of independence within her home.  VSS. Acute OT to follow.      Recommendations for follow up therapy are one component of a multi-disciplinary discharge planning process, led by the attending physician.  Recommendations may be updated based on patient status, additional functional criteria and insurance authorization.   Follow Up Recommendations  Home health OT    Assistance Recommended at Discharge Frequent or constant Supervision/Assistance  Patient can return home with the following A little help with bathing/dressing/bathroom;Assistance with cooking/housework;Direct supervision/assist for medications management;Direct  supervision/assist for financial management;Assist for transportation    Functional Status Assessment  Patient has had a recent decline in their functional status and demonstrates the ability to make significant improvements in function in a reasonable and predictable amount of time.  Equipment Recommendations  None recommended by OT    Recommendations for Other Services       Precautions / Restrictions Precautions Precautions: Fall Precaution Comments: expressive aphasia Restrictions Weight Bearing Restrictions: No      Mobility Bed Mobility Overal bed mobility: Needs Assistance Bed Mobility: Supine to Sit     Supine to sit: Supervision          Transfers Overall transfer level: Needs assistance   Transfers: Sit to/from Stand Sit to Stand: Min guard           General transfer comment: minG for safety, no overt LOB      Balance Overall balance assessment: Mild deficits observed, not formally tested                                         ADL either performed or assessed with clinical judgement   ADL Overall ADL's : Needs assistance/impaired     Grooming: Supervision/safety;Set up;Standing   Upper Body Bathing: Supervision/ safety;Set up;Standing   Lower Body Bathing: Set up;Supervison/ safety;Sit to/from stand   Upper Body Dressing : Supervision/safety;Set up;Sitting       Toilet Transfer: Min guard;Ambulation   Toileting- Clothing Manipulation and Hygiene: Supervision/safety       Functional mobility during ADLs: Min guard       Vision Baseline Vision/History:  (will further assess funcitonally; conjugate gaze wears reading glasses; appears intact)  Perception Perception Comments: appears intact   Praxis      Pertinent Vitals/Pain Pain Assessment Pain Assessment: Faces Faces Pain Scale: No hurt     Hand Dominance Left   Extremity/Trunk Assessment Upper Extremity Assessment Upper Extremity Assessment:  Overall WFL for tasks assessed   Lower Extremity Assessment Lower Extremity Assessment: Defer to PT evaluation   Cervical / Trunk Assessment Cervical / Trunk Assessment: Normal   Communication Communication Communication: Expressive difficulties   Cognition Arousal/Alertness: Awake/alert Behavior During Therapy: Anxious, Impulsive Overall Cognitive Status: Difficult to assess                                 General Comments: difficult to assess due to expressive difficulties. pt with apparent word-finding difficulty; inconsistent with command following; decreased awareness; unsure of receptive ability;after being giving instrucitons to brush her teeth, pt began to walk out of the bathroom; when redirected, able to sequence activity however initially tried to squeeze toothpaste without removing top; given instructions to place top back onto soap and pt instead threw it into the trash     General Comments  VSS with all mobility    Exercises     Shoulder Instructions      Home Living Family/patient expects to be discharged to:: Private residence Living Arrangements: Alone Available Help at Discharge: Available 24 hours/day Type of Home: House Home Access: Stairs to enter [employer] of Steps: 2 Entrance Stairs-Rails: Right Home Layout: Two level;Bed/bath upstairs;1/2 bath on main level     Bathroom Shower/Tub: Teacher, early years/pre: Standard Bathroom Accessibility: Yes How Accessible: Accessible via walker Home Equipment: None   Additional Comments: from family, pt unable to answer in session      Prior Functioning/Environment Prior Level of Function : Independent/Modified Independent;Driving                        OT Problem List: Decreased cognition;Decreased safety awareness      OT Treatment/Interventions: Self-care/ADL training;Therapeutic exercise;DME and/or AE instruction;Therapeutic activities;Cognitive  remediation/compensation;Visual/perceptual remediation/compensation;Patient/family education;Balance training    OT Goals(Current goals can be found in the care plan section) Acute Rehab OT Goals Patient Stated Goal: son's goal is to provide what his Mom needs; pt unable to verbally state goal OT Goal Formulation: With patient/family Time For Goal Achievement: 07/03/22 Potential to Achieve Goals: Good  OT Frequency: Min 2X/week    Co-evaluation              AM-PAC OT "6 Clicks" Daily Activity     Outcome Measure Help from another person eating meals?: None Help from another person taking care of personal grooming?: A Little Help from another person toileting, which includes using toliet, bedpan, or urinal?: A Little Help from another person bathing (including washing, rinsing, drying)?: A Little Help from another person to put on and taking off regular upper body clothing?: A Little Help from another person to put on and taking off regular lower body clothing?: A Little 6 Click Score: 19   End of Session Equipment Utilized During Treatment: Gait belt Nurse Communication: Mobility status;Other (comment) (DC needs)  Activity Tolerance: Patient tolerated treatment well Patient left: Other (comment) (walking with PT)  OT Visit Diagnosis: Unsteadiness on feet (R26.81);Other symptoms and signs involving the nervous system (R29.898);Other symptoms and signs involving cognitive function;Cognitive communication deficit (R41.841)  Time: 5331-7409 OT Time Calculation (min): 31 min Charges:  OT General Charges $OT Visit: 1 Visit OT Evaluation $OT Eval Moderate Complexity: 1 Mod OT Treatments $Self Care/Home Management : 8-22 mins  Maurie Boettcher, OT/L   Acute OT Clinical Specialist Acute Rehabilitation Services Pager [phone] Office [phone]   [facility] 06/19/2022, 2:00 PM

## 2022-06-19 NOTE — Progress Notes (Signed)
[facility]  PROGRESS NOTE  Miranda Martinez XTK:[id number] DOB: 03-20-1946 DOA: 06/16/2022 PCP: Miranda Margarita, DO   Brief HPI:   76 year old female with no significant medical history came to ED for evaluation of several months of confusion, word finding difficulty.  Patient says that her end of June she developed new word fine difficulties and expressive aphasia.  In the ED CT head showed 12 mm focus of hypodensity within the left temporoparietal white matter with surrounding edema in the left cerebral hemisphere. CT chest abdomen/pelvis with contrast was negative for acute finding.  No evidence of primary malignancy or convincing metastatic disease.  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 -S/p brain biopsy 10/3-- plan per NS -PT eval  Elevated blood pressure -No previous history of hypertension -BP low end of normal today  Medications     Chlorhexidine Gluconate Cloth  6 each Topical Daily   dexamethasone (DECADRON) injection  10 mg Intravenous Q6H   docusate sodium  100 mg Oral BID   [START ON 06/20/2022] heparin injection (subcutaneous)  5,000 Units Subcutaneous Q8H   mupirocin ointment  1 Application Nasal BID     Data Reviewed:   CBG:  Recent Labs  Lab 06/16/22 1303  GLUCAP 115*    SpO2: 97 %    Vitals:   06/19/22 0500 06/19/22 0600 06/19/22 0700 06/19/22 0800  BP: (!) 97/52 112/62 (!) 114/58 (!) 105/50  Pulse: (!) 53 (!) 57 60 69  Resp: '16 12 13 14  '$ Temp:    98.2 F (36.8 C)  TempSrc:    Oral  SpO2: 95% 97% 98% 97%  Weight:      Height:          Data Reviewed:  Basic Metabolic Panel: Recent Labs  Lab 06/16/22 1249 06/17/22 0316  06/19/22 0538  NA 138 137 139  K 3.9 4.0 4.1  CL 105 106 105  CO2 24 18* 23  GLUCOSE 104* 154* 157*  BUN '21 13 12  '$ CREATININE 0.73 0.70 0.70  CALCIUM 9.4 9.5 9.0  MG 2.1  --   --     CBC: Recent Labs  Lab 06/16/22 1249 06/17/22 0316 06/19/22 0538  WBC 5.8 9.1 8.4  NEUTROABS 3.4  --   --   HGB 13.1 13.6 12.8  HCT 38.9 41.9 37.5  MCV 97.0 99.8 97.9  PLT 236 241 228    LFT Recent Labs  Lab 06/16/22 1249  AST 18  ALT 12  ALKPHOS 32*  BILITOT 0.5  PROT 6.8  ALBUMIN 4.0      DVT prophylaxis: SCDs  Code Status: Full code  Family Communication: No family at bedside   CONSULTS neurosurgery   Objective    Physical Examination:   Expressive aphasia Pleasant Rrr No increased work of breathing   Status is: Inpatient:          Miranda Martinez   [facility] If 7PM-7AM, please contact night-coverage at [email or web address],    06/19/2022, 9:55 AM  LOS: 3 days

## 2022-06-19 NOTE — Progress Notes (Addendum)
Neurosurgery Service Progress Note  Subjective: No acute events overnight, speech worse post-op but otherwise no new issues, off clevidipine  Objective: Vitals:   06/19/22 0300 06/19/22 0400 06/19/22 0500 06/19/22 0600  BP: 102/64 (!) 96/49 (!) 97/52 112/62  Pulse: (!) 54 (!) 53 (!) 53 (!) 57  Resp: '14 16 16 12  '$ Temp:  81.1 F (36.8 C)    TempSrc:  Oral    SpO2: 95% 95% 95% 97%  Weight:      Height:        Physical Exam: AOx3, PERRL, EOMI, FS Speech more focally wernicke's today with significant paraphasic errors, intact prosody, intact reception Strength 5/5 x4, SILTx4, no drift  Assessment & Plan: 76 y.o. left handed woman with dysphasia, MRI with L hemispheric edema and two locations of diffusion change. 10/3 s/p stereotactic brain biopsy, 10/3 post-op vCTH with post-op changes  -post-op CT shows good position of biopsy region -high dose steroids, will keep inpatient for IV steroids -SCDs/TEDs, SQH POD2 -PT/OT/SLP -transfer to floor -d/c foley, d/c A-line  Marcello Moores A Amaani Guilbault  06/19/22 7:01 AM

## 2022-06-19 NOTE — Evaluation (Signed)
Physical Therapy Evaluation Patient Details Name: Miranda Martinez MRN: [id number] DOB: 1946/09/09 Today's Date: 06/19/2022  History of Present Illness  76 year old came to ED 10/1 for evaluation of several months of confusion, word finding difficulty and difficulty with math and numbers. MRI showed diffuse left hemispheric edema with two areas of diffusion changes that were concerning for potential neoplasm. 10/3 underwent Left burr hole for stereotactic brain biopsy. No significatn PMH.   Clinical Impression  Pt in bed upon arrival of PT, agreeable to evaluation at this time. Prior to admission the pt was living alone, still driving, and walking without DME. However, family reports that over last 6 months she has slowly stopped completing ADLs and IADLs such as cooking, eating, and laundry. The pt now presents with limitations in functional mobility, cognition, and awareness due to above dx, and will continue to benefit from skilled PT to address these deficits. The pt was able to complete transfers and hallway ambulation without physical assistance, but was unable to identify her room number, read signs in hallway, or find the way back to he room despite max cues. She did have single LOB while looking around in hallway needing minA to recover but is generally stable with gait. She will be safe to return home only if frequent/constant supervision is provided as pt is not currently showing any insight to deficits and has limited problem solving and awareness. Will continue to benefit from skilled PT acutely and following d/c to maximize safety with mobility after return home.         Recommendations for follow up therapy are one component of a multi-disciplinary discharge planning process, led by the attending physician.  Recommendations may be updated based on patient status, additional functional criteria and insurance authorization.  Follow Up Recommendations Home health PT (only if frequent/constant  supervision provided)      Assistance Recommended at Discharge Frequent or constant Supervision/Assistance  Patient can return home with the following  A little help with walking and/or transfers;A little help with bathing/dressing/bathroom;Assistance with cooking/housework;Direct supervision/assist for medications management;Direct supervision/assist for financial management;Assist for transportation;Help with stairs or ramp for entrance    Equipment Recommendations None recommended by PT  Recommendations for Other Services       Functional Status Assessment Patient has had a recent decline in their functional status and demonstrates the ability to make significant improvements in function in a reasonable and predictable amount of time.     Precautions / Restrictions Precautions Precautions: Fall Precaution Comments: expressive aphasia Restrictions Weight Bearing Restrictions: No      Mobility  Bed Mobility               General bed mobility comments: pt OOB at start and end of session    Transfers Overall transfer level: Needs assistance   Transfers: Sit to/from Stand Sit to Stand: Min guard           General transfer comment: minG for safety, no overt LOB    Ambulation/Gait Ambulation/Gait assistance: Min guard Gait Distance (Feet): 150 Feet (+ 182f) Assistive device: None Gait Pattern/deviations: Step-through pattern, Decreased stride length Gait velocity: decreased Gait velocity interpretation: 1.31 - 2.62 ft/sec, indicative of limited community ambulator   General Gait Details: generally stable but unable to find her way back to room without max cues. Reports unable to read room signs. single LOB needing minA to correct  Stairs Stairs: Yes Stairs assistance: Min guard Stair Management: One rail Right, Alternating pattern, Forwards Number of Stairs: 3  General stair comments: minG with single rail    Balance Overall balance assessment: Mild  deficits observed, not formally tested                                           Pertinent Vitals/Pain Pain Assessment Pain Assessment: Faces Faces Pain Scale: No hurt    Home Living Family/patient expects to be discharged to:: Private residence Living Arrangements: Alone Available Help at Discharge: Available 24 hours/day Type of Home: House Home Access: Stairs to enter Entrance Stairs-Rails: Right Entrance Stairs-Number of Steps: 2   Home Layout: Two level;Bed/bath upstairs;1/2 bath on main level Home Equipment: None Additional Comments: from family, pt unable to answer in session    Prior Function Prior Level of Function : Independent/Modified Independent;Driving                     Hand Dominance   Dominant Hand: Left    Extremity/Trunk Assessment   Upper Extremity Assessment Upper Extremity Assessment: Defer to OT evaluation    Lower Extremity Assessment Lower Extremity Assessment: Overall WFL for tasks assessed;Difficult to assess due to impaired cognition (functional with movement, difficult to test sensation and MMT due to poor command following)    Cervical / Trunk Assessment Cervical / Trunk Assessment: Normal  Communication   Communication: Expressive difficulties  Cognition Arousal/Alertness: Awake/alert Behavior During Therapy: Restless, WFL for tasks assessed/performed Overall Cognitive Status: Difficult to assess                                 General Comments: difficult to assess due to expressive difficulties. pt seems to have word-finding difficulty and is inconsistent with command following which makes me question receptive deficits as well. perseverating on specific concepts such as room number through session        General Comments General comments (skin integrity, edema, etc.): VSS with all mobility        Assessment/Plan    PT Assessment Patient needs continued PT services  PT Problem List  Decreased balance;Decreased cognition;Decreased safety awareness       PT Treatment Interventions DME instruction;Gait training;Stair training;Functional mobility training;Therapeutic activities;Therapeutic exercise;Balance training;Neuromuscular re-education;Cognitive remediation;Patient/family education    PT Goals (Current goals can be found in the Care Plan section)  Acute Rehab PT Goals Patient Stated Goal: none stated PT Goal Formulation: With patient Time For Goal Achievement: 07/03/22 Potential to Achieve Goals: Good    Frequency Min 3X/week        AM-PAC PT "6 Clicks" Mobility  Outcome Measure Help needed turning from your back to your side while in a flat bed without using bedrails?: A Little Help needed moving from lying on your back to sitting on the side of a flat bed without using bedrails?: A Little Help needed moving to and from a bed to a chair (including a wheelchair)?: A Little Help needed standing up from a chair using your arms (e.g., wheelchair or bedside chair)?: A Little Help needed to walk in hospital room?: A Little Help needed climbing 3-5 steps with a railing? : A Little 6 Click Score: 18    End of Session Equipment Utilized During Treatment: Gait belt Activity Tolerance: Patient tolerated treatment well Patient left: in chair;with call bell/phone within reach;with chair alarm set Nurse Communication: Mobility status PT Visit Diagnosis:  Other abnormalities of gait and mobility (R26.89);Unsteadiness on feet (R26.81)    Time: 7195-9747 PT Time Calculation (min) (ACUTE ONLY): 19 min   Charges:   PT Evaluation $PT Eval Moderate Complexity: 1 Mod          [place] Carbo, PT, DPT   Acute Rehabilitation Department  Sandra Cockayne 06/19/2022, 11:30 AM

## 2022-06-19 NOTE — Anesthesia Postprocedure Evaluation (Signed)
Anesthesia Post Note  Patient: Isac Sarna  Procedure(s) Performed: FRAMELESS STEREOTACTIC BIOPSY WITH STEALTH (Left: Head) APPLICATION OF CRANIAL NAVIGATION (Left)     Patient location during evaluation: PACU Anesthesia Type: General Level of consciousness: awake and alert Pain management: pain level controlled Vital Signs Assessment: post-procedure vital signs reviewed and stable Respiratory status: spontaneous breathing, nonlabored ventilation, respiratory function stable and patient connected to nasal cannula oxygen Cardiovascular status: blood pressure returned to baseline and stable Postop Assessment: no apparent nausea or vomiting Anesthetic complications: no   No notable events documented.              Effie Berkshire

## 2022-06-19 NOTE — Plan of Care (Signed)
  Problem: Clinical Measurements: Goal: Ability to maintain clinical measurements within normal limits will improve Outcome: Progressing   

## 2022-06-20 MED ORDER — DEXAMETHASONE 4 MG PO TABS: 4.0000 mg | ORAL_TABLET | Freq: Two times a day (BID) | ORAL | 2 refills | Status: DC

## 2022-06-20 NOTE — Progress Notes (Signed)
Neurosurgery Service Progress Note  Subjective: No acute events overnight, speech dramatically improved with steroids  Objective: Vitals:   06/19/22 2000 06/19/22 2126 06/20/22 0022 06/20/22 0606  BP: (!) 153/71 (!) 160/84 (!) 142/74 (!) 141/74  Pulse: 79 73 70 63  Resp: (!) 21     Temp: 98.2 F (36.8 C) (!) 97.4 F (36.3 C) (!) 97.5 F (36.4 C) 98.2 F (36.8 C)  TempSrc: Oral Oral Oral Oral  SpO2: 99% 99% 97% 98%  Weight:      Height:        Physical Exam: AOx3, PERRL, EOMI, FS Speech with mild to moderate paraphasic errors which she is aware of, intact prosody, intact reception Strength 5/5 x4, SILTx4, no drift  Assessment & Plan: 76 y.o. left handed woman with dysphasia, MRI with L hemispheric edema and two locations of diffusion change. 10/3 s/p stereotactic brain biopsy, 10/3 post-op vCTH with post-op changes  -prelim path already back, c/f high grade glial neoplasm, final diagnosis / grading pending further IHC/genetic testing  -high dose steroids, will keep inpatient for IV steroids -SCDs/TEDs, SQH -PT/OT HHPTOT, SLP recs pending -can go home today on oral steroids after she works with [employer]  06/20/22 7:04 AM

## 2022-06-20 NOTE — TOC Initial Note (Addendum)
Transition of Care [facility]) - Initial/Assessment Note    Patient Details  Name: Miranda Martinez MRN: [id number] Date of Birth: [birth year]  [facility] CM/SW Contact:    Marilu Favre, RN Phone Number: 06/20/2022, 10:35 AM  Clinical Narrative:                 Spoke to patient at bedside regarding PT/OT recommendations for [facility] and 24 hour supervision ( per PT note discussed with son) . No family present at bedside.   Patient agreed for NCM to call son Rob. Called left voicemail , await call back.   Orders for HHPT,OT,SP  1530 patient's son rob returned call. Discussed HHPT,OT,SP. He is getting patient's home ready and making arrangements today for discharge tomorrow. He will stay with her 24/7 at discharge. He will call NCM tomorrow regarding HHPT,OT,SP. He lives 3 miles from patient and not sure if he wants home health to come to her home or his  Expected Discharge Plan: [place]     Patient Goals and CMS Choice Patient states their goals for this hospitalization and ongoing recovery are:: to return to home CMS Medicare.gov Compare Post Acute Care list provided to:: Patient Choice offered to / list presented to : Patient  Expected Discharge Plan and Services Expected Discharge Plan: [place]   Discharge Planning Services: CM Consult Post Acute Care Choice: [place] arrangements for the past 2 months: Single Family Home Expected Discharge Date: 06/20/22               DME Arranged: N/A DME Agency: NA       HH Arranged: PT, OT, Speech Therapy          Prior Living Arrangements/Services Living arrangements for the past 2 months: Single Family Home Lives with:: Self Patient language and need for interpreter reviewed:: Yes Do you feel safe going back to the place where you live?: Yes      Need for Family Participation in Patient Care: Yes (Comment)     Criminal Activity/Legal Involvement Pertinent to Current  Situation/Hospitalization: No - Comment as needed  Activities of Daily Living      Permission Sought/Granted   Permission granted to share information with : Yes, Verbal Permission Granted  Share Information with NAME: son Lashawndra Lampkins [phone]           Emotional Assessment Appearance:: Appears stated age     Orientation: : Oriented to Self Alcohol / Substance Use: Not Applicable Psych Involvement: No (comment)  Admission diagnosis:  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

## 2022-06-20 NOTE — Progress Notes (Signed)
Pt transferred from [facility]. Alert and oriented to self, expressive aphasia noted. Able to follow directions of staff. Denies pain. Oriented to unit, room and fall precautions plan. Assisted with toileting. No distress noted. Vitals stable.

## 2022-06-20 NOTE — Evaluation (Signed)
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 No pertinent past medical history. Past Surgical History:  Past Surgical History:  Procedure Laterality Date   APPLICATION OF CRANIAL NAVIGATION Left 06/18/2022   Procedure: APPLICATION OF CRANIAL NAVIGATION;  Surgeon: Judith Part, MD;  Location: [facility];  Service: Neurosurgery;  Laterality: Left;   PR DURAL GRAFT REPAIR,SPINE DEFECT Left 06/18/2022   Procedure: FRAMELESS STEREOTACTIC BIOPSY WITH STEALTH;  Surgeon: Judith Part, MD;  Location: [facility];  Service: Neurosurgery;  Laterality: Left;   HPI:  76 year old came to ED 10/1 for evaluation of several months of confusion, word finding difficulty and difficulty with math and numbers. MRI showed diffuse left hemispheric edema with two areas of diffusion changes that were concerning for potential neoplasm. 10/3 underwent Left burr hole for stereotactic brain biopsy. No significant PMH.   Assessment / Plan / Recommendation Clinical Impression  Pt presents with significant receptive and expressive aphasia. She scored 46 on the Bedside-WAB with relatively higher comprehension scores. She does answer simple yes/no questions fairly well (80% accuracy) and does better with one-step, simple commands. Although she is somewhat verbose and expressive language is more fluent, she has overt word-finding difficulties (30% accurate on confrotnational naming) as well as perseverative errors and phonemic paraphasias. She exhibits some awareness of errors, seemingly frustrated while perseverating on the word "red" and stating, "all I can see is red!" She wrote her full name but could not write her full address. At times,  when unable to verbally state a word, she would try to spell it (either out loud or writing on paper/with her finger in the air), but this was not an accurate compensatory strategy.  If she did write a word (which she did x2), she was then able to say it, and she also benefited from sentence completion cues. Recommend that she will need 24/7 supervision upon return home as well as f/u SLP services.    SLP Assessment  SLP Recommendation/Assessment: Patient needs continued Speech [place] Pathology Services SLP Visit Diagnosis: Aphasia (R47.01)    Recommendations for follow up therapy are one component of a multi-disciplinary discharge planning process, led by the attending physician.  Recommendations may be updated based on patient status, additional functional criteria and insurance authorization.    Follow Up Recommendations  Home health SLP    Assistance Recommended at Discharge  Frequent or constant Supervision/Assistance  Functional Status Assessment Patient has had a recent decline in their functional status and demonstrates the ability to make significant improvements in function in a reasonable and predictable amount of time.  Frequency and Duration min 2x/week  2 weeks      SLP Evaluation Cognition  Overall Cognitive Status: Difficult to assess Orientation Level: Oriented to person Attention: Sustained Sustained Attention: Impaired Sustained Attention Impairment: Verbal basic;Verbal complex Awareness: Impaired Awareness Impairment: Emergent impairment (intermittently aware of language difficulties) Safety/Judgment: Impaired       Comprehension  Auditory Comprehension Overall Auditory Comprehension: Impaired Yes/No Questions: Impaired Basic Biographical Questions: 76-100% accurate Basic Immediate Environment Questions: 75-100% accurate Commands: Impaired One Step Basic Commands: 75-100% accurate Two Step Basic Commands: 50-74% accurate Conversation: Simple     Expression Expression Primary Mode of Expression: Verbal Verbal Expression Overall Verbal Expression: Impaired Initiation:  No impairment Automatic Speech: Name;Social Response Level of Generative/Spontaneous Verbalization: Sentence (some clear phrases or short sentences noted) Repetition: Impaired Level of Impairment: Phrase level Naming: Impairment Confrontation: Impaired (30% accuracy) Verbal Errors: Perseveration;Phonemic paraphasias Pragmatics: Impairment Impairments: Turn Taking Effective Techniques: Sentence completion;Written cues Non-Verbal Means of Communication: Not applicable Written Expression Written Expression: Exceptions to [facility] Self Formulation Ability: Word   Oral / Motor  Motor Speech Overall Motor Speech: Appears within functional limits for tasks assessed            Osie Bond., M.A. [place] Office [phone]  Secure chat preferred  06/20/2022, 11:59 AM

## 2022-06-20 NOTE — Discharge Summary (Addendum)
Discharge Summary  Date of Admission: 06/16/2022  Date of Discharge: 06/21/22  Attending Physician: Emelda Brothers, MD  Hospital Course: Patient presented with progressive aphasia. MRI showed diffuse left hemispheric edema with two areas of diffusion restriction, overall concerning for an underlying neoplastic process. She therefore went to the OR on 10/3 for a biopsy of the left middle temporal gyrus abnormality. 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 She was recovered in PACU and transferred to 4N ICU. Post-op CTH showed normal post-op changes with good localization to the left MTG. Her speech was significantly worse post-op, but dramatically improved with steroids. PT/OT/SLP recommended outpatient therapies. Her hospital course was otherwise uncomplicated and the patient was discharged home on 06/21/22. They will follow up in clinic with me in clinic in 2 weeks.  Neurologic exam at discharge:  Strength 5/5 x4 and SILTx4, speech fluent with moderate paraphasic errors, intact comprehension, mild dysgraphia  Discharge diagnosis: Brain tumor  Miranda Part, MD 06/20/22 7:18 AM

## 2022-06-20 NOTE — Plan of Care (Signed)
  Problem: Health Behavior/Discharge Planning: Goal: Ability to manage health-related needs will improve Outcome: Progressing   Problem: Clinical Measurements: Goal: Ability to maintain clinical measurements within normal limits will improve Outcome: Progressing   

## 2022-06-20 NOTE — Care Management Important Message (Signed)
Important Message  Patient Details  Name: Miranda Martinez MRN: [id number] Date of Birth: 1946/01/19   Medicare Important Message Given:  Yes     Hannah Beat 06/20/2022, 2:35 PM

## 2022-06-21 NOTE — Progress Notes (Signed)
Neurosurgery Service Progress Note  Subjective: No acute events overnight, speech continues to be improved / waxes & wanes with fatigue / sleep  Objective: Vitals:   06/20/22 0606 06/20/22 0811 06/20/22 2051 06/21/22 0504  BP: (!) 141/74 (!) 149/78 (!) 161/72 139/76  Pulse: 63 72 63 70  Resp:  '16 20 16  '$ Temp: 98.2 F (36.8 C) 97.8 F (36.6 C) 98.2 F (36.8 C) 98 F (36.7 C)  TempSrc: Oral Oral Oral Oral  SpO2: 98% 98% 100% 96%  Weight:      Height:        Physical Exam: AOx3, PERRL, EOMI, FS Speech with mild to moderate paraphasic errors which she is aware of, intact prosody, intact reception Strength 5/5 x4, SILTx4, no drift  Assessment & Plan: 76 y.o. left handed woman with dysphasia, MRI with L hemispheric edema and two locations of diffusion change. 10/3 s/p stereotactic brain biopsy, 10/3 post-op vCTH with post-op changes  -prelim path already back, c/f high grade glial neoplasm, final diagnosis / grading pending further IHC/genetic testing  -SCDs/TEDs, SQH -PT/OT HHPTOT, SLP HHSLP -can go home today on oral steroids, son is going to provide supervision  Judith Part  06/21/22 8:49 AM

## 2022-06-21 NOTE — Plan of Care (Signed)
  Problem: Clinical Measurements: Goal: Ability to maintain clinical measurements within normal limits will improve Outcome: Progressing Goal: Will remain free from infection Outcome: Progressing   

## 2022-06-21 NOTE — Progress Notes (Signed)
Pt. discharged to home in stable condition. Discharged packet and instruction given to son Jenavie Stanczak

## 2022-06-21 NOTE — Progress Notes (Signed)
Speech Language Pathology Treatment: Cognitive-Linquistic  Patient Details Name: Miranda Martinez MRN: [id number] DOB: Mar 12, 1946 Today's Date: 06/21/2022 Time: 1015-1030 SLP Time Calculation (min) (ACUTE ONLY): 15 min  Assessment / Plan / Recommendation Clinical Impression  Pt was seen upright in her chair, showing mild improvements from previous date but still with persistent expressive more than receptive deficits. Pt attempted automatic speech tasks today, benefiting from [place] verbal cues to initiate as well as pacing cues, as she did better when she was not as rushed. With cueing she counted from 1-20, said the alphabet, and with more help, said the days of the week. Phonemic paraphasias are noted more so than perseveration today. Pt also completed a matching task to work on reading and receptive identification. She needed Mod cues for confrontational naming but then once objects were identified, she was able to match written word to object with Min cues. Continue to recommend [facility] SLP as well as 24/7 supervision upon discharge.    HPI HPI: 76 year old came to ED 10/1 for evaluation of several months of confusion, word finding difficulty and difficulty with math and numbers. MRI showed diffuse left hemispheric edema with two areas of diffusion changes that were concerning for potential neoplasm. 10/3 underwent Left burr hole for stereotactic brain biopsy. No significant PMH.      SLP Plan  Continue with current plan of care      Recommendations for follow up therapy are one component of a multi-disciplinary discharge planning process, led by the attending physician.  Recommendations may be updated based on patient status, additional functional criteria and insurance authorization.    Recommendations                   Follow Up Recommendations: Home health SLP Assistance recommended at discharge: Frequent or constant Supervision/Assistance SLP Visit Diagnosis: Aphasia (R47.01) Plan:  Continue with current plan of care           Osie Bond., M.A. [place] Office [phone]  Secure chat preferred   06/21/2022, 10:34 AM

## 2022-06-21 NOTE — Progress Notes (Addendum)
Occupational Therapy Treatment Patient Details Name: Miranda Martinez MRN: [id number] DOB: [birth year] Today's Date: 06/21/2022   History of present illness 76 year old came to ED 10/1 for evaluation of several months of confusion, word finding difficulty and difficulty with math and numbers. MRI showed diffuse left hemispheric edema with two areas of diffusion changes that were concerning for potential neoplasm. 10/3 underwent Left burr hole for stereotactic brain biopsy. No significatn PMH.   OT comments  Pt. Seen for skilled OT treatment session.  Pt. Able to safely complete bed mobility, ambulation to/from b.room for toileting and LB dressing.  Some intermittent cues secondary to pt. Easily distracted by self and conversation.  Agree with current recommendations for home and level of supervision indicated.    Recommendations for follow up therapy are one component of a multi-disciplinary discharge planning process, led by the attending physician.  Recommendations may be updated based on patient status, additional functional criteria and insurance authorization.    Follow Up Recommendations  Home health OT    Assistance Recommended at Discharge Frequent or constant Supervision/Assistance  Patient can return home with the following  A little help with bathing/dressing/bathroom;Assistance with cooking/housework;Direct supervision/assist for medications management;Direct supervision/assist for financial management;Assist for transportation   Equipment Recommendations       Recommendations for Other Services      Precautions / Restrictions Precautions Precautions: Fall Precaution Comments: expressive aphasia       Mobility Bed Mobility Overal bed mobility: Needs Assistance Bed Mobility: Supine to Sit     Supine to sit: Supervision          Transfers Overall transfer level: Needs assistance Equipment used: None Transfers: Sit to/from Stand, Bed to chair/wheelchair/BSC Sit to  Stand: Supervision                 Balance                                           ADL either performed or assessed with clinical judgement   ADL Overall ADL's : Needs assistance/impaired Eating/Feeding: Set up;Bed level;Cueing for sequencing Eating/Feeding Details (indicate cue type and reason): pt. required intermittent cues for task completion. easily distracted by conversation and herself                 Lower Body Dressing: Supervision/safety;Sitting/lateral leans Lower Body Dressing Details (indicate cue type and reason): for slippers Toilet Transfer: Supervision/safety;Ambulation;Comfort height toilet   Toileting- Clothing Manipulation and Hygiene: Supervision/safety;Sitting/lateral lean       Functional mobility during ADLs: Supervision/safety General ADL Comments: education and examples provided for fall prevention at home- pt. able to describe how she safely ambulates up/down stairs and will "always use a rail"    Extremity/Trunk Assessment              Vision       Perception     Praxis      Cognition Arousal/Alertness: Awake/alert Behavior During Therapy: Anxious, Impulsive Overall Cognitive Status: Difficult to assess                                          Exercises      Shoulder Instructions       General Comments      Pertinent Vitals/ Pain  Pain Assessment Pain Assessment: Faces Faces Pain Scale: No hurt  Home Living                                          Prior Functioning/Environment              Frequency  Min 2X/week        Progress Toward Goals  OT Goals(current goals can now be found in the care plan section)  Progress towards OT goals: Progressing toward goals     Plan      Co-evaluation                 AM-PAC OT "6 Clicks" Daily Activity     Outcome Measure   Help from another person eating meals?: None Help from another  person taking care of personal grooming?: A Little Help from another person toileting, which includes using toliet, bedpan, or urinal?: A Little Help from another person bathing (including washing, rinsing, drying)?: A Little Help from another person to put on and taking off regular upper body clothing?: A Little Help from another person to put on and taking off regular lower body clothing?: A Little 6 Click Score: 19    End of Session    OT Visit Diagnosis: Unsteadiness on feet (R26.81);Other symptoms and signs involving the nervous system (R29.898);Other symptoms and signs involving cognitive function;Cognitive communication deficit (R41.841)   Activity Tolerance Patient tolerated treatment well   Patient Left in chair;with call bell/phone within reach   Nurse Communication Other (comment)-notified rn pt. Had BM while in b.room, also that she would benefit from a chair alarm being placed.         Time: 6440-3474 OT Time Calculation (min): 24 min  Charges: OT General Charges $OT Visit: 1 Visit OT Treatments $Self Care/Home Management : 23-37 mins  Sonia Baller, COTA/L Acute Rehabilitation [phone]   Clearnce Sorrel Lorraine-COTA/L 06/21/2022, 9:05 AM

## 2022-06-21 NOTE — TOC Progression Note (Addendum)
Transition of Care [facility]) - Progression Note    Patient Details  Name: Miranda Martinez MRN: [id number] Date of Birth: [birth year]  Transition of Care [facility] CM/SW Contact  Avital Dancy, Edson Snowball, RN Phone Number: 06/21/2022, 9:55 AM  Clinical Narrative:     Called patients son Rob and left message. Await call back   1220 Called son again and left another message    1300 no family at bedside. Called son from patient's cell phone no answer  1320 Rob called NCM back. He will be contact person for home health agency. He would like home health to see her at his address: [address], [place] with [place] accepted home health referral.   Leonor Liv will provide 24/7 supervision. Rob asking about possible assisted living in future . Added HHSW . Spoke with Hoyle Sauer with [facility] they can add a Education officer, museum to assist with this . Left Rob a voicemail    Rob will be at hospital around 3:30 pm to pick his mother up. Secure chatted bedside nurse  Expected Discharge Plan: [place]    Expected Discharge Plan and Services Expected Discharge Plan: [place]   Discharge Planning Services: CM Consult Post Acute Care Choice: [place] arrangements for the past 2 months: Single Family Home Expected Discharge Date: 06/21/22               DME Arranged: N/A DME Agency: NA       HH Arranged: PT, OT, Speech Therapy           Social Determinants of Health (SDOH) Interventions    Readmission Risk Interventions     No data to display

## 2022-06-21 NOTE — Progress Notes (Signed)
PT Cancellation Note  Patient Details Name: Miranda Martinez MRN: [id number] DOB: 09/04/1946   Cancelled Treatment:    Reason Eval/Treat Not Completed: Other (comment).  Was occupied with meal and with other staff, will retry as time and pt allow.     Ramond Dial 06/21/2022, 4:45 PM  Mee Hives, PT PhD Acute Rehab Dept. Number: [place] and [place]

## 2022-06-23 ENCOUNTER — Encounter (HOSPITAL_COMMUNITY): Payer: Self-pay | Admitting: Neurological Surgery

## 2022-06-24 ENCOUNTER — Inpatient Hospital Stay: Payer: Medicare Other | Attending: Neurological Surgery

## 2022-06-24 ENCOUNTER — Telehealth: Payer: Self-pay | Admitting: Radiation Therapy

## 2022-06-24 NOTE — Telephone Encounter (Signed)
Left a detailed message regarding an appointment with Dr. [name] 10/10. Requested a call back.  Mont Dutton R.T.(R)(T) Radiation Special Procedures Navigator

## 2022-06-25 ENCOUNTER — Inpatient Hospital Stay: Payer: Medicare Other | Admitting: Internal Medicine

## 2022-06-27 ENCOUNTER — Telehealth: Payer: Self-pay | Admitting: Radiation Therapy

## 2022-06-27 ENCOUNTER — Inpatient Hospital Stay: Payer: Medicare Other | Admitting: Internal Medicine

## 2022-06-27 NOTE — Telephone Encounter (Signed)
Left another detailed message for pt and her son requesting a call back to set up a consult with Dr. [name].   Mont Dutton R.T.(R)(T) Radiation Special Procedures Navigator

## 2022-06-28 ENCOUNTER — Telehealth: Payer: Self-pay | Admitting: Internal Medicine

## 2022-06-28 NOTE — Telephone Encounter (Signed)
Called pt to sch appt with Dr. [name] per 10/13 staff msg from [place]. No answer. Left vm on pt's cell number, no vm available on home number. Called pt's son, no answer. Left vm on his number as well.

## 2022-07-01 ENCOUNTER — Inpatient Hospital Stay (HOSPITAL_COMMUNITY): Payer: Medicare Other

## 2022-07-01 ENCOUNTER — Emergency Department (HOSPITAL_COMMUNITY): Payer: Medicare Other

## 2022-07-01 ENCOUNTER — Other Ambulatory Visit: Payer: Self-pay

## 2022-07-01 ENCOUNTER — Inpatient Hospital Stay (HOSPITAL_COMMUNITY)
Admission: EM | Admit: 2022-07-01 | Discharge: 2022-07-16 | DRG: 100 | Disposition: A | Discharge status: Hospice | Payer: Medicare Other | Attending: Internal Medicine | Admitting: Internal Medicine

## 2022-07-01 ENCOUNTER — Encounter (HOSPITAL_COMMUNITY): Payer: Self-pay | Admitting: Radiology

## 2022-07-01 ENCOUNTER — Telehealth: Payer: Self-pay | Admitting: Internal Medicine

## 2022-07-01 DIAGNOSIS — R29810 Facial weakness: Secondary | ICD-10-CM | POA: Diagnosis present

## 2022-07-01 DIAGNOSIS — G40901 Epilepsy, unspecified, not intractable, with status epilepticus: Secondary | ICD-10-CM | POA: Diagnosis present

## 2022-07-01 DIAGNOSIS — Z880 Allergy status to penicillin: Secondary | ICD-10-CM

## 2022-07-01 DIAGNOSIS — R471 Dysarthria and anarthria: Secondary | ICD-10-CM | POA: Diagnosis present

## 2022-07-01 DIAGNOSIS — R197 Diarrhea, unspecified: Secondary | ICD-10-CM | POA: Diagnosis not present

## 2022-07-01 DIAGNOSIS — R1310 Dysphagia, unspecified: Secondary | ICD-10-CM | POA: Diagnosis present

## 2022-07-01 DIAGNOSIS — T380X5A Adverse effect of glucocorticoids and synthetic analogues, initial encounter: Secondary | ICD-10-CM | POA: Diagnosis not present

## 2022-07-01 DIAGNOSIS — Z66 Do not resuscitate: Secondary | ICD-10-CM | POA: Diagnosis not present

## 2022-07-01 DIAGNOSIS — G9340 Encephalopathy, unspecified: Secondary | ICD-10-CM | POA: Diagnosis not present

## 2022-07-01 DIAGNOSIS — Z885 Allergy status to narcotic agent status: Secondary | ICD-10-CM

## 2022-07-01 DIAGNOSIS — R339 Retention of urine, unspecified: Secondary | ICD-10-CM | POA: Diagnosis present

## 2022-07-01 DIAGNOSIS — Z711 Person with feared health complaint in whom no diagnosis is made: Secondary | ICD-10-CM | POA: Diagnosis not present

## 2022-07-01 DIAGNOSIS — T420X5A Adverse effect of hydantoin derivatives, initial encounter: Secondary | ICD-10-CM | POA: Diagnosis present

## 2022-07-01 DIAGNOSIS — R739 Hyperglycemia, unspecified: Secondary | ICD-10-CM | POA: Diagnosis not present

## 2022-07-01 DIAGNOSIS — E871 Hypo-osmolality and hyponatremia: Secondary | ICD-10-CM | POA: Diagnosis not present

## 2022-07-01 DIAGNOSIS — C719 Malignant neoplasm of brain, unspecified: Secondary | ICD-10-CM | POA: Diagnosis present

## 2022-07-01 DIAGNOSIS — G9341 Metabolic encephalopathy: Secondary | ICD-10-CM | POA: Diagnosis present

## 2022-07-01 DIAGNOSIS — Y92239 Unspecified place in hospital as the place of occurrence of the external cause: Secondary | ICD-10-CM | POA: Diagnosis not present

## 2022-07-01 DIAGNOSIS — N3000 Acute cystitis without hematuria: Secondary | ICD-10-CM | POA: Diagnosis present

## 2022-07-01 DIAGNOSIS — Z7189 Other specified counseling: Secondary | ICD-10-CM | POA: Diagnosis not present

## 2022-07-01 DIAGNOSIS — R7401 Elevation of levels of liver transaminase levels: Secondary | ICD-10-CM | POA: Diagnosis not present

## 2022-07-01 DIAGNOSIS — Z789 Other specified health status: Secondary | ICD-10-CM | POA: Diagnosis not present

## 2022-07-01 DIAGNOSIS — C712 Malignant neoplasm of temporal lobe: Secondary | ICD-10-CM | POA: Diagnosis present

## 2022-07-01 DIAGNOSIS — T420X1D Poisoning by hydantoin derivatives, accidental (unintentional), subsequent encounter: Secondary | ICD-10-CM | POA: Diagnosis not present

## 2022-07-01 DIAGNOSIS — D696 Thrombocytopenia, unspecified: Secondary | ICD-10-CM | POA: Diagnosis present

## 2022-07-01 DIAGNOSIS — R569 Unspecified convulsions: Secondary | ICD-10-CM

## 2022-07-01 DIAGNOSIS — R2990 Unspecified symptoms and signs involving the nervous system: Secondary | ICD-10-CM | POA: Diagnosis not present

## 2022-07-01 DIAGNOSIS — B9620 Unspecified Escherichia coli [E. coli] as the cause of diseases classified elsewhere: Secondary | ICD-10-CM | POA: Diagnosis present

## 2022-07-01 DIAGNOSIS — G936 Cerebral edema: Secondary | ICD-10-CM | POA: Diagnosis present

## 2022-07-01 DIAGNOSIS — R202 Paresthesia of skin: Secondary | ICD-10-CM | POA: Diagnosis present

## 2022-07-01 DIAGNOSIS — E43 Unspecified severe protein-calorie malnutrition: Secondary | ICD-10-CM | POA: Insufficient documentation

## 2022-07-01 DIAGNOSIS — Z79899 Other long term (current) drug therapy: Secondary | ICD-10-CM | POA: Diagnosis not present

## 2022-07-01 DIAGNOSIS — R4701 Aphasia: Secondary | ICD-10-CM | POA: Diagnosis present

## 2022-07-01 DIAGNOSIS — Z6821 Body mass index (BMI) 21.0-21.9, adult: Secondary | ICD-10-CM

## 2022-07-01 DIAGNOSIS — R638 Other symptoms and signs concerning food and fluid intake: Secondary | ICD-10-CM | POA: Diagnosis not present

## 2022-07-01 DIAGNOSIS — Z515 Encounter for palliative care: Secondary | ICD-10-CM | POA: Diagnosis not present

## 2022-07-01 HISTORY — DX: Malignant neoplasm of brain, unspecified: C71.9

## 2022-07-01 LAB — COMPREHENSIVE METABOLIC PANEL
ALT: 24 U/L (ref 0–44)
AST: 20 U/L (ref 15–41)
Albumin: 3.3 g/dL — ABNORMAL LOW (ref 3.5–5.0)
Alkaline Phosphatase: 26 U/L — ABNORMAL LOW (ref 38–126)
Anion gap: 6 (ref 5–15)
BUN: 28 mg/dL — ABNORMAL HIGH (ref 8–23)
CO2: 24 mmol/L (ref 22–32)
Calcium: 8.3 mg/dL — ABNORMAL LOW (ref 8.9–10.3)
Chloride: 104 mmol/L (ref 98–111)
Creatinine, Ser: 0.75 mg/dL (ref 0.44–1.00)
GFR, Estimated: >60 mL/min (ref >60)
Glucose, Bld: 176 mg/dL — ABNORMAL HIGH (ref 70–99)
Potassium: 4.1 mmol/L (ref 3.5–5.1)
Sodium: 134 mmol/L — ABNORMAL LOW (ref 135–145)
Total Bilirubin: 1.2 mg/dL (ref 0.3–1.2)
Total Protein: 5.9 g/dL — ABNORMAL LOW (ref 6.5–8.1)

## 2022-07-01 LAB — HEMOGLOBIN A1C
Hgb A1c MFr Bld: 5.5 % (ref 4.8–5.6)
Mean Plasma Glucose: 111.15 mg/dL

## 2022-07-01 LAB — APTT: aPTT: 21 seconds — ABNORMAL LOW (ref 24–36)

## 2022-07-01 LAB — DIFFERENTIAL
Abs Immature Granulocytes: 0.09 10*3/uL — ABNORMAL HIGH (ref 0.00–0.07)
Basophils Absolute: 0 10*3/uL (ref 0.0–0.1)
Basophils Relative: 0 %
Eosinophils Absolute: 0 10*3/uL (ref 0.0–0.5)
Eosinophils Relative: 0 %
Immature Granulocytes: 1 %
Lymphocytes Relative: 27 %
Lymphs Abs: 4 10*3/uL (ref 0.7–4.0)
Monocytes Absolute: 1.4 10*3/uL — ABNORMAL HIGH (ref 0.1–1.0)
Monocytes Relative: 10 %
Neutro Abs: 9.1 10*3/uL — ABNORMAL HIGH (ref 1.7–7.7)
Neutrophils Relative %: 62 %

## 2022-07-01 LAB — PROTIME-INR
INR: 1.1 (ref 0.8–1.2)
Prothrombin Time: 14.5 seconds (ref 11.4–15.2)

## 2022-07-01 LAB — CBG MONITORING, ED
Glucose-Capillary: 158 mg/dL — ABNORMAL HIGH (ref 70–99)
Glucose-Capillary: 183 mg/dL — ABNORMAL HIGH (ref 70–99)

## 2022-07-01 LAB — CBC
HCT: 40.3 % (ref 36.0–46.0)
Hemoglobin: 13.8 g/dL (ref 12.0–15.0)
MCH: 33.8 pg (ref 26.0–34.0)
MCHC: 34.2 g/dL (ref 30.0–36.0)
MCV: 98.8 fL (ref 80.0–100.0)
Platelets: 300 10*3/uL (ref 150–400)
RBC: 4.08 MIL/uL (ref 3.87–5.11)
RDW: 13.2 % (ref 11.5–15.5)
WBC: 14.6 10*3/uL — ABNORMAL HIGH (ref 4.0–10.5)
nRBC: 0 % (ref 0.0–0.2)

## 2022-07-01 LAB — I-STAT CHEM 8, ED
BUN: 30 mg/dL — ABNORMAL HIGH (ref 8–23)
Calcium, Ion: 1.17 mmol/L (ref 1.15–1.40)
Chloride: 102 mmol/L (ref 98–111)
Creatinine, Ser: 0.7 mg/dL (ref 0.44–1.00)
Glucose, Bld: 178 mg/dL — ABNORMAL HIGH (ref 70–99)
HCT: 42 % (ref 36.0–46.0)
Hemoglobin: 14.3 g/dL (ref 12.0–15.0)
Potassium: 4 mmol/L (ref 3.5–5.1)
Sodium: 135 mmol/L (ref 135–145)
TCO2: 26 mmol/L (ref 22–32)

## 2022-07-01 LAB — ETHANOL: Alcohol, Ethyl (B): <10 mg/dL (ref <10)

## 2022-07-01 MED ORDER — ACETAMINOPHEN 325 MG PO TABS: 650.0000 mg | ORAL_TABLET | ORAL | Status: DC | PRN

## 2022-07-01 MED ORDER — LEVETIRACETAM IN NACL 1500 MG/100ML IV SOLN
1500.0000 mg | Freq: Once | INTRAVENOUS | Status: AC
  Administered 2022-07-01: 1500 mg via INTRAVENOUS
  Filled 2022-07-01 (×2): qty 100

## 2022-07-01 MED ORDER — ACETAMINOPHEN 650 MG RE SUPP: 650.0000 mg | RECTAL | Status: DC | PRN

## 2022-07-01 MED ORDER — DEXAMETHASONE SODIUM PHOSPHATE 10 MG/ML IJ SOLN
6.0000 mg | Freq: Once | INTRAMUSCULAR | Status: AC
  Administered 2022-07-01: 6 mg via INTRAVENOUS
  Filled 2022-07-01: qty 1

## 2022-07-01 MED ORDER — SENNOSIDES-DOCUSATE SODIUM 8.6-50 MG PO TABS: 1.0000 | ORAL_TABLET | Freq: Every evening | ORAL | Status: DC | PRN

## 2022-07-01 MED ORDER — DEXAMETHASONE SODIUM PHOSPHATE 10 MG/ML IJ SOLN
10.0000 mg | Freq: Four times a day (QID) | INTRAMUSCULAR | Status: DC
  Administered 2022-07-01 – 2022-07-05 (×14): 10 mg via INTRAVENOUS
  Filled 2022-07-01 (×14): qty 1

## 2022-07-01 MED ORDER — INSULIN ASPART 100 UNIT/ML IJ SOLN
0.0000 [IU] | Freq: Three times a day (TID) | INTRAMUSCULAR | Status: DC
  Administered 2022-07-01: 1 [IU] via SUBCUTANEOUS
  Administered 2022-07-02 – 2022-07-03 (×5): 2 [IU] via SUBCUTANEOUS
  Administered 2022-07-04 (×2): 1 [IU] via SUBCUTANEOUS
  Administered 2022-07-04: 2 [IU] via SUBCUTANEOUS
  Administered 2022-07-05 (×2): 1 [IU] via SUBCUTANEOUS
  Administered 2022-07-05: 2 [IU] via SUBCUTANEOUS
  Administered 2022-07-06 – 2022-07-08 (×4): 1 [IU] via SUBCUTANEOUS
  Administered 2022-07-09: 2 [IU] via SUBCUTANEOUS
  Administered 2022-07-09: 1 [IU] via SUBCUTANEOUS

## 2022-07-01 MED ORDER — ACETAMINOPHEN 160 MG/5ML PO SOLN: 650.0000 mg | ORAL | Status: DC | PRN

## 2022-07-01 MED ORDER — IOHEXOL 350 MG/ML SOLN
50.0000 mL | Freq: Once | INTRAVENOUS | Status: AC | PRN
  Administered 2022-07-01: 50 mL via INTRAVENOUS

## 2022-07-01 MED ORDER — LORAZEPAM 2 MG/ML IJ SOLN
INTRAMUSCULAR | Status: AC
  Administered 2022-07-01: 2 mg
  Filled 2022-07-01: qty 1

## 2022-07-01 MED ORDER — DEXAMETHASONE SODIUM PHOSPHATE 4 MG/ML IJ SOLN
4.0000 mg | Freq: Two times a day (BID) | INTRAMUSCULAR | Status: DC
  Administered 2022-07-01: 4 mg via INTRAVENOUS
  Filled 2022-07-01: qty 1

## 2022-07-01 MED ORDER — LEVETIRACETAM IN NACL 500 MG/100ML IV SOLN
500.0000 mg | Freq: Two times a day (BID) | INTRAVENOUS | Status: DC
  Administered 2022-07-02: 500 mg via INTRAVENOUS
  Filled 2022-07-01 (×2): qty 100

## 2022-07-01 MED ORDER — DEXAMETHASONE SODIUM PHOSPHATE 10 MG/ML IJ SOLN: 10.0000 mg | Freq: Four times a day (QID) | INTRAMUSCULAR | Status: DC

## 2022-07-01 NOTE — ED Notes (Signed)
RN unable to complete swallow screen at this time due to pt being lethargic from ativan.

## 2022-07-01 NOTE — Telephone Encounter (Signed)
R/s pt's appt. Pt's son is aware.

## 2022-07-01 NOTE — Consult Note (Signed)
Neurology Consultation Note  Consult Requested by: Dr. Maylon Peppers  Reason for Consult: code stroke  Consult Date: 07/01/22   The history was obtained from the husband and ED staff.  During history and examination, all items were able to obtain unless otherwise noted.  History of Present Illness:  Miranda Martinez is a 76 y.o. 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  Per husband, patient was in the salon and started to have altered mental status, dazed off, not answer questions. Left eyelid and facial twitching with EMS and right facial droop in ED. no shaking jerking, tongue biting or bowel bladder incontinence.  No obvious weakness observed.  CT no acute change but left brain brain tumor with edema and midline shift.  CT head and neck no LVO.  Received Ativan 2 mg and started on Keppra IV as well as Decadron IV.  Neurosurgery on board.  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 She has never used smokeless tobacco. She reports that she does not drink alcohol and does not use drugs.  Allergies:  Allergies  Allergen Reactions   Codeine Itching   Penicillins Itching    No current facility-administered medications on file  prior to encounter.   Current Outpatient Medications on File Prior to Encounter  Medication Sig Dispense Refill   B Complex Vitamins (VITAMIN B COMPLEX) TABS Take 1 tablet by mouth daily.     Cholecalciferol (VITAMIN D-1000 MAX ST) 25 MCG (1000 UT) tablet Take 1,000 Units by mouth daily.     dexamethasone (DECADRON) 4 MG tablet Take 1 tablet (4 mg total) by mouth 2 (two) times daily. 60 tablet 2    Review of Systems: A full ROS was attempted today and was able to be performed.  Systems assessed include - Constitutional, Eyes, HENT, Respiratory, Cardiovascular, Gastrointestinal, Genitourinary, Integument/breast, Hematologic/lymphatic, Musculoskeletal, Neurological, Behavioral/Psych, Endocrine, Allergic/Immunologic - with pertinent responses as per HPI.  Physical Examination: Temp:  [98.2 F (36.8 C)] 98.2 F (36.8 C) (10/16 1408) Pulse Rate:  [64-94] 66 (10/16 1615) Resp:  [11-23] 22 (10/16 1615) BP: (136-178)/(71-83) 153/76 (10/16 1615) SpO2:  [98 %-100 %] 100 % (10/16 1615)  General - well nourished, well developed, in no apparent distress.    Ophthalmologic - fundi not visualized due to noncooperation.    Cardiovascular - regular rhythm and rate  Neuro - awake, alert, eyes open, able to say "yes" but not answer orientation questions not able to follow simple commands.  Not able to name and repeat. No gaze palsy, tracking bilaterally, blinking to visual threat bilaterally.  Right mild facial droop. Tongue motion not corporative. Bilateral UEs 5/5, no drift. Bilaterally LEs 5/5, no drift. Sensation, coordination not corporative and gait not  tested.  NIH Stroke Scale  Level Of Consciousness 0=Alert; keenly responsive 1=Arouse to minor stimulation 2=Requires repeated stimulation to arouse or movements to pain 3=postures or unresponsive 0  LOC Questions to Month and Age 25=Answers both questions correctly 1=Answers one question correctly or dysarthria/intubated/trauma/language  barrier 2=Answers neither question correctly or aphasia 2  LOC Commands      -Open/Close eyes     -Open/close grip     -Pantomime commands if communication barrier 0=Performs both tasks correctly 1=Performs one task correctly 2=Performs neighter task correctly 2  Best Gaze     -Only assess horizontal gaze 0=Normal 1=Partial gaze palsy 2=Forced deviation, or total gaze paresis 0  Visual 0=No visual loss 1=Partial hemianopia 2=Complete hemianopia 3=Bilateral hemianopia (blind including cortical blindness) 0  Facial Palsy     -Use grimace if obtunded 0=Normal symmetrical movement 1=Minor paralysis (asymmetry) 2=Partial paralysis (lower face) 3=Complete paralysis (upper and lower face) 1  Motor  0=No drift for 10/5 seconds 1=Drift, but does not hit bed 2=Some antigravity effort, hits  bed 3=No effort against gravity, limb falls 4=No movement 0=Amputation/joint fusion Right Arm 0     Leg 0    Left Arm 0     Leg 0  Limb Ataxia     - FNT/HTS 0=Absent or does not understand or paralyzed or amputation/joint fusion 1=Present in one limb 2=Present in two limbs 0  Sensory 0=Normal 1=Mild to moderate sensory loss 2=Severe to total sensory loss or coma/unresponsive 0  Best Language 0=No aphasia, normal 1=Mild to moderate aphasia 2=Severe aphasia 3=Mute, global aphasia, or coma/unresponsive 2  Dysarthria 0=Normal 1=Mild to moderate 2=Severe, unintelligible or mute/anarthric 0=intubated/unable to test 2  Extinction/Neglect 0=No abnormality 1=visual/tactile/auditory/spatia/personal inattention/Extinction to bilateral simultaneous stimulation 2=Profound neglect/extinction more than 1 modality  1  Total   10      Data Reviewed: CT VENOGRAM HEAD  Result Date: 07/01/2022 CLINICAL DATA:  Follow-up left-sided biopsy for tumor. EXAM: CT VENOGRAM HEAD TECHNIQUE: Venographic phase images of the brain were obtained following the administration of intravenous contrast. Multiplanar  reformats and maximum intensity projections were generated. RADIATION DOSE REDUCTION: This exam was performed according to the departmental dose-optimization program which includes automated exposure control, adjustment of the mA and/or kV according to patient size and/or use of iterative reconstruction technique. CONTRAST:  48m OMNIPAQUE IOHEXOL 350 MG/ML SOLN COMPARISON:  Arterial study same day. Multiple other recent imaging studies beginning 06/16/2022. FINDINGS: Superior sagittal sinus is widely patent. Deep venous system is widely patent. Transverse sinuses, sigmoid sinuses and jugular veins show flow. Normal appearing cavernous sinuses. As noted on the arterial study, there is a prominent vein on the left draining superficial from near the region of the biopsy. I would favor that this is a prominent superficial draining vein possibly due to hyperemia in the region. The unusual possibility of a post biopsy arteriovenous fistula is considered, but that would seem unlikely. IMPRESSION: 1. No evidence of dural venous sinus thrombosis. 2. Prominent superficial draining vein on the left near the region of the biopsy. I would favor that this is a prominent draining vein possibly due to hyperemia in the region. The unusual possibility of a post biopsy arteriovenous fistula is considered, but that would seem unlikely. Electronically Signed   By: MNelson ChimesM.D.   On: 07/01/2022 14:20   CT ANGIO HEAD NECK W WO CM (CODE STROKE)  Result Date: 07/01/2022 CLINICAL DATA:  Neuro deficit, acute, stroke suspected EXAM: CT ANGIOGRAPHY HEAD AND NECK TECHNIQUE: Multidetector CT imaging  of the head and neck was performed using the standard protocol during bolus administration of intravenous contrast. Multiplanar CT image reconstructions and MIPs were obtained to evaluate the vascular anatomy. Carotid stenosis measurements (when applicable) are obtained utilizing NASCET criteria, using the distal internal carotid diameter  as the denominator. RADIATION DOSE REDUCTION: This exam was performed according to the departmental dose-optimization program which includes automated exposure control, adjustment of the mA and/or kV according to patient size and/or use of iterative reconstruction technique. CONTRAST:  78m OMNIPAQUE IOHEXOL 350 MG/ML SOLN COMPARISON:  Multiple recent imaging studies including head CT earlier same day. FINDINGS: CTA NECK FINDINGS Aortic arch: Aortic atherosclerosis, mild. Branching pattern is normal. Right carotid system: Common carotid artery widely patent to the bifurcation. Minimal plaque at the ICA bulb but no stenosis. Cervical ICA widely patent. Left carotid system: Common carotid artery widely patent to the bifurcation. Normal appearance of the bifurcation and ICA bulb. Cervical ICA is normal. Vertebral arteries: Both vertebral artery origins are widely patent. Both vertebral arteries are normal through the cervical region to the foramen magnum. Skeleton: Normal Other neck: No mass or lymphadenopathy. Upper chest: Lung apices are clear. Review of the MIP images confirms the above findings CTA HEAD FINDINGS Anterior circulation: Both internal carotid arteries are patent through the skull base and siphon regions. No siphon stenosis. The anterior and middle cerebral vessels are patent. No large vessel occlusion or proximal stenosis. No aneurysm. There is a prominent vein extending superficial from the region of the operative approach. This could represent a normal vein that is well seen due to regional hyperemia, but the possibility of a small postoperative arteriovenous fistula is entertained. That would seem unlikely. Posterior circulation: Both vertebral arteries widely patent to the basilar. No basilar stenosis. Posterior circulation branch vessels are normal. Venous sinuses: Patent and normal. Anatomic variants: None significant. Review of the MIP images confirms the above findings IMPRESSION: 1. No large  vessel occlusion or proximal stenosis. No aneurysm. 2. Prominent vein extending superficial from the region of the operative approach. This could represent a normal vein that is prominent due to regional hyperemia, but the possibility of a small postoperative arteriovenous fistula is entertained. That would seem unlikely. 3. Aortic atherosclerosis. Aortic Atherosclerosis (ICD10-I70.0). Electronically Signed   By: MNelson ChimesM.D.   On: 07/01/2022 14:17   CT HEAD CODE STROKE WO CONTRAST  Result Date: 07/01/2022 CLINICAL DATA:  Code stroke. EXAM: CT HEAD WITHOUT CONTRAST TECHNIQUE: Contiguous axial images were obtained from the base of the skull through the vertex without intravenous contrast. RADIATION DOSE REDUCTION: This exam was performed according to the departmental dose-optimization program which includes automated exposure control, adjustment of the mA and/or kV according to patient size and/or use of iterative reconstruction technique. COMPARISON:  CT head June 18, 2022. FINDINGS: Brain: No clear evidence of acute large vascular territory infarct. Small volume of hyperdense hemorrhage coursing linearly subjacent to the left-sided burr hole, probably a combination of intraparenchymal and extra-axial. Known infiltrating tumor better characterized on prior MRI. Similar mass effect with approximately 7 mm of rightward midline shift. No evidence of hydrocephalus. Vascular: No hyperdense vessel identified. Skull: No acute fracture.  Left burr hole. Sinuses/Orbits: No acute findings. Other: No mastoid effusions. IMPRESSION: 1. Small volume of hyperdense hemorrhage coursing linearly subjacent to the left-sided burr hole (probably a combination of intraparenchymal and extra-axial) and likely related to recent biopsy. Recommend short interval follow-up CT head to ensure stability. 2. No clear evidence of acute large vascular  territory infarct 3. Known infiltrating tumor better characterized on prior MRI.  Similar mass effect with approximately 7 mm of rightward midline shift. Findings discussed with Dr. Leonel Ramsay via telephone at 1:52 PM Electronically Signed   By: Margaretha Sheffield M.D.   On: 07/01/2022 13:56   CT BRAINLAB HEAD W/O CONTRAST (1MM)  Result Date: 06/18/2022 CLINICAL DATA:  Provided history: Increased aphasia after brain biopsy. EXAM: CT HEAD WITHOUT CONTRAST TECHNIQUE: Contiguous axial images were obtained from the base of the skull through the vertex without intravenous contrast. RADIATION DOSE REDUCTION: This exam was performed according to the departmental dose-optimization program which includes automated exposure control, adjustment of the mA and/or kV according to patient size and/or use of iterative reconstruction technique. COMPARISON:  Prior head CT examinations 06/17/2022 and earlier. Brain MRI 06/16/2022. FINDINGS: Brain: Redemonstration of two hyperdense foci within the left cerebral hemisphere, one within the left periatrial white matter and the other within the lateral aspect of the posterior left temporal lobe. As before, there is fairly extensive surrounding abnormal hypodensity within the left cerebral hemisphere, basal ganglia and thalamus with associated mass effect. Unchanged partial effacement of the left lateral ventricle with 7 mm rightward midline shift. The constellation of findings is favored to reflect a primary CNS neoplasm (such as a glioma) or lymphoma. New from the prior examination, a small burr hole is present within the left temporoparietal calvarium, in close proximity to the hyperdense lesion in the lateral aspect of the posterior left temporal lobe (series 6, images 25-27). No evidence of postoperative hemorrhage at the biopsy site. Small-volume pneumocephalus is now present along the anteromedial aspect of the left frontal lobe. No demarcated cortical infarct. Vascular: No hyperdense vessel. Atherosclerotic calcifications. Skull: Small burr hole within the  left temporoparietal calvarium. Sinuses/Orbits: No mass or acute finding within the imaged orbits. No significant paranasal sinus disease. IMPRESSION: Immediate postoperative changes from interval left cerebral biopsy, as described. No significant hemorrhage at the biopsy site. Small-volume pneumocephalus present along the anteromedial aspect of the left frontal lobe. Electronically Signed   By: Kellie Simmering D.O.   On: 06/18/2022 16:45   CT BRAINLAB HEAD W/O CONTRAST (1MM)  Result Date: 06/17/2022 CLINICAL DATA:  Provided history: Neurologic problem. EXAM: CT HEAD WITHOUT CONTRAST TECHNIQUE: Contiguous axial images were obtained from the base of the skull through the vertex without intravenous contrast. RADIATION DOSE REDUCTION: This exam was performed according to the departmental dose-optimization program which includes automated exposure control, adjustment of the mA and/or kV according to patient size and/or use of iterative reconstruction technique. COMPARISON:  Brain MRI 06/16/2022. Head CT 06/16/2022. FINDINGS: Brain: Non-contrast head CT performed for the purposes of intraoperative navigation. Redemonstration of two hyperdense areas, one within the left periatrial white matter and the other at the posterolateral aspect of the left temporal lobe. These foci correspond with areas of restricted diffusion demonstrated on yesterday's brain MRI. As before, there is fairly extensive surrounding abnormal hypodensity within the left cerebral hemisphere, basal ganglia and thalamus with associated mass effect. Unchanged partial effacement of the left lateral ventricle with 7 mm rightward midline shift. There is no acute intracranial hemorrhage. No acute demarcated cortical infarct. No extra-axial fluid collection. Vascular: No hyperdense vessel. Skull: No fracture or aggressive osseous lesion. Sinuses/Orbits: No mass or acute finding within the imaged orbits. No significant paranasal sinus disease. IMPRESSION:  Non-contrast head CT performed for the purposes of intraoperative navigation. Left cerebral abnormality, as described and unchanged from yesterday's CT exam. Favored differential considerations include  a primary CNS neoplasm (such as a glioma) or lymphoma. Unchanged mass effect with partial effacement of the left lateral ventricle and 7 mm rightward midline shift. Electronically Signed   By: Kellie Simmering D.O.   On: 06/17/2022 16:46   CT Head Wo Contrast  Addendum Date: 06/16/2022   ADDENDUM REPORT: 06/16/2022 19:50 ADDENDUM: There is a dictation error within the second line of the first impression, which should read: Additionally, there is loss of gray-white differentiation within portions of the anterolateral left FRONTAL lobe and left insula." Electronically Signed   By: Kellie Simmering D.O.   On: 06/16/2022 19:50   Result Date: 06/16/2022 CLINICAL DATA:  Provided history: Mental status change, unknown cause. Intermittent confusion and word-finding difficulty. EXAM: CT HEAD WITHOUT CONTRAST TECHNIQUE: Contiguous axial images were obtained from the base of the skull through the vertex without intravenous contrast. RADIATION DOSE REDUCTION: This exam was performed according to the departmental dose-optimization program which includes automated exposure control, adjustment of the mA and/or kV according to patient size and/or use of iterative reconstruction technique. COMPARISON:  No pertinent prior exams available for comparison. FINDINGS: Brain: 12 mm hyperdense focus in the left temporoparietal white matter with surrounding edema (for instance as seen on series 2, image 15). Ill-defined focus of hyperdensity more laterally along the posterior left temporal lobe, measuring 15 mm (for instance as seen on series 2, image 14) (series 4, image 23). Apparent loss of gray-white differentiation within portions of the anterolateral left frontal lobe and left insula. Mass effect arising from the left cerebral hemisphere  with partial effacement of the left lateral ventricle and 5 mm rightward midline shift. Background mild patchy and ill-defined hypoattenuation within the cerebral white matter, nonspecific but compatible chronic small vessel disease. Vascular: No hyperdense vessel.  Atherosclerotic calcifications. Skull: No fracture or aggressive osseous lesion. Sinuses/Orbits: No mass or acute finding within the imaged orbits. No significant paranasal sinus disease at the imaged levels. These results were called by telephone at the time of interpretation on 06/16/2022 at 2:12 pm to provider RILEY RANSOM , who verbally acknowledged these results. IMPRESSION: 12 mm focus of hyperdensity within the left temporoparietal white matter with surrounding edema in the left cerebral hemisphere. Additionally, there is loss of gray-white differentiation within portions of the anterolateral left temporal lobe and left insula. Although nonspecific, this constellation of findings could potentially reflect a hemorrhagic mass or a recent infarct with hemorrhagic conversion. Associated mass effect with partial effacement of the left lateral ventricle and 5 mm rightward midline shift. A brain MRI without and with contrast is recommended for further characterization. Additional 15 mm focus of hyperdensity along the posterior left temporal lobe, which may reflect acute parenchymal or subarachnoid hemorrhage or other hyperdense lesion. Background mild chronic small vessel image changes within the cerebral white matter. Electronically Signed: By: Kellie Simmering D.O. On: 06/16/2022 14:14   MR Brain W and Wo Contrast  Result Date: 06/16/2022 CLINICAL DATA:  Mental status change, mass suspected on head CT EXAM: MRI HEAD WITHOUT AND WITH CONTRAST TECHNIQUE: Multiplanar, multiecho pulse sequences of the brain and surrounding structures were obtained without and with intravenous contrast. CONTRAST:  5.5 mL Vueway COMPARISON:  No prior head MRI, correlation is  made with 06/16/2022 CT head FINDINGS: Brain: 2 diffusion restricting areas with ADC correlates in the left medial temporal lobe (series 5, image 79) and left posterolateral temporal lobe (series 5, image 77), which correlate with the hyperdense area on the same-day CT. These areas are  associated with increased T2 hyperintense signal and decreased T1 signal, but no definite enhancement. The more medial region measures 9 x 14 x 6 mm (series 16, image 30 and series 19, image 13). The more lateral region is less well-defined but measures approximately 21 x 23 x 13 mm (series 16, image 28 and series 19, image 9). Associated surrounding T2 hyperintense signal extends anteriorly into the left inferior frontal lobe, throughout the left temporal lobe, left basal ganglia and thalamus and into the left parietal and occipital lobes. There is associated mass effect with 7 mm left-to-right midline shift. Effacement of the left lateral and third ventricle, without evidence of hydrocephalus or entrapment of the left temporal horn. No acute infarct, hemorrhage, or extra-axial collection. Vascular: Normal arterial flow voids. Normal arterial and venous enhancement. Skull and upper cervical spine: Normal marrow signal. Sinuses/Orbits: No acute finding. Other: The mastoids are well aerated. IMPRESSION: Two diffusion restricting areas in the left medial and posterolateral temporal lobe, with extensive surrounding T2 hyperintense signal, but no definite enhancement. 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 The suspected hypercellular tumor correlates with the hyperdense areas on the same-day CT This is associated with mass effect and 7 mm of left-to-right midline shift, with effacement of the left lateral and third ventricle but no evidence of hydrocephalus. Electronically Signed   By:  Merilyn Baba M.D.   On: 06/16/2022 19:48   CT CHEST ABDOMEN PELVIS W CONTRAST  Result Date: 06/16/2022 CLINICAL DATA:  Brain metastatic disease. 20-30 pound weight loss since July. EXAM: CT CHEST, ABDOMEN, AND PELVIS WITH CONTRAST TECHNIQUE: Multidetector CT imaging of the chest, abdomen and pelvis was performed following the standard protocol during bolus administration of intravenous contrast. RADIATION DOSE REDUCTION: This exam was performed according to the departmental dose-optimization program which includes automated exposure control, adjustment of the mA and/or kV according to patient size and/or use of iterative reconstruction technique. CONTRAST:  70m OMNIPAQUE IOHEXOL 300 MG/ML  SOLN COMPARISON:  None Available. FINDINGS: CT CHEST FINDINGS Cardiovascular: Heart normal in size and configuration. No pericardial effusion. Great vessels are normal in caliber. No thoracic aortic dissection or significant atherosclerosis. Arch branch vessels are widely patent. Mediastinum/Nodes: Normal thyroid. No neck base, mediastinal or hilar masses or enlarged lymph nodes. Trachea and esophagus are unremarkable. Lungs/Pleura: Mild peripheral interstitial thickening. Mild apical scarring. 3 mm subpleural nodule, left lower lobe, image 112, series 4. No other nodules. No masses. No evidence of pneumonia or pulmonary edema. No pleural effusion or pneumothorax. Musculoskeletal: No fracture or acute finding. No osteoblastic or osteolytic lesions. No chest wall mass. CT ABDOMEN PELVIS FINDINGS Hepatobiliary: No focal liver abnormality is seen. No gallstones, gallbladder wall thickening, or biliary dilatation. Pancreas: Unremarkable. No pancreatic ductal dilatation or surrounding inflammatory changes. Spleen: Normal in size without focal abnormality. Adrenals/Urinary Tract: Normal adrenal glands. Kidneys normal in size, orientation and position with symmetric enhancement and excretion. 3-4 mm low-attenuation lesion, midpole  of the right kidney, too small to characterize but consistent with a cyst. No follow-up recommended. No other renal masses or lesions, no stones and no hydronephrosis. Normal ureters. Normal bladder. Stomach/Bowel: Normal stomach. Small bowel and colon are normal in caliber. No wall thickening. No evidence of a mass. No inflammation. Normal appendix visualized. Vascular/Lymphatic: Minor aortic atherosclerosis. No aneurysm. No enlarged lymph nodes. Reproductive: Uterus and bilateral adnexa are unremarkable. Other: No abdominal wall hernia or  abnormality. No abdominopelvic ascites. Musculoskeletal: Mild compression fractures of L1 and L2, unclear chronicity, most likely chronic. No bone lesion. IMPRESSION: 1. No acute findings within the chest, abdomen or pelvis. 2. No evidence of a primary malignancy. No convincing metastatic disease within the chest, abdomen and pelvis. 3 mm left lower lobe pulmonary nodule, most likely benign. Recommend attention on follow-up imaging. 3. Mild compression fractures of L1 and L2, of unclear chronicity, but likely chronic. Electronically Signed   By: Lajean Manes M.D.   On: 06/16/2022 15:34    Assessment: 76 y.o. 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  Last seen well 10 AM.  NIHSS = 10. CT no acute change but left brain brain tumor with edema and midline shift.  CT head and neck no LVO.  Received Ativan 2 mg and started on Keppra IV as well as Decadron IV.  MRI pending, put on LTM EEG.   Plan: Recommend admission with hospitalist service and continue further work up  Frequent neuro checks Telemetry monitoring MRI brain with and without contrast Long-term EEG for seizure evaluation PT/OT/speech consult Status post Ativan 2 mg stat  Keppra load followed by maintenance dose Neurosurgery on board, agree with Decadron 10 mg every 6 hours IV Continue to follow with  Dr. [name] neuro-oncology Discussed with Dr. Maylon Peppers ED physician We will follow   Thank you for this consultation and allowing [place] to participate in the care of this patient.  Rosalin Hawking, MD PhD Stroke Neurology 07/01/2022 5:13 PM

## 2022-07-01 NOTE — Consult Note (Signed)
Neurosurgery Consultation  Reason for Consult: Brain tumor Referring Physician: Lorin Mercy  CC: Worsening mental status  HPI: This is a 76 y.o. woman, known to me, that presents with fairly acute worsening of neurologic funciton. She had some increased confusion earlier this morning, then some twitching around the left eye in orbicularis, then progressive lethargy with right sided weakness. Seen in the ED, R sided weakness already improving, no other obvious seizure activity, no known auras. She got a load of keppra and some lorazepam, so still quite somnolent.  ROS: A 14 point ROS was performed and is negative except as noted in the HPI.   PMHx:  Past Medical History:  Diagnosis Date   Glioma of brain ([facility])    FamHx:  Family History  Family history unknown: Yes   SocHx:  reports that she has never smoked. She has never used smokeless tobacco. She reports that she does not drink alcohol and does not use drugs.  Exam: Vital signs in last 24 hours: Temp:  [98.2 F (36.8 C)] 98.2 F (36.8 C) (10/16 1408) Pulse Rate:  [64-94] 66 (10/16 1615) Resp:  [11-23] 22 (10/16 1615) BP: (136-178)/(71-83) 153/76 (10/16 1615) SpO2:  [98 %-100 %] 100 % (10/16 1615) General: Obtunded, some occasional spontaneous movements, protecting airway well Head: Normocephalic and atruamatic, post-op wound healing well HEENT: Neck supple Pulmonary: breathing room air comfortably, no evidence of increased work of breathing Cardiac: RRR Abdomen: S NT ND Extremities: Warm and well perfused x4 Neuro: Some minimal movement to painful stim, PERRL with left gaze deviation, non-verbal, face grossly symmetric, right side with minimal movement compared to left spontaneous movement, some mild external rotation of the right leg  Assessment and Plan: 76 y.o. woman with left hemispheric brain tumor, prelim path HGG. CTA/CTV personally reviewed, unremarkable, hx most compatible with seizure.   -EEG overnight -continue  keppra -will increase dex to '10mg'$  -please call with any concerns or questions  Judith Part, MD 07/01/22 4:37 PM [facility]

## 2022-07-01 NOTE — Consult Note (Signed)
Consultation Note Date: 07/01/2022   Patient Name: Miranda Martinez  DOB: 25-Feb-1946  MRN: [id number]  Age / Sex: 76 y.o., female  PCP: [name], DO Referring Physician: Karmen Bongo, MD  Reason for Consultation: Establishing goals of care  HPI/Patient Profile: 76 y.o. female  with past medical history of recent diagnosis of high-grade glioma admitted on 07/01/2022 with stroke-like symptoms.   PMT has been consulted to assist with goals of care conversation.  Clinical Assessment and Goals of Care:  I have reviewed medical records including EPIC notes, labs and imaging, assessed the patient and then met at the bedside with patient's son  to discuss diagnosis prognosis, GOC, EOL wishes, disposition and options.  I introduced Palliative Medicine as specialized medical care for people living with serious illness. It focuses on providing relief from the symptoms and stress of a serious illness. The goal is to improve quality of life for both the patient and the family.  We discussed a brief life review of the patient and then focused on their current illness.   I attempted to elicit values and goals of care important to the patient.    Medical History Review and Understanding:  Patient was overall quite healthy before her recent diagnosis of her high-grade glioma.  We discussed the poor longer term prognosis associated with this.  Social History: Patient has only 1 son.  She greatly enjoys taking care of her home and her cats.  She is described as very optimistic, does not typically like medical providers.  Functional and Nutritional State: Patient is independent with ADLs at baseline.  Advance Directives: A detailed discussion regarding advanced directives was had.  Patient's son actually have an appointment this upcoming Thursday with a lawyer to complete this, he will be very appreciative of assistance  during this admission if patient is alert and oriented.   Discussion: Met with patient's son and discussed the difference between palliative care and hospice care.  Explored patient's previous conversations about her goals of care and preferences.  She values her stability, personal space, and would never want to be dependent on artificial life support indefinitely.  She is very hopeful at this time that she will have a good outcome, although her son knows this is not as likely as she thinks.  She tends to focus on the most optimistic information provided to her and takes a long time to respond.  We discussed his questions regarding insurance coverage of different support services and different types of facilities.  He is in the process of hiring private caregivers and would like to take care of her at home as long as possible.  He is open to ongoing discussions and agrees it would be helpful for patient to have support to gradually come to acceptance regarding her diagnosis and prognosis.   The difference between aggressive medical intervention and comfort care was considered in light of the patient's goals of care. Hospice and Palliative Care services outpatient were explained and offered.   Discussed the importance of continued conversation with family and the medical providers regarding overall plan of care and treatment options, ensuring decisions are within the context of the patient's values and GOCs.   Questions and concerns were addressed.  Hard Choices booklet left for review. The family was encouraged to call with questions or concerns.  PMT will continue to support holistically.   SUMMARY OF RECOMMENDATIONS   -Continue full code/full scope -Ongoing goals of care discussions pending clinical course -PMT will continue to  follow and support  Prognosis:  Poor prognosis given high-grade glioma with acute worsening of symptoms  Discharge Planning: To Be Determined      Primary  Diagnoses: Present on Admission:  Glioblastoma multiforme of brain (HCC)  Stroke-like symptoms   Physical Exam Vitals and nursing note reviewed.  Constitutional:      General: She is not in acute distress.    Appearance: She is ill-appearing.  Cardiovascular:     Rate and Rhythm: Normal rate.  Pulmonary:     Effort: Pulmonary effort is normal.  Neurological:     Mental Status: She is lethargic and disoriented.    Vital Signs: BP (!) 153/76   Pulse 66   Temp 98.2 F (36.8 C) (Oral)   Resp (!) 22   SpO2 100%  Pain Scale: 0-10   Pain Score: 0-No pain   SpO2: SpO2: 100 % O2 Device:SpO2: 100 % O2 Flow Rate: .    Palliative Assessment/Data:     MDM: high   Fendi Meinhardt Johnnette Litter, PA-C  Palliative Medicine Team Team phone # [phone]  Thank you for allowing the Palliative Medicine Team to assist in the care of this patient. Please utilize secure chat with additional questions, if there is no response within 30 minutes please call the above phone number.  Palliative Medicine Team providers are available by phone from 7am to 7pm daily and can be reached through the team cell phone.  Should this patient require assistance outside of these hours, please call the patient's attending physician.

## 2022-07-01 NOTE — ED Triage Notes (Signed)
Pts husband brought pt to ED due to stroke like sx. Pt was getting hair done and suddenly became aphasic and right sided facial droop. LSN was 1030.

## 2022-07-01 NOTE — ED Provider Notes (Signed)
[facility] EMERGENCY DEPARTMENT Provider Note   CSN: [id number] Arrival date & time: 07/01/22  1318  An emergency department physician performed an initial assessment on this suspected stroke patient at 1325.  History  Chief Complaint  Patient presents with   Code Stroke    Analycia Martinez is a 76 y.o. female with medical history of glioblastoma recently diagnosed on 10/1.  The patient presents to the ED for evaluation of suspected strokelike activity.  The patient is present with her son who provides majority of the history.  The patient son states that the patient woke up this morning in her usual state of health.  The patient was excited because today she was going to have her hair done.  The patient son states that he went to go and pick the patient up and she was in her usual state of health.  The patient son states that on the way to her appointment the patient began to have "word salad" and more of a "slump ".  The patient's son states that the patient had an acute change in her ability to communicate as well as acutely worsening of eye twitching while she was having her hair done.  The patient son states that at that time he brought the patient to the ED for evaluation.  In triage, the patient was noted to have slight facial droop, and slurred speech and patient not acting normally.  On my examination the patient is somnolent as she has received 2 mg of Ativan.  The patient has been seen by neurology as a code stroke was activated in triage.  Dr. Erlinda Hong, neurology, has advised me to get the patient admitted to the hospitalist service and they will consult for possible seizure-like activity.  HPI     Home Medications Prior to Admission medications   Medication Sig Start Date End Date Taking? Authorizing Provider  B Complex Vitamins (VITAMIN B COMPLEX) TABS Take 1 tablet by mouth daily.   Yes [provider]  Cholecalciferol (VITAMIN D-1000 MAX ST) 25 MCG (1000 UT)  tablet Take 1,000 Units by mouth daily.   Yes [provider]  dexamethasone (DECADRON) 4 MG tablet Take 1 tablet (4 mg total) by mouth 2 (two) times daily. 06/20/22  Yes Judith Part, MD      Allergies    Codeine and Penicillins    Review of Systems   Review of Systems  Unable to perform ROS: Acuity of condition (Level 5 caveat)    Physical Exam Updated Vital Signs BP (!) 168/77   Pulse 78   Temp 98.2 F (36.8 C) (Oral)   Resp 18   SpO2 98%  Physical Exam Vitals and nursing note reviewed.  Constitutional:      General: She is sleeping. She is not in acute distress.    Appearance: She is well-developed. She is not toxic-appearing.  HENT:     Head: Normocephalic and atraumatic.     Nose: Nose normal.     Mouth/Throat:     Mouth: Mucous membranes are dry.     Pharynx: Oropharynx is clear.  Eyes:     Conjunctiva/sclera: Conjunctivae normal.  Cardiovascular:     Rate and Rhythm: Normal rate and regular rhythm.     Heart sounds: No murmur heard. Pulmonary:     Effort: Pulmonary effort is normal. No respiratory distress.     Breath sounds: Normal breath sounds.  Abdominal:     Palpations: Abdomen is soft.  Tenderness: There is no abdominal tenderness.  Musculoskeletal:        General: No swelling.     Cervical back: Neck supple.  Skin:    General: Skin is warm and dry.     Capillary Refill: Capillary refill takes less than 2 seconds.  Neurological:     Mental Status: She is lethargic.     ED Results / Procedures / Treatments   Labs (all labs ordered are listed, but only abnormal results are displayed) Labs Reviewed  APTT - Abnormal; Notable for the following components:      Result Value   aPTT 21 (*)    All other components within normal limits  CBC - Abnormal; Notable for the following components:   WBC 14.6 (*)    All other components within normal limits  DIFFERENTIAL - Abnormal; Notable for the following components:   Neutro Abs 9.1  (*)    Monocytes Absolute 1.4 (*)    Abs Immature Granulocytes 0.09 (*)    All other components within normal limits  COMPREHENSIVE METABOLIC PANEL - Abnormal; Notable for the following components:   Sodium 134 (*)    Glucose, Bld 176 (*)    BUN 28 (*)    Calcium 8.3 (*)    Total Protein 5.9 (*)    Albumin 3.3 (*)    Alkaline Phosphatase 26 (*)    All other components within normal limits  I-STAT CHEM 8, ED - Abnormal; Notable for the following components:   BUN 30 (*)    Glucose, Bld 178 (*)    All other components within normal limits  CBG MONITORING, ED - Abnormal; Notable for the following components:   Glucose-Capillary 158 (*)    All other components within normal limits  ETHANOL  PROTIME-INR  URINALYSIS, ROUTINE W REFLEX MICROSCOPIC  HEMOGLOBIN A1C    EKG EKG Interpretation  Date/Time:  Monday July 01 2022 13:59:56 EDT Ventricular Rate:  77 PR Interval:  149 QRS Duration: 99 QT Interval:  390 QTC Calculation: 442 R Axis:   61 Text Interpretation: Sinus rhythm Atrial premature complex Probable left atrial enlargement No previous ECGs available Confirmed by Leanord Asal ([id number]) on 07/01/2022 2:43:13 PM  Radiology CT VENOGRAM HEAD  Result Date: 07/01/2022 CLINICAL DATA:  Follow-up left-sided biopsy for tumor. EXAM: CT VENOGRAM HEAD TECHNIQUE: Venographic phase images of the brain were obtained following the administration of intravenous contrast. Multiplanar reformats and maximum intensity projections were generated. RADIATION DOSE REDUCTION: This exam was performed according to the departmental dose-optimization program which includes automated exposure control, adjustment of the mA and/or kV according to patient size and/or use of iterative reconstruction technique. CONTRAST:  58m OMNIPAQUE IOHEXOL 350 MG/ML SOLN COMPARISON:  Arterial study same day. Multiple other recent imaging studies beginning 06/16/2022. FINDINGS: Superior sagittal sinus is widely patent.  Deep venous system is widely patent. Transverse sinuses, sigmoid sinuses and jugular veins show flow. Normal appearing cavernous sinuses. As noted on the arterial study, there is a prominent vein on the left draining superficial from near the region of the biopsy. I would favor that this is a prominent superficial draining vein possibly due to hyperemia in the region. The unusual possibility of a post biopsy arteriovenous fistula is considered, but that would seem unlikely. IMPRESSION: 1. No evidence of dural venous sinus thrombosis. 2. Prominent superficial draining vein on the left near the region of the biopsy. I would favor that this is a prominent draining vein possibly due to hyperemia in the region.  The unusual possibility of a post biopsy arteriovenous fistula is considered, but that would seem unlikely. Electronically Signed   By: Nelson Chimes M.D.   On: 07/01/2022 14:20   CT ANGIO HEAD NECK W WO CM (CODE STROKE)  Result Date: 07/01/2022 CLINICAL DATA:  Neuro deficit, acute, stroke suspected EXAM: CT ANGIOGRAPHY HEAD AND NECK TECHNIQUE: Multidetector CT imaging of the head and neck was performed using the standard protocol during bolus administration of intravenous contrast. Multiplanar CT image reconstructions and MIPs were obtained to evaluate the vascular anatomy. Carotid stenosis measurements (when applicable) are obtained utilizing NASCET criteria, using the distal internal carotid diameter as the denominator. RADIATION DOSE REDUCTION: This exam was performed according to the departmental dose-optimization program which includes automated exposure control, adjustment of the mA and/or kV according to patient size and/or use of iterative reconstruction technique. CONTRAST:  72m OMNIPAQUE IOHEXOL 350 MG/ML SOLN COMPARISON:  Multiple recent imaging studies including head CT earlier same day. FINDINGS: CTA NECK FINDINGS Aortic arch: Aortic atherosclerosis, mild. Branching pattern is normal. Right  carotid system: Common carotid artery widely patent to the bifurcation. Minimal plaque at the ICA bulb but no stenosis. Cervical ICA widely patent. Left carotid system: Common carotid artery widely patent to the bifurcation. Normal appearance of the bifurcation and ICA bulb. Cervical ICA is normal. Vertebral arteries: Both vertebral artery origins are widely patent. Both vertebral arteries are normal through the cervical region to the foramen magnum. Skeleton: Normal Other neck: No mass or lymphadenopathy. Upper chest: Lung apices are clear. Review of the MIP images confirms the above findings CTA HEAD FINDINGS Anterior circulation: Both internal carotid arteries are patent through the skull base and siphon regions. No siphon stenosis. The anterior and middle cerebral vessels are patent. No large vessel occlusion or proximal stenosis. No aneurysm. There is a prominent vein extending superficial from the region of the operative approach. This could represent a normal vein that is well seen due to regional hyperemia, but the possibility of a small postoperative arteriovenous fistula is entertained. That would seem unlikely. Posterior circulation: Both vertebral arteries widely patent to the basilar. No basilar stenosis. Posterior circulation branch vessels are normal. Venous sinuses: Patent and normal. Anatomic variants: None significant. Review of the MIP images confirms the above findings IMPRESSION: 1. No large vessel occlusion or proximal stenosis. No aneurysm. 2. Prominent vein extending superficial from the region of the operative approach. This could represent a normal vein that is prominent due to regional hyperemia, but the possibility of a small postoperative arteriovenous fistula is entertained. That would seem unlikely. 3. Aortic atherosclerosis. Aortic Atherosclerosis (ICD10-I70.0). Electronically Signed   By: MNelson ChimesM.D.   On: 07/01/2022 14:17   CT HEAD CODE STROKE WO CONTRAST  Result Date:  07/01/2022 CLINICAL DATA:  Code stroke. EXAM: CT HEAD WITHOUT CONTRAST TECHNIQUE: Contiguous axial images were obtained from the base of the skull through the vertex without intravenous contrast. RADIATION DOSE REDUCTION: This exam was performed according to the departmental dose-optimization program which includes automated exposure control, adjustment of the mA and/or kV according to patient size and/or use of iterative reconstruction technique. COMPARISON:  CT head June 18, 2022. FINDINGS: Brain: No clear evidence of acute large vascular territory infarct. Small volume of hyperdense hemorrhage coursing linearly subjacent to the left-sided burr hole, probably a combination of intraparenchymal and extra-axial. Known infiltrating tumor better characterized on prior MRI. Similar mass effect with approximately 7 mm of rightward midline shift. No evidence of hydrocephalus. Vascular: No hyperdense  vessel identified. Skull: No acute fracture.  Left burr hole. Sinuses/Orbits: No acute findings. Other: No mastoid effusions. IMPRESSION: 1. Small volume of hyperdense hemorrhage coursing linearly subjacent to the left-sided burr hole (probably a combination of intraparenchymal and extra-axial) and likely related to recent biopsy. Recommend short interval follow-up CT head to ensure stability. 2. No clear evidence of acute large vascular territory infarct 3. Known infiltrating tumor better characterized on prior MRI. Similar mass effect with approximately 7 mm of rightward midline shift. Findings discussed with Dr. Leonel Ramsay via telephone at 1:52 PM Electronically Signed   By: Margaretha Sheffield M.D.   On: 07/01/2022 13:56    Procedures Procedures   Medications Ordered in ED Medications  levETIRAcetam (KEPPRA) IVPB 1500 mg/ 100 mL premix (0 mg Intravenous Stopped 07/01/22 1459)    Followed by  levETIRAcetam (KEPPRA) IVPB 500 mg/100 mL premix (has no administration in time range)  dexamethasone (DECADRON)  injection 4 mg (4 mg Intravenous Given 07/01/22 1447)  acetaminophen (TYLENOL) tablet 650 mg (has no administration in time range)    Or  acetaminophen (TYLENOL) 160 MG/5ML solution 650 mg (has no administration in time range)    Or  acetaminophen (TYLENOL) suppository 650 mg (has no administration in time range)  senna-docusate (Senokot-S) tablet 1 tablet (has no administration in time range)  insulin aspart (novoLOG) injection 0-9 Units (has no administration in time range)  LORazepam (ATIVAN) 2 MG/ML injection (2 mg  Given 07/01/22 1358)  iohexol (OMNIPAQUE) 350 MG/ML injection 50 mL (50 mLs Intravenous Contrast Given 07/01/22 1356)    ED Course/ Medical Decision Making/ A&P                           Medical Decision Making Risk Decision regarding hospitalization.   76 year old female presents to the ED for evaluation.  Please see HPI for further details.  On my examination the patient is somnolent, sleeping in bed after receiving 2 mg of Ativan.  The patient is afebrile and nontachycardic.  The patient lung sounds are clear bilaterally, she is not hypoxic on room air.  The patient abdomen soft and compressible throughout.  Patient was seen in triage and a code stroke was initiated at this time due to patient presentation.  Dr. Erlinda Hong of neurology came down to see the patient and advised me to get this patient admitted to the hospitalist team for further management.  Neurology doctor stated that they will follow for MRI, EEG, seizure work-up.  Dr. Erlinda Hong requested that I begin the patient on Keppra load which has been completed.  The patient has been discussed with hospitalist Dr. Lorin Mercy and she has agreed to admit the patient for further management.  Patient stable at time of admission.  Final Clinical Impression(s) / ED Diagnoses Final diagnoses:  Dysarthria    Rx / DC Orders ED Discharge Orders     None         Azucena Cecil, PA-C 07/01/22 [place], [place], [place] 07/01/22 [phone]

## 2022-07-01 NOTE — ED Provider Triage Note (Signed)
Emergency Medicine Provider Triage Evaluation Note  Cozetta Seif , a 76 y.o. female  was evaluated in triage.  Pt complains of  Last known well 3 hours ago.  Family member at bedside states that she began speaking abnormally at her hair appointment.  Has new diagnosis of glioblastoma.  Coming in with change in speech and family member states that she is not responding per her normal.  Review of Systems  Positive:  Negative:   Physical Exam  There were no vitals taken for this visit. Gen:   Awake, no distress   Resp:  Normal effort  MSK:   Moves extremities without difficulty  Other:  She does have some left-sided facial asymmetry, dysarthria.  No pronator drift.  Will call code stroke.  Medical Decision Making  Medically screening exam initiated at 1:26 PM.  Appropriate orders placed.  Jocabed Cheese was informed that the remainder of the evaluation will be completed by another provider, this initial triage assessment does not replace that evaluation, and the importance of remaining in the ED until their evaluation is complete.  Code stroke initiated in triage   Mickie Hillier, PA-C 07/01/22 1327

## 2022-07-01 NOTE — Code Documentation (Signed)
Stroke Response Nurse Documentation Code Documentation  Miranda Martinez is a 76 y.o. female arriving to [facility]  via [employer] on 07/01/2022 with past medical hx of brain tumor diagnosed on 10/1. Code stroke was activated by ED.   Patient from home where she was LKW at 1000 and now complaining of trouble speaking and right sided facial. Pt was getting her hair done when she had a sudden onset of trouble speaking and right facial droop. Family stated her personality changed and she wasn't acting herself. They drove her to the ED.   Stroke team at the bedside after patient activation. Labs drawn and patient cleared for CT by Lauren PA. Patient to CT with team. NIHSS 5, see documentation for details and code stroke times. Patient with disoriented, not following commands, right facial droop, Global aphasia , and dysarthria  on exam. The following imaging was completed:  CT Head and CTA. Patient is not a candidate for IV Thrombolytic due to brain tumor. Patient is not a candidate for IR due to same.   Bedside handoff with ED RN [place].    Kathrin Greathouse  Stroke Response RN

## 2022-07-01 NOTE — H&P (Signed)
History and Physical    Patient: Miranda Martinez XBJ:[id number] DOB: 13-Apr-1946 DOA: 07/01/2022 DOS: the patient was seen and examined on 07/01/2022 PCP: [name], DO  Patient coming from: Home - lives alone, son is staying with her; NOK:    Chief Complaint: stroke-like symptoms  HPI: Miranda Martinez is a 76 y.o. female with medical history significant of recent diagnosis of high-grade glioma presenting with stroke-like symptoms.  Started with symptoms last summer, previously very independent, "willful."  She led a very healthy lifestyle - no OTCs, no drinking/smoking.  She talked about a moment of "freezing" in May/June.  Things were "a little off" - she had a bit of word salad, unusual word choices.  She had 2 spider bites, very allergic to spiders, 1 got infected and she was given Doxy.  It made her more confused, "not very precise in her articulation", had verbal decline over the summer.  Had a sweet taste in her mouth.  Didn't eat/drink - lost 34 pounds in 6 weeks.  They finally went to [facility] 2 weeks ago, CT with mass.  Transported to ER, MRI with 41m tumor. Brain biopsy with high grade glioma.  Neuroonc is scheduled for this Thursday with Dr. VMickeal Skinner  She is staying with her son - functioning independently.  Gait, balance ok.  Mild tingling in left foot.  Independent with ADLs and IADLs.  Very good with receptive language, struggling with expressive language.  She has been cyclical when she is more articulate and has clarity, focus.  This AM, she seemed baseline.  About 10, she was a "little fuzzier" with "word salad".  She was looking forward to a hair appointment.  At 1130, she didn't want to eat, had difficulty articulating.  She nodded that she wanted to go. They got to the salon and she suddenly couldn't remember how to get out of the seatbelt, get out of the car, generalized weakness.  He helped her in, she sat in the chair, didn't really recognize the stylist.  Her son decided to bring her in -  she was not responding, didn't seem to know he was there, didn't know what to do with food.      ER Course:  Neurology has seen - admit for seizure vs. Progression of known glioblastoma.  Admitted earlier this month s/p biopsy.  Normal this AM -> word salad, slumping -> aphasia, R facial droop, tremor of R eye.  Called code stroke.  Imaging shows stable findings, Dr. XErlinda Hongordered MRI and EEG.  Loaded with Keppra, given Decadron.      Review of Systems: unable to review all systems due to the inability of the patient to answer questions. Past Medical History:  Diagnosis Date   Glioma of brain ([facility]    Past Surgical History:  Procedure Laterality Date   APPLICATION OF CRANIAL NAVIGATION Left 06/18/2022   Procedure: APPLICATION OF CRANIAL NAVIGATION;  Surgeon: OJudith Part MD;  Location: [facility]  Service: Neurosurgery;  Laterality: Left;   PR DURAL GRAFT REPAIR,SPINE DEFECT Left 06/18/2022   Procedure: FRAMELESS STEREOTACTIC BIOPSY WITH STEALTH;  Surgeon: OJudith Part MD;  Location: [facility]  Service: Neurosurgery;  Laterality: Left;   Social History:  reports that she has never smoked. She has never used smokeless tobacco. She reports that she does not drink alcohol and does not use drugs.  Allergies  Allergen Reactions   Codeine Itching   Penicillins Itching    Family History  Family history unknown: Yes  Prior to Admission medications   Medication Sig Start Date End Date Taking? Authorizing Provider  B Complex Vitamins (VITAMIN B COMPLEX) TABS Take 1 tablet by mouth daily.   Yes [provider]  Cholecalciferol (VITAMIN D-1000 MAX ST) 25 MCG (1000 UT) tablet Take 1,000 Units by mouth daily.   Yes [provider]  dexamethasone (DECADRON) 4 MG tablet Take 1 tablet (4 mg total) by mouth 2 (two) times daily. 06/20/22  Yes Judith Part, MD    Physical Exam: Vitals:   07/01/22 1410 07/01/22 1415 07/01/22 1430 07/01/22 1445  BP:  (!) 178/71 (!)  174/75 (!) 168/77  Pulse: 83 87 94 78  Resp: (!) 23 17 (!) 21 18  Temp:      TempSrc:      SpO2: 98% 98% 98% 98%   General:  Appears sedated, withdraws from pain only Eyes:  normal lids, closed throughout evaluation ENT:  grossly normal lips & tongue, mmm Neck:  no LAD, masses or thyromegaly Cardiovascular:  RRR, no m/r/g. No LE edema.  Respiratory:   CTA bilaterally with no wheezes/rales/rhonchi.  Normal respiratory effort. Abdomen:  soft, NT, ND Skin:  no rash or induration seen on limited exam Musculoskeletal:   no bony abnormality Psychiatric:  sedated, withdraws from pain Neurologic:  unable to effectively perform   Radiological Exams on Admission: Independently reviewed - see discussion in A/P where applicable  CT VENOGRAM HEAD  Result Date: 07/01/2022 CLINICAL DATA:  Follow-up left-sided biopsy for tumor. EXAM: CT VENOGRAM HEAD TECHNIQUE: Venographic phase images of the brain were obtained following the administration of intravenous contrast. Multiplanar reformats and maximum intensity projections were generated. RADIATION DOSE REDUCTION: This exam was performed according to the departmental dose-optimization program which includes automated exposure control, adjustment of the mA and/or kV according to patient size and/or use of iterative reconstruction technique. CONTRAST:  12m OMNIPAQUE IOHEXOL 350 MG/ML SOLN COMPARISON:  Arterial study same day. Multiple other recent imaging studies beginning 06/16/2022. FINDINGS: Superior sagittal sinus is widely patent. Deep venous system is widely patent. Transverse sinuses, sigmoid sinuses and jugular veins show flow. Normal appearing cavernous sinuses. As noted on the arterial study, there is a prominent vein on the left draining superficial from near the region of the biopsy. I would favor that this is a prominent superficial draining vein possibly due to hyperemia in the region. The unusual possibility of a post biopsy arteriovenous  fistula is considered, but that would seem unlikely. IMPRESSION: 1. No evidence of dural venous sinus thrombosis. 2. Prominent superficial draining vein on the left near the region of the biopsy. I would favor that this is a prominent draining vein possibly due to hyperemia in the region. The unusual possibility of a post biopsy arteriovenous fistula is considered, but that would seem unlikely. Electronically Signed   By: MNelson ChimesM.D.   On: 07/01/2022 14:20   CT ANGIO HEAD NECK W WO CM (CODE STROKE)  Result Date: 07/01/2022 CLINICAL DATA:  Neuro deficit, acute, stroke suspected EXAM: CT ANGIOGRAPHY HEAD AND NECK TECHNIQUE: Multidetector CT imaging of the head and neck was performed using the standard protocol during bolus administration of intravenous contrast. Multiplanar CT image reconstructions and MIPs were obtained to evaluate the vascular anatomy. Carotid stenosis measurements (when applicable) are obtained utilizing NASCET criteria, using the distal internal carotid diameter as the denominator. RADIATION DOSE REDUCTION: This exam was performed according to the departmental dose-optimization program which includes automated exposure control, adjustment of the mA and/or kV  according to patient size and/or use of iterative reconstruction technique. CONTRAST:  89m OMNIPAQUE IOHEXOL 350 MG/ML SOLN COMPARISON:  Multiple recent imaging studies including head CT earlier same day. FINDINGS: CTA NECK FINDINGS Aortic arch: Aortic atherosclerosis, mild. Branching pattern is normal. Right carotid system: Common carotid artery widely patent to the bifurcation. Minimal plaque at the ICA bulb but no stenosis. Cervical ICA widely patent. Left carotid system: Common carotid artery widely patent to the bifurcation. Normal appearance of the bifurcation and ICA bulb. Cervical ICA is normal. Vertebral arteries: Both vertebral artery origins are widely patent. Both vertebral arteries are normal through the cervical  region to the foramen magnum. Skeleton: Normal Other neck: No mass or lymphadenopathy. Upper chest: Lung apices are clear. Review of the MIP images confirms the above findings CTA HEAD FINDINGS Anterior circulation: Both internal carotid arteries are patent through the skull base and siphon regions. No siphon stenosis. The anterior and middle cerebral vessels are patent. No large vessel occlusion or proximal stenosis. No aneurysm. There is a prominent vein extending superficial from the region of the operative approach. This could represent a normal vein that is well seen due to regional hyperemia, but the possibility of a small postoperative arteriovenous fistula is entertained. That would seem unlikely. Posterior circulation: Both vertebral arteries widely patent to the basilar. No basilar stenosis. Posterior circulation branch vessels are normal. Venous sinuses: Patent and normal. Anatomic variants: None significant. Review of the MIP images confirms the above findings IMPRESSION: 1. No large vessel occlusion or proximal stenosis. No aneurysm. 2. Prominent vein extending superficial from the region of the operative approach. This could represent a normal vein that is prominent due to regional hyperemia, but the possibility of a small postoperative arteriovenous fistula is entertained. That would seem unlikely. 3. Aortic atherosclerosis. Aortic Atherosclerosis (ICD10-I70.0). Electronically Signed   By: MNelson ChimesM.D.   On: 07/01/2022 14:17   CT HEAD CODE STROKE WO CONTRAST  Result Date: 07/01/2022 CLINICAL DATA:  Code stroke. EXAM: CT HEAD WITHOUT CONTRAST TECHNIQUE: Contiguous axial images were obtained from the base of the skull through the vertex without intravenous contrast. RADIATION DOSE REDUCTION: This exam was performed according to the departmental dose-optimization program which includes automated exposure control, adjustment of the mA and/or kV according to patient size and/or use of iterative  reconstruction technique. COMPARISON:  CT head June 18, 2022. FINDINGS: Brain: No clear evidence of acute large vascular territory infarct. Small volume of hyperdense hemorrhage coursing linearly subjacent to the left-sided burr hole, probably a combination of intraparenchymal and extra-axial. Known infiltrating tumor better characterized on prior MRI. Similar mass effect with approximately 7 mm of rightward midline shift. No evidence of hydrocephalus. Vascular: No hyperdense vessel identified. Skull: No acute fracture.  Left burr hole. Sinuses/Orbits: No acute findings. Other: No mastoid effusions. IMPRESSION: 1. Small volume of hyperdense hemorrhage coursing linearly subjacent to the left-sided burr hole (probably a combination of intraparenchymal and extra-axial) and likely related to recent biopsy. Recommend short interval follow-up CT head to ensure stability. 2. No clear evidence of acute large vascular territory infarct 3. Known infiltrating tumor better characterized on prior MRI. Similar mass effect with approximately 7 mm of rightward midline shift. Findings discussed with Dr. KLeonel Ramsayvia telephone at 1:52 PM Electronically Signed   By: FMargaretha SheffieldM.D.   On: 07/01/2022 13:56    EKG: Independently reviewed.  NSR with rate 77; no evidence of acute ischemia   Labs on Admission: I have personally reviewed the available  labs and imaging studies at the time of the admission.  Pertinent labs:    Glucose 176 Albumin 3.3 WBC 14.6 INR 1.1   Assessment and Plan: Principal Problem:   Glioblastoma multiforme of brain (HCC) Active Problems:   Stroke-like symptoms    Stroke-like symptoms, known high-grade glioma -Patient with prior admission from 10/1-5 for worsening confusion and word-finding difficulty -She was found at that time to have a brain mass and was transferred from [facility] to [facility] for brain biopsy -Biopsy was performed on 10/3 and showed a high grade glial neoplasm; further  diagnostic evaluation is still pending -Meanwhile, she presented here today with worsening neurologic symptoms -CT shows a small volume of hemorrhage adjacent to the Wray hole, likely related to the biopsy as well as tumor with similar mass effect, 7 mm midline shift -CTA/venogram with a prominent vein with from regional hyperemia or as a post-operative AV fistula (unlikely) -She has been seen by neurology -Most likely her neuro changes are associated with edema vs. New-onset seizure activty -She has been loaded with Keppra -MRI is ordered/pending -I have notified neuroonc (Dr. [name]) and neurosurgery (Dr. Zada Finders) via Secure Chat to see if they have anything to offer at this time -She is currently obtunded from Ativan but I did initiate a [place] conversation with her son (a psychologist who is incredibly knowledgeable) -She is currently full code but they are realistic about her prognosis -They are open to palliative care consultation -PT/OT/ST consults -[facility] team consult -Will admit to progressive care (Neuroprogressive) at this time    Advance Care Planning:   Code Status: Full Code   Consults: Neurology; neurooncology (telephone only?); neuosurgery (will see her); PT/OT/ST; TOC team  DVT Prophylaxis: SCDs  Family Communication: Son was present throughout evaluation  Severity of Illness: The appropriate patient status for this patient is INPATIENT. Inpatient status is judged to be reasonable and necessary in order to provide the required intensity of service to ensure the patient's safety. The patient's presenting symptoms, physical exam findings, and initial radiographic and laboratory data in the context of their chronic comorbidities is felt to place them at high risk for further clinical deterioration. Furthermore, it is not anticipated that the patient will be medically stable for discharge from the hospital within 2 midnights of admission.   * I certify that at the point of  admission it is my clinical judgment that the patient will require inpatient hospital care spanning beyond 2 midnights from the point of admission due to high intensity of service, high risk for further deterioration and high frequency of surveillance required.*  Author: Karmen Bongo, MD 07/01/2022 3:39 PM  For on call review [email or web address].

## 2022-07-01 NOTE — ED Notes (Signed)
Pts O2 was 88% on room air, RN placed pt on 4L

## 2022-07-01 NOTE — Progress Notes (Signed)
LTM EEG hooked up and running - no initial skin breakdown - push button tested - neuro notified. [facility] NOT monitoring.  Pt in ED

## 2022-07-02 DIAGNOSIS — R2990 Unspecified symptoms and signs involving the nervous system: Secondary | ICD-10-CM | POA: Diagnosis not present

## 2022-07-02 DIAGNOSIS — R471 Dysarthria and anarthria: Secondary | ICD-10-CM | POA: Diagnosis not present

## 2022-07-02 DIAGNOSIS — Z7189 Other specified counseling: Secondary | ICD-10-CM | POA: Diagnosis not present

## 2022-07-02 DIAGNOSIS — R569 Unspecified convulsions: Secondary | ICD-10-CM | POA: Diagnosis not present

## 2022-07-02 DIAGNOSIS — C719 Malignant neoplasm of brain, unspecified: Secondary | ICD-10-CM | POA: Diagnosis not present

## 2022-07-02 LAB — URINALYSIS, ROUTINE W REFLEX MICROSCOPIC
Bilirubin Urine: NEGATIVE
Glucose, UA: NEGATIVE mg/dL
Ketones, ur: NEGATIVE mg/dL
Nitrite: NEGATIVE
Protein, ur: 100 mg/dL — AB
Specific Gravity, Urine: 1.043 — ABNORMAL HIGH (ref 1.005–1.030)
pH: 6 (ref 5.0–8.0)

## 2022-07-02 LAB — CBC WITH DIFFERENTIAL/PLATELET
Abs Immature Granulocytes: 0.15 10*3/uL — ABNORMAL HIGH (ref 0.00–0.07)
Basophils Absolute: 0 10*3/uL (ref 0.0–0.1)
Basophils Relative: 0 %
Eosinophils Absolute: 0 10*3/uL (ref 0.0–0.5)
Eosinophils Relative: 0 %
HCT: 42.3 % (ref 36.0–46.0)
Hemoglobin: 14.7 g/dL (ref 12.0–15.0)
Immature Granulocytes: 1 %
Lymphocytes Relative: 3 %
Lymphs Abs: 0.6 10*3/uL — ABNORMAL LOW (ref 0.7–4.0)
MCH: 33.5 pg (ref 26.0–34.0)
MCHC: 34.8 g/dL (ref 30.0–36.0)
MCV: 96.4 fL (ref 80.0–100.0)
Monocytes Absolute: 1.4 10*3/uL — ABNORMAL HIGH (ref 0.1–1.0)
Monocytes Relative: 6 %
Neutro Abs: 19.8 10*3/uL — ABNORMAL HIGH (ref 1.7–7.7)
Neutrophils Relative %: 90 %
Platelets: 260 10*3/uL (ref 150–400)
RBC: 4.39 MIL/uL (ref 3.87–5.11)
RDW: 12.8 % (ref 11.5–15.5)
WBC: 22 10*3/uL — ABNORMAL HIGH (ref 4.0–10.5)
nRBC: 0 % (ref 0.0–0.2)

## 2022-07-02 LAB — CBG MONITORING, ED
Glucose-Capillary: 175 mg/dL — ABNORMAL HIGH (ref 70–99)
Glucose-Capillary: 181 mg/dL — ABNORMAL HIGH (ref 70–99)
Glucose-Capillary: 184 mg/dL — ABNORMAL HIGH (ref 70–99)
Glucose-Capillary: 157 mg/dL — ABNORMAL HIGH (ref 70–99)

## 2022-07-02 LAB — BASIC METABOLIC PANEL
Anion gap: 8 (ref 5–15)
BUN: 23 mg/dL (ref 8–23)
CO2: 23 mmol/L (ref 22–32)
Calcium: 8.9 mg/dL (ref 8.9–10.3)
Chloride: 102 mmol/L (ref 98–111)
Creatinine, Ser: 0.6 mg/dL (ref 0.44–1.00)
GFR, Estimated: >60 mL/min (ref >60)
Glucose, Bld: 170 mg/dL — ABNORMAL HIGH (ref 70–99)
Potassium: 4 mmol/L (ref 3.5–5.1)
Sodium: 133 mmol/L — ABNORMAL LOW (ref 135–145)

## 2022-07-02 LAB — MAGNESIUM: Magnesium: 2.3 mg/dL (ref 1.7–2.4)

## 2022-07-02 MED ORDER — LORAZEPAM 2 MG/ML IJ SOLN: 1.0000 mg | Freq: Once | INTRAMUSCULAR | Status: DC

## 2022-07-02 MED ORDER — LEVETIRACETAM IN NACL 1500 MG/100ML IV SOLN
1500.0000 mg | Freq: Two times a day (BID) | INTRAVENOUS | Status: DC
  Administered 2022-07-03 – 2022-07-10 (×16): 1500 mg via INTRAVENOUS
  Filled 2022-07-02 (×18): qty 100

## 2022-07-02 MED ORDER — SODIUM CHLORIDE 0.9 % IV SOLN: INTRAVENOUS | Status: DC

## 2022-07-02 MED ORDER — SODIUM CHLORIDE 0.9 % IV SOLN
200.0000 mg | Freq: Two times a day (BID) | INTRAVENOUS | Status: DC
  Administered 2022-07-02 – 2022-07-10 (×16): 200 mg via INTRAVENOUS
  Filled 2022-07-02 (×17): qty 20

## 2022-07-02 MED ORDER — PHENYTOIN SODIUM 50 MG/ML IJ SOLN: 100.0000 mg | Freq: Three times a day (TID) | INTRAMUSCULAR | Status: DC

## 2022-07-02 MED ORDER — SODIUM CHLORIDE 0.9 % IV SOLN
20.0000 mg/kg | Freq: Once | INTRAVENOUS | Status: AC
  Administered 2022-07-02: 1160 mg via INTRAVENOUS
  Filled 2022-07-02: qty 23.2

## 2022-07-02 MED ORDER — PHENYTOIN SODIUM 50 MG/ML IJ SOLN
100.0000 mg | Freq: Three times a day (TID) | INTRAMUSCULAR | Status: DC
  Administered 2022-07-02 – 2022-07-05 (×9): 100 mg via INTRAVENOUS
  Filled 2022-07-02 (×12): qty 2

## 2022-07-02 MED ORDER — LEVETIRACETAM IN NACL 1000 MG/100ML IV SOLN
1000.0000 mg | Freq: Once | INTRAVENOUS | Status: AC
  Administered 2022-07-02: 1000 mg via INTRAVENOUS
  Filled 2022-07-02: qty 100

## 2022-07-02 NOTE — ED Notes (Signed)
Soiled brief and underpad changed. Peri care performed. Warm blankets given. Call bell within reach. Family at bedside.

## 2022-07-02 NOTE — Procedures (Signed)
Patient Name: Miranda Martinez  MRN: [id number]  Epilepsy Attending: Lora Havens  Referring Physician/Provider: Judith Part, MD  Duration: 07/01/2022 1755 to 07/02/2022 1755  Patient history: 76 y.o. 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 EEG to evaluate for seizure.   Level of alertness:  lethargic   AEDs during EEG study: LEV  Technical aspects: This EEG study was done with scalp electrodes positioned according to the 10-20 International system of electrode placement. Electrical activity was reviewed with band pass filter of 1-'70Hz'$ , sensitivity of 7 uV/mm, display speed of 24m/sec with a '60Hz'$  notched filter applied as appropriate. EEG data were recorded continuously and digitally stored.  Video monitoring was available and reviewed as appropriate.  Description: The posterior dominant rhythm consists of 8 Hz activity of moderate voltage (25-35 uV) seen predominantly in posterior head regions, asymmetric and reactive to eye opening and eye closing.  EEG also showed continuous 3 to 5 Hz theta-delta slowing in left hemisphere as well as intermittent 3 to 5 Hz theta-delta slowing in right hemisphere admixed with 15 to 18 Hz generalized beta activity.  At the beginning of the study, lateralized periodic discharges at 0.25 to '1hz'$  were noted in left hemisphere. Gradually, the periodic discharges appeared more frequent and at times appeared rhythmic lasting 3 to 5 seconds consistent with brief ictal-interictal rhythmic discharges. After around 1 AM on 07/02/2022 , seizures without clinical signs were noted in left hemisphere. Gradually, the frequency of seizures increased and occupied more than 20% of the recording, therefore consistent with electrographic status epilepticus.  As medications were adjusted, status epilepticus resolved after around 1400 on 07/02/2022.  EEG then showed  continuous generalized and lateralized left hemisphere 3 to 5 Hz theta and delta slowing admixed with 12 to 14 Hz generalized beta activity.  Sharp waves were noted in left frontal region which appeared qasi-periodic at 0.5-1 Hz  Hyperventilation and photic stimulation were not performed.     ABNORMALITY - Electrographic status epilepticus, left hemisphere - Continuous slow, left hemisphere - Intermittent slow, right hemisphere  IMPRESSION: This study was initially suggestive of cortical dysfunction and epileptogenicity in left hemisphere with increased risk of seizures.  After around 1 AM on 07/02/2022, seizures without clinical signs were noted arising from left hemisphere. Gradually, the frequency of seizures increased and occupied more than 20% of the recording and therefore consistent with electrographic status epilepticus.  As medications were adjusted, status epilepticus resolved 1400 on 10/05/2021.  EEG was then suggestive of cortical dysfunction and a epileptogenicity in left hemisphere with increased risk of seizures.   Additionally there is mild to moderate diffuse encephalopathy, likely related to seizures.  Dr KLeonel Ramsaywas notified.   Kennth Vanbenschoten OBarbra Sarks

## 2022-07-02 NOTE — ED Notes (Signed)
Unable to complete neuro assessment, attempt by this RN and RN Crystal to wake pt for assessment. Admitting MD paged.

## 2022-07-02 NOTE — ED Notes (Signed)
Went into pt's room for hourly rounding and pt was sitting at the end of the bed with multiple leads off. Pt assisted back into bed, fall precautions in place. Charge RN notified of event and requested Air cabin crew. VSS.

## 2022-07-02 NOTE — ED Notes (Signed)
Per MD Bridgett Larsson, page if pt has any other decline in mental status. No orders at this time.

## 2022-07-02 NOTE — Progress Notes (Signed)
Subjective: Patient with increasing seizure frequency on EEG, now quite frequent  Exam: Vitals:   07/02/22 0715 07/02/22 0730  BP: (!) 153/68   Pulse: (!) 56   Resp: 16   Temp:  97.8 F (36.6 C)  SpO2: 99%    Gen: In bed, NAD Resp: non-labored breathing, no acute distress Abd: soft, nt  Neuro: MS: Will open eyes and fixate examiner, will not reliably follow commands, with pinching she does say "ow" CN: Pupils are reactive bilaterally, keeps eyes tightly closed making blink to threat difficult to test, right facial decreased nasolabial fold Motor: She does move all extremities voluntarily, though I have the impression that she is moving the right side less than the left Sensory: She does respond to noxious stimulation x4  Pertinent Labs: Sodium 134 Creatinine 0.75 Calcium 8.3 with an albumin of 3.3   Impression: 76 year old female with a history of recent brain tumor biopsy who presents with recurrent seizures, now with very frequent 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  I will treat with benzodiazepine as well as fosphenytoin load.  Recommendations: 1) Ativan 1 mg x 1 2) fosphenytoin 20 PE per kilogram, followed by 100 mg 3 times daily 3) continue dexamethasone 4) increase Keppra to 1500 mg twice daily  Roland Rack, MD [facility] [phone]  If 7pm- 7am, please page neurology on call as listed in [place].

## 2022-07-02 NOTE — Progress Notes (Signed)
PT Cancellation Note  Patient Details Name: Madelene Kaatz MRN: [id number] DOB: [birth year]   Cancelled Treatment:    Reason Eval/Treat Not Completed: Medical issues which prohibited therapy Pt with frequent seizure activity and not appropriate for PT. Will follow up as schedule allows.   Lou Miner, DPT  Acute Rehabilitation Services  Office: [phone]    Rudean Hitt 07/02/2022, 10:46 AM

## 2022-07-02 NOTE — Progress Notes (Signed)
PROGRESS NOTE  Miranda Martinez CBJ:[id number] DOB: Sep 09, 1946 DOA: 07/01/2022 PCP: [name], DO  HPI/Recap of past 24 hours: Miranda Martinez is a 76 y.o. female with medical history significant of recent diagnosis of high-grade glioma presenting with stroke-like/ seizure like symptoms. Neuro-onc is scheduled for a follow up with Dr. [name] on 10/19. She is staying with her son  and has been functioning independently, until morning PTA when she was noted to be a "little fuzzier" with "word salad" and generalized weakness.  In the ED, word salad, slumping -> aphasia, R facial droop, tremor of R eye. Called code stroke. CT shows stable findings of high-grade glioma. Neurology consulted.  Patient admitted for further management.    Today, patient was unable to have a meaningful conversation, likely due to expressive aphasia, but was able to follow some simple commands.   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 [name] scheduled for 07/04/2022 Repeat MRI pending Overnight EEG with video showed increase in seizure frequency Neurosurgery, neuro-onc consulted, appreciate recs Neurology consulted, start fosphenytoin, increase Keppra, continue dexamethasone N.p.o. Gentle IV fluids PT/OT/SLP, TOC Monitor closely in the progressive floor  Leukocytosis Likely reaction in the setting of status epilepticus Afebrile UA with moderate leukocytes, many bacteria UC pending collection [facility] x2 pending collection Monitor closely  Goals of care discussion Poor prognosis given high-grade glioma, now with status epilepticus Palliative consulted, now DNR    Estimated body mass index is 21.95 kg/m as  calculated from the following:   Height as of 06/18/22: '5\' 4"'$  (1.626 m).   Weight as of 06/18/22: 58 kg.     Code Status: DNR  Family Communication: None at bedside  Disposition Plan: Status is: Inpatient Remains inpatient appropriate because: Level of care      Consultants: Neurology Neurosurgery Neuro-onc Palliative care   Procedures: Overnight video EEG  Antimicrobials: None  DVT prophylaxis: SCDs   Objective: Vitals:   07/02/22 1200 07/02/22 1222 07/02/22 1300 07/02/22 1400  BP: 117/84  128/71 128/67  Pulse: 71  (!) 58 61  Resp:   19   Temp:  (!) 96.8 F (36 C)    TempSrc:  Axillary    SpO2: 99%  97% 100%    Intake/Output Summary (Last 24 hours) at 07/02/2022 1506 Last data filed at 07/02/2022 1425 Gross per 24 hour  Intake 126.66 ml  Output --  Net 126.66 ml   There were no vitals filed for this visit.  Exam: General: NAD, expressive aphasia, follows some simple commands Cardiovascular: S1, S2 present Respiratory: CTAB Abdomen: Soft, nontender, nondistended, bowel sounds present Musculoskeletal: No bilateral pedal edema noted Skin: Normal Psychiatry: Unable to assess    Data Reviewed: CBC: Recent Labs  Lab 07/01/22 1330 07/01/22 1342 07/02/22 1050  WBC 14.6*  --  22.0*  NEUTROABS 9.1*  --  19.8*  HGB 13.8 14.3 14.7  HCT 40.3 42.0 42.3  MCV 98.8  --  96.4  PLT 300  --  160   Basic Metabolic Panel: Recent Labs  Lab 07/01/22 1330 07/01/22 1342 07/02/22 1050  NA 134* 135 133*  K 4.1 4.0 4.0  CL 104 102 102  CO2 24  --  23  GLUCOSE 176* 178* 170*  BUN 28* 30* 23  CREATININE 0.75 0.70 0.60  CALCIUM 8.3*  --  8.9  MG  --   --  2.3   GFR: Estimated Creatinine Clearance: 50.9 mL/min (by C-G formula based on SCr of 0.6 mg/dL). Liver Function Tests: Recent Labs  Lab 07/01/22 1330  AST 20  ALT 24  ALKPHOS 26*  BILITOT 1.2  PROT 5.9*  ALBUMIN 3.3*   No results for input(s): "LIPASE", "AMYLASE" in the last 168 hours. No  results for input(s): "AMMONIA" in the last 168 hours. Coagulation Profile: Recent Labs  Lab 07/01/22 1330  INR 1.1   Cardiac Enzymes: No results for input(s): "CKTOTAL", "CKMB", "CKMBINDEX", "TROPONINI" in the last 168 hours. BNP (last 3 results) No results for input(s): "PROBNP" in the last 8760 hours. HbA1C: Recent Labs    07/01/22 1330  HGBA1C 5.5   CBG: Recent Labs  Lab 07/01/22 1330 07/01/22 1630 07/02/22 0102 07/02/22 0226 07/02/22 1216  GLUCAP 158* 183* 175* 181* 184*   Lipid Profile: No results for input(s): "CHOL", "HDL", "LDLCALC", "TRIG", "CHOLHDL", "LDLDIRECT" in the last 72 hours. Thyroid Function Tests: No results for input(s): "TSH", "T4TOTAL", "FREET4", "T3FREE", "THYROIDAB" in the last 72 hours. Anemia Panel: No results for input(s): "VITAMINB12", "FOLATE", "FERRITIN", "TIBC", "IRON", "RETICCTPCT" in the last 72 hours. Urine analysis:    Component Value Date/Time   COLORURINE YELLOW 07/02/2022 0329   APPEARANCEUR HAZY (A) 07/02/2022 0329   LABSPEC 1.043 (H) 07/02/2022 0329   PHURINE 6.0 07/02/2022 0329   GLUCOSEU NEGATIVE 07/02/2022 0329   HGBUR LARGE (A) 07/02/2022 0329   BILIRUBINUR NEGATIVE 07/02/2022 0329   KETONESUR NEGATIVE 07/02/2022 0329   PROTEINUR 100 (A) 07/02/2022 0329   NITRITE NEGATIVE 07/02/2022 0329   LEUKOCYTESUR MODERATE (A) 07/02/2022 0329   Sepsis Labs: '@LABRCNTIP'$ (procalcitonin:4,lacticidven:4)  )No results found for this or any previous visit (from the past 240 hour(s)).    Studies: Overnight EEG with video  Result Date: 07/02/2022 Lora Havens, MD     07/02/2022  8:52 AM Patient Name: Miranda Martinez MRN: [id number] Epilepsy Attending: Lora Havens Referring Physician/Provider: Judith Part, MD Duration: 07/01/2022 1755 to 07/02/2022 0850 Patient history: 76 y.o. 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  EEG to evaluate for seizure. Level of alertness:  lethargic AEDs during EEG study: LEV Technical aspects: This EEG study was done with scalp electrodes positioned according to the 10-20 International system of electrode placement. Electrical activity was reviewed with band pass filter of 1-'70Hz'$ , sensitivity of 7 uV/mm, display speed of 53m/sec with a '60Hz'$  notched filter applied as appropriate. EEG data were recorded continuously and digitally stored.  Video monitoring was available and reviewed as appropriate. Description: The posterior dominant rhythm consists of 8 Hz activity of moderate voltage (25-35 uV) seen predominantly in posterior head regions, asymmetric and reactive to eye opening and eye closing.  EEG also showed continuous 3 to 5 Hz theta-delta slowing in left hemisphere as well as intermittent 3 to 5 Hz theta [place] slowing in right hemisphere admixed with 15 to 18 Hz generalized beta activity. At the beginning of the study, lateralized periodic discharges at 0.25 to '1hz'$  were noted in left hemisphere. Gradually, the periodic discharges appeared more frequent and at times appeared rhythmic lasting 3 to 5 seconds consistent with brief ictal-interictal rhythmic discharges. After around 1 AM on 07/02/2022 , seizures without clinical signs were noted in left hemisphere. Gradually, the frequency of seizures increased  and occupied more than 20% of the recording, therefore consistent with electrographic status epilepticus. Hyperventilation and photic stimulation were not performed.   ABNORMALITY - Electrographic status epilepticus, left hemisphere - Continuous slow, left hemisphere - Intermittent slow, right hemisphere IMPRESSION: This study was initially suggestive of cortical dysfunction and a blood density in left hemisphere with increased risk of seizures.  After around 1 AM on 07/02/2022, seizures without clinical signs were noted arising from left hemisphere. Gradually, the frequency of seizures  increased and occupied more than 20% of the recording and therefore consistent with electrographic status epilepticus. Additionally there is mild to moderate diffuse encephalopathy, likely related to seizures. Dr Leonel Ramsay was notified. Priyanka Barbra Sarks    Scheduled Meds:  dexamethasone (DECADRON) injection  10 mg Intravenous Q6H   insulin aspart  0-9 Units Subcutaneous TID WC   LORazepam  1 mg Intravenous Once   phenytoin (DILANTIN) IV  100 mg Intravenous Q8H    Continuous Infusions:  levETIRAcetam       LOS: 1 day     Alma Friendly, MD [facility]  If 7PM-7AM, please contact night-coverage [email or web address] 07/02/2022, 3:06 PM

## 2022-07-02 NOTE — Progress Notes (Signed)
Daily Progress Note   Patient Name: Miranda Martinez       Date: 07/02/2022 DOB: Nov 03, 1945  Age: 76 y.o. MRN#: [id number] Attending Physician: Alma Friendly, MD Primary Care Physician: [name], DO Admit Date: 07/01/2022  Reason for Consultation/Follow-up: Establishing goals of care  Subjective: Medical records reviewed. Patient assessed at the bedside.  She remains lethargic, difficult to understand few words she is able to say but more alert than yesterday.  Discussed with RN.  Her son and grandson are present at the bedside visiting.  Created space and opportunity for family's thoughts and feelings on patient's current illness.  Reviewed events overnight including increased frequency of worsening seizures, sharing my concern for long-term brain injury and that this is a poor prognostic indicator.  Advocated for patient family's request to hear from neurology and Dr. Cecil Cobbs input was appreciated when he arrived.  Provided patient's family with resources that describe the differences between palliative and hospice care, the differences between different facilities depending on patient's new care needs at baseline.  We discussed that if patient continues to do poorly, she may need hospice sooner than anticipated.  Her son notes that her home health agency was [facility] prior to discharge from therapy last week.  He is also in contact with [facility] to arrange for caregiver support.  We discussed the importance of planning for the worst while hoping for the best.  I explored whether patient's son would be comfortable making decision on CODE STATUS given the high risk for complications from ongoing seizures.  He feels a DNR would be in patient's best interest at this time and I  shared my support of this.  Questions and concerns addressed. PMT will continue to support holistically.   Length of Stay: 1  Physical Exam Vitals and nursing note reviewed.  Constitutional:      General: She is not in acute distress.    Interventions: Nasal cannula in place.     Comments: 2L   Cardiovascular:     Rate and Rhythm: Normal rate.  Pulmonary:     Effort: Pulmonary effort is normal.  Neurological:     Mental Status: She is lethargic, disoriented and confused.  Psychiatric:        Speech: Speech is delayed and slurred.  Cognition and Memory: Cognition is impaired.            Vital Signs: BP 117/84   Pulse 71   Temp (!) 96.8 F (36 C) (Axillary)   Resp 16   SpO2 99%  SpO2: SpO2: 99 % O2 Device: O2 Device: Nasal Cannula O2 Flow Rate: O2 Flow Rate (L/min): 2 L/min      Palliative Assessment/Data: 20%   Palliative Care Assessment & Plan   Patient Profile: 76 y.o. female  with past medical history of recent diagnosis of high-grade glioma admitted on 07/01/2022 with stroke-like symptoms.    PMT has been consulted to assist with goals of care conversation.  Assessment: Goals of care conversation Recurrent seizures High-grade glioma  Recommendations/Plan: CODE STATUS changed to DNR Continue full scope treatment Ongoing goals of care discussions pending clinical course Spiritual care consulted yesterday for assistance with advanced directives if/when patient is able to participate Patient's son understands severity of her illness and would transition to comfort care/hospice if she does not improve with meaningful quality of life PMT will continue to follow and support   Prognosis: Poor prognosis given high-grade glioma with acute worsening of symptoms  Discharge Planning: To Be Determined  Care plan was discussed with patient, patient's son, patient's grandson, Dr. Leonel Ramsay   MDM high         [place], PA-C  Palliative  Medicine Team Team phone # [phone]  Thank you for allowing the Palliative Medicine Team to assist in the care of this patient. Please utilize secure chat with additional questions, if there is no response within 30 minutes please call the above phone number.  Palliative Medicine Team providers are available by phone from 7am to 7pm daily and can be reached through the team cell phone.  Should this patient require assistance outside of these hours, please call the patient's attending physician.

## 2022-07-02 NOTE — ED Notes (Signed)
Pt opened both eyes for finger stick. Still unable to awaken to answer questions.

## 2022-07-02 NOTE — Progress Notes (Signed)
Continues to have significant left sided activity, will add vim pat, '200mg'$  q12h. Given preserved consciousness, would prefer to avoid intubation/burst suppression, especially in the setting of dnr/dni status. Will continue to try to treat with anti-epileptics for now.   Roland Rack, MD [facility] [phone]  If 7pm- 7am, please page neurology on call as listed in [place].

## 2022-07-03 DIAGNOSIS — Z515 Encounter for palliative care: Secondary | ICD-10-CM

## 2022-07-03 DIAGNOSIS — Z7189 Other specified counseling: Secondary | ICD-10-CM

## 2022-07-03 DIAGNOSIS — G40901 Epilepsy, unspecified, not intractable, with status epilepticus: Secondary | ICD-10-CM | POA: Diagnosis not present

## 2022-07-03 DIAGNOSIS — C719 Malignant neoplasm of brain, unspecified: Secondary | ICD-10-CM | POA: Diagnosis not present

## 2022-07-03 DIAGNOSIS — R569 Unspecified convulsions: Secondary | ICD-10-CM | POA: Diagnosis not present

## 2022-07-03 DIAGNOSIS — Z66 Do not resuscitate: Secondary | ICD-10-CM

## 2022-07-03 LAB — BASIC METABOLIC PANEL
Anion gap: 16 — ABNORMAL HIGH (ref 5–15)
BUN: 27 mg/dL — ABNORMAL HIGH (ref 8–23)
CO2: 18 mmol/L — ABNORMAL LOW (ref 22–32)
Calcium: 9.5 mg/dL (ref 8.9–10.3)
Chloride: 103 mmol/L (ref 98–111)
Creatinine, Ser: 0.82 mg/dL (ref 0.44–1.00)
GFR, Estimated: >60 mL/min (ref >60)
Glucose, Bld: 170 mg/dL — ABNORMAL HIGH (ref 70–99)
Potassium: 5 mmol/L (ref 3.5–5.1)
Sodium: 137 mmol/L (ref 135–145)

## 2022-07-03 LAB — CBC WITH DIFFERENTIAL/PLATELET
Abs Immature Granulocytes: 0.19 10*3/uL — ABNORMAL HIGH (ref 0.00–0.07)
Basophils Absolute: 0 10*3/uL (ref 0.0–0.1)
Basophils Relative: 0 %
Eosinophils Absolute: 0 10*3/uL (ref 0.0–0.5)
Eosinophils Relative: 0 %
HCT: 44.4 % (ref 36.0–46.0)
Hemoglobin: 15.3 g/dL — ABNORMAL HIGH (ref 12.0–15.0)
Immature Granulocytes: 1 %
Lymphocytes Relative: 5 %
Lymphs Abs: 0.8 10*3/uL (ref 0.7–4.0)
MCH: 33.7 pg (ref 26.0–34.0)
MCHC: 34.5 g/dL (ref 30.0–36.0)
MCV: 97.8 fL (ref 80.0–100.0)
Monocytes Absolute: 1.1 10*3/uL — ABNORMAL HIGH (ref 0.1–1.0)
Monocytes Relative: 6 %
Neutro Abs: 15.5 10*3/uL — ABNORMAL HIGH (ref 1.7–7.7)
Neutrophils Relative %: 88 %
Platelets: 204 10*3/uL (ref 150–400)
RBC: 4.54 MIL/uL (ref 3.87–5.11)
RDW: 13.5 % (ref 11.5–15.5)
WBC: 17.7 10*3/uL — ABNORMAL HIGH (ref 4.0–10.5)
nRBC: 0 % (ref 0.0–0.2)

## 2022-07-03 LAB — PHENYTOIN LEVEL, TOTAL: Phenytoin Lvl: 17.1 ug/mL (ref 10.0–20.0)

## 2022-07-03 LAB — CBG MONITORING, ED
Glucose-Capillary: 166 mg/dL — ABNORMAL HIGH (ref 70–99)
Glucose-Capillary: 167 mg/dL — ABNORMAL HIGH (ref 70–99)

## 2022-07-03 NOTE — ED Notes (Signed)
Patient incontinent of stool and urine.  Cleaned and changed.  New brief and pads placed.  Pure Wick placed to skin prevent skin breakdown

## 2022-07-03 NOTE — ED Notes (Signed)
Call to 3W 

## 2022-07-03 NOTE — Progress Notes (Signed)
Patient unable to void, bladder scan showed 410 ml in bladder.  In and out performed using sterile technique 650 ml of yellow urine returned patient tolerated  well.

## 2022-07-03 NOTE — Progress Notes (Signed)
PT Cancellation Note  Patient Details Name: Miranda Martinez MRN: [id number] DOB: 1945/09/23   Cancelled Treatment:    Reason Eval/Treat Not Completed: Medical issues which prohibited therapy.  Pt is presently unresponsive to move, reassess at another time.  EEG is on but have received permission to move if the pt is awake to attempt.   Ramond Dial 07/03/2022, 1:05 PM  Mee Hives, PT PhD Acute Rehab Dept. Number: [place] [facility] [place]

## 2022-07-03 NOTE — Progress Notes (Signed)
SLP Cancellation Note  Patient Details Name: Miranda Martinez MRN: [id number] DOB: Mar 13, 1946   Cancelled treatment:        Spoke with pt's husband at bedside who gave history of recent language assessments and pt's performance. He reported she is presently on "lots of sedating meds for her seizures." SLP will continue efforts for initiation of speech-language-cognitive assessment as warranted.   Houston Siren 07/03/2022, 12:20 PM

## 2022-07-03 NOTE — Progress Notes (Signed)
vLTM maintenance  All impedances below 10k.  No skin breakdown noted at all skin sites 

## 2022-07-03 NOTE — Progress Notes (Signed)
OT Cancellation Note  Patient Details Name: Milee Qualls MRN: [id number] DOB: 01/01/1946   Cancelled Treatment:    Reason Eval/Treat Not Completed: Medical issues which prohibited therapy.  Continue efforts as appropriate.  Labrenda Lasky D Bion Todorov 07/03/2022, 1:22 PM 07/03/2022  RP, OTR/L  Acute Rehabilitation Services  Office:  [phone]

## 2022-07-03 NOTE — ED Notes (Signed)
EEG ongoing, family is attentive at the bedside

## 2022-07-03 NOTE — Procedures (Signed)
Patient Name: Shamieka Gullo  MRN: [id number]  Epilepsy Attending: Lora Havens  Referring Physician/Provider: Judith Part, MD  Duration: 07/02/2022 1755 to 07/03/2022 1755   Patient history: 76 y.o. 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 EEG to evaluate for seizure.    Level of alertness:  lethargic    AEDs during EEG study: LEV, PHT   Technical aspects: This EEG study was done with scalp electrodes positioned according to the 10-20 International system of electrode placement. Electrical activity was reviewed with band pass filter of 1-'70Hz'$ , sensitivity of 7 uV/mm, display speed of 63m/sec with a '60Hz'$  notched filter applied as appropriate. EEG data were recorded continuously and digitally stored.  Video monitoring was available and reviewed as appropriate.   Description: EEG showed continuous generalized and lateralized left hemisphere 3 to 6 Hz theta-delta slowing admixed with 12 to 15 Hz generalized beta activity. Sharp waves were noted in left hemisphere, maximal left frontal region which appeared quasiperiodic at 0.5 to 1 Hz. Hyperventilation and photic stimulation were not performed.    ABNORMALITY - Lateralized periodic discharges, left hemisphere - Continuous slow, generalized and lateralized left hemisphere  IMPRESSION: This study was suggestive of epileptogenicity and cortical dysfunction in left hemisphere, maximal left frontal region secondary to underlying structural abnormality and with increased risk of seizure recurrence. Additionally there is moderate diffuse encephalopathy, nonspecific etiology.  Claire Bridge OBarbra Sarks

## 2022-07-03 NOTE — ED Notes (Signed)
Pt is back from LP 

## 2022-07-03 NOTE — Progress Notes (Addendum)
PROGRESS NOTE   Miranda Martinez  OTL:[id number]    DOB: 1946/05/14    DOA: 07/01/2022  PCP: [name], DO   I have briefly reviewed patients previous medical records in [facility].  Chief Complaint  Patient presents with   Code Stroke    Brief Narrative:  76 year old female with medical history significant for recent diagnosis of high-grade glioma, supposed to follow-up with neuro-oncology Dr. [name] on 10/19, presented to the ED on 07/01/2022 with strokelike symptoms/seizure-like symptoms.  She is staying with her son and had been functioning independently, until morning PTA when she was noted to be "a little fussier" with "word salad" and generalized weakness.  In the ED, word salad, slumping >aphasia, right facial droop, tremor of right eye.  Stroke code was called.  CT head showed stable findings of high-grade glioma and some blood related to recent procedure/biopsy.  Remains in ED due to lack of beds.  Admitted for possible partial status epilepticus due to high-grade glioma.  Neurology and neurosurgery/palliative care medicine consulted.  LTM EEG being done.   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  Neurology consultation appreciated.  Currently on Decadron 10 mg every 6 hours IV, s/p IV Ativan x1 on 10/17, IV Dilantin 100 mg every 8 hours, s/p IV fosphenytoin 1160 Mg x1 dose 10/17, IV Vimpat 200 mg every 12 hours and IV Keppra 1500 mg every 12 hours.  Discussed with Dr. Leonel Ramsay: Epilepsy team will follow-up later today and they will determine timing of repeat head CT.  Some purposeful movements of all extremities but no eye-opening or verbal response.  Remains n.p.o. and on IV fluids.  Neurosurgery seen 10/16: Had no specific recommendations other than steroids and AEDs.  Acute urinary  retention: Bladder scan showed >900 mL, in and out catheterization per protocol.  Emptied 1100 mL this morning.  Likely related to AMS.  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  Further evaluation pending.  Was supposed to see Dr. [name], neurooncology on 07/11/2022.  Repeat MRI brain pending.  He has been reportedly made aware of patient's admission.  I informed Dr. [name] of patient's admission.  She reportedly was a no-show for 2 office visits and he plans to see her postdischarge.  Leukocytosis: Likely related to steroids and stress response.  Afebrile.  Blood cultures x2 and urine culture pending.  Although has many bacteria on urine microscopy, no significant leukocytes, suspect asymptomatic bacteriuria.  Hyperglycemia: Secondary to steroids.  A1c 5.5.  Could consider monitoring CBGs and SSI.  [place] discussions: Palliative care input 10/17 appreciated.  Patient now DNR but continue full scope of treatment.  Son willing to transition to comfort care/hospice if she does not improve with meaningful quality of life.  There is no height or weight on file to calculate BMI.   DVT prophylaxis: SCD's Start: 07/01/22 1525     Code Status: DNR:  Family Communication: Discussed in detail with patient's son via phone, updated care and answered all questions.  We both agreed to hold off on NG tube placement for feeding purposes for additional 24 hours and reassess.  Dr. [name] had reached out to me regarding concern for no oral intake since admission. Disposition:  Status is: Inpatient Remains inpatient appropriate because: Status epilepticus     Consultants:   Neurology Neurosurgery Palliative care medicine  Procedures:  Antimicrobials:   TM EEG   Subjective:  Unresponsive. Unable to get history from her.  Per RN, remains unresponsive w/o motor seizure like activity.  Objective:   Vitals:   07/03/22 0600 07/03/22 0606 07/03/22 0700 07/03/22 0800  BP: (!) 155/75  (!)  146/78 (!) 151/81  Pulse: 65  68 76  Resp: '19  12 20  '$ Temp:  98.2 F (36.8 C)    TempSrc:  Axillary    SpO2: 99%  98% 98%    General exam: Elderly female, moderately built and poorly nourished lying supine in bed.  Somewhat fidgety at times. Respiratory system: Clear to auscultation. Respiratory effort normal. Cardiovascular system: S1 & S2 heard, RRR. No JVD, murmurs, rubs, gallops or clicks. No pedal edema.  Telemetry personally reviewed: Sinus rhythm. Gastrointestinal system: Abdomen is nondistended, soft and nontender.  Suprapubic bladder mass/distention appreciated and somewhat uncomfortable on palpation. Normal bowel sounds heard. Central nervous system: Obtunded.  No eye or verbal response.  Noted spontaneous movements of all extremities but more so of left side.  Some movement of right lower extremity but no movement of right upper extremity. No focal neurological deficits.  Purposeful movements of left upper extremity.  Does not follow any commands Extremities: As noted above Skin: No rashes, lesions or ulcers Psychiatry: Judgement and insight impaired.  Mood & affect cannot be assessed.    Data Reviewed:   I have personally reviewed following labs and imaging studies   CBC: Recent Labs  Lab 07/01/22 1330 07/01/22 1342 07/02/22 1050  WBC 14.6*  --  22.0*  NEUTROABS 9.1*  --  19.8*  HGB 13.8 14.3 14.7  HCT 40.3 42.0 42.3  MCV 98.8  --  96.4  PLT 300  --  240    Basic Metabolic Panel: Recent Labs  Lab 07/01/22 1330 07/01/22 1342 07/02/22 1050 07/03/22 0357  NA 134* 135 133* 137  K 4.1 4.0 4.0 5.0  CL 104 102 102 103  CO2 24  --  23 18*  GLUCOSE 176* 178* 170* 170*  BUN 28* 30* 23 27*  CREATININE 0.75 0.70 0.60 0.82  CALCIUM 8.3*  --  8.9 9.5  MG  --   --  2.3  --     Liver Function Tests: Recent Labs  Lab 07/01/22 1330  AST 20  ALT 24  ALKPHOS 26*  BILITOT 1.2  PROT 5.9*  ALBUMIN 3.3*    CBG: Recent Labs  Lab 07/02/22 1216 07/02/22 1655  07/03/22 0745  GLUCAP 184* 157* 166*    Microbiology Studies:  No results found for this or any previous visit (from the past 240 hour(s)).  Radiology Studies:  Overnight EEG with video  Result Date: 07/02/2022 Lora Havens, MD     07/02/2022  8:52 AM Patient Name: Blenda Wisecup MRN: [id number] Epilepsy Attending: Lora Havens Referring Physician/Provider: Judith Part, MD Duration: 07/01/2022 1755 to 07/02/2022 0850 Patient history: 76 y.o. 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 EEG to evaluate for seizure. Level of alertness:  lethargic AEDs during EEG study: LEV Technical aspects: This EEG study was done with scalp electrodes positioned according to the 10-20 International system of electrode placement. Electrical activity was reviewed with band pass filter of 1-'70Hz'$ , sensitivity of 7 uV/mm, display speed of 43m/sec with a '60Hz'$  notched filter applied as appropriate. EEG data were recorded continuously and digitally stored.  Video monitoring was  available and reviewed as appropriate. Description: The posterior dominant rhythm consists of 8 Hz activity of moderate voltage (25-35 uV) seen predominantly in posterior head regions, asymmetric and reactive to eye opening and eye closing.  EEG also showed continuous 3 to 5 Hz theta-delta slowing in left hemisphere as well as intermittent 3 to 5 Hz theta [place] slowing in right hemisphere admixed with 15 to 18 Hz generalized beta activity. At the beginning of the study, lateralized periodic discharges at 0.25 to '1hz'$  were noted in left hemisphere. Gradually, the periodic discharges appeared more frequent and at times appeared rhythmic lasting 3 to 5 seconds consistent with brief ictal-interictal rhythmic discharges. After around 1 AM on 07/02/2022 , seizures without clinical signs were noted in left hemisphere. Gradually, the frequency of  seizures increased and occupied more than 20% of the recording, therefore consistent with electrographic status epilepticus. Hyperventilation and photic stimulation were not performed.   ABNORMALITY - Electrographic status epilepticus, left hemisphere - Continuous slow, left hemisphere - Intermittent slow, right hemisphere IMPRESSION: This study was initially suggestive of cortical dysfunction and a blood density in left hemisphere with increased risk of seizures.  After around 1 AM on 07/02/2022, seizures without clinical signs were noted arising from left hemisphere. Gradually, the frequency of seizures increased and occupied more than 20% of the recording and therefore consistent with electrographic status epilepticus. Additionally there is mild to moderate diffuse encephalopathy, likely related to seizures. Dr Leonel Ramsay was notified. Lora Havens   CT VENOGRAM HEAD  Result Date: 07/01/2022 CLINICAL DATA:  Follow-up left-sided biopsy for tumor. EXAM: CT VENOGRAM HEAD TECHNIQUE: Venographic phase images of the brain were obtained following the administration of intravenous contrast. Multiplanar reformats and maximum intensity projections were generated. RADIATION DOSE REDUCTION: This exam was performed according to the departmental dose-optimization program which includes automated exposure control, adjustment of the mA and/or kV according to patient size and/or use of iterative reconstruction technique. CONTRAST:  62m OMNIPAQUE IOHEXOL 350 MG/ML SOLN COMPARISON:  Arterial study same day. Multiple other recent imaging studies beginning 06/16/2022. FINDINGS: Superior sagittal sinus is widely patent. Deep venous system is widely patent. Transverse sinuses, sigmoid sinuses and jugular veins show flow. Normal appearing cavernous sinuses. As noted on the arterial study, there is a prominent vein on the left draining superficial from near the region of the biopsy. I would favor that this is a prominent  superficial draining vein possibly due to hyperemia in the region. The unusual possibility of a post biopsy arteriovenous fistula is considered, but that would seem unlikely. IMPRESSION: 1. No evidence of dural venous sinus thrombosis. 2. Prominent superficial draining vein on the left near the region of the biopsy. I would favor that this is a prominent draining vein possibly due to hyperemia in the region. The unusual possibility of a post biopsy arteriovenous fistula is considered, but that would seem unlikely. Electronically Signed   By: MNelson ChimesM.D.   On: 07/01/2022 14:20   CT ANGIO HEAD NECK W WO CM (CODE STROKE)  Result Date: 07/01/2022 CLINICAL DATA:  Neuro deficit, acute, stroke suspected EXAM: CT ANGIOGRAPHY HEAD AND NECK TECHNIQUE: Multidetector CT imaging of the head and neck was performed using the standard protocol during bolus administration of intravenous contrast. Multiplanar CT image reconstructions and MIPs were obtained to evaluate the vascular anatomy. Carotid stenosis measurements (when applicable) are obtained utilizing NASCET criteria, using the distal internal carotid diameter as the denominator. RADIATION DOSE REDUCTION: This exam was performed according to the departmental  dose-optimization program which includes automated exposure control, adjustment of the mA and/or kV according to patient size and/or use of iterative reconstruction technique. CONTRAST:  38m OMNIPAQUE IOHEXOL 350 MG/ML SOLN COMPARISON:  Multiple recent imaging studies including head CT earlier same day. FINDINGS: CTA NECK FINDINGS Aortic arch: Aortic atherosclerosis, mild. Branching pattern is normal. Right carotid system: Common carotid artery widely patent to the bifurcation. Minimal plaque at the ICA bulb but no stenosis. Cervical ICA widely patent. Left carotid system: Common carotid artery widely patent to the bifurcation. Normal appearance of the bifurcation and ICA bulb. Cervical ICA is normal.  Vertebral arteries: Both vertebral artery origins are widely patent. Both vertebral arteries are normal through the cervical region to the foramen magnum. Skeleton: Normal Other neck: No mass or lymphadenopathy. Upper chest: Lung apices are clear. Review of the MIP images confirms the above findings CTA HEAD FINDINGS Anterior circulation: Both internal carotid arteries are patent through the skull base and siphon regions. No siphon stenosis. The anterior and middle cerebral vessels are patent. No large vessel occlusion or proximal stenosis. No aneurysm. There is a prominent vein extending superficial from the region of the operative approach. This could represent a normal vein that is well seen due to regional hyperemia, but the possibility of a small postoperative arteriovenous fistula is entertained. That would seem unlikely. Posterior circulation: Both vertebral arteries widely patent to the basilar. No basilar stenosis. Posterior circulation branch vessels are normal. Venous sinuses: Patent and normal. Anatomic variants: None significant. Review of the MIP images confirms the above findings IMPRESSION: 1. No large vessel occlusion or proximal stenosis. No aneurysm. 2. Prominent vein extending superficial from the region of the operative approach. This could represent a normal vein that is prominent due to regional hyperemia, but the possibility of a small postoperative arteriovenous fistula is entertained. That would seem unlikely. 3. Aortic atherosclerosis. Aortic Atherosclerosis (ICD10-I70.0). Electronically Signed   By: MNelson ChimesM.D.   On: 07/01/2022 14:17   CT HEAD CODE STROKE WO CONTRAST  Result Date: 07/01/2022 CLINICAL DATA:  Code stroke. EXAM: CT HEAD WITHOUT CONTRAST TECHNIQUE: Contiguous axial images were obtained from the base of the skull through the vertex without intravenous contrast. RADIATION DOSE REDUCTION: This exam was performed according to the departmental dose-optimization program  which includes automated exposure control, adjustment of the mA and/or kV according to patient size and/or use of iterative reconstruction technique. COMPARISON:  CT head June 18, 2022. FINDINGS: Brain: No clear evidence of acute large vascular territory infarct. Small volume of hyperdense hemorrhage coursing linearly subjacent to the left-sided burr hole, probably a combination of intraparenchymal and extra-axial. Known infiltrating tumor better characterized on prior MRI. Similar mass effect with approximately 7 mm of rightward midline shift. No evidence of hydrocephalus. Vascular: No hyperdense vessel identified. Skull: No acute fracture.  Left burr hole. Sinuses/Orbits: No acute findings. Other: No mastoid effusions. IMPRESSION: 1. Small volume of hyperdense hemorrhage coursing linearly subjacent to the left-sided burr hole (probably a combination of intraparenchymal and extra-axial) and likely related to recent biopsy. Recommend short interval follow-up CT head to ensure stability. 2. No clear evidence of acute large vascular territory infarct 3. Known infiltrating tumor better characterized on prior MRI. Similar mass effect with approximately 7 mm of rightward midline shift. Findings discussed with Dr. KLeonel Ramsayvia telephone at 1:52 PM Electronically Signed   By: FMargaretha SheffieldM.D.   On: 07/01/2022 13:56    Scheduled Meds:    dexamethasone (DECADRON) injection  10 mg  Intravenous Q6H   insulin aspart  0-9 Units Subcutaneous TID WC   LORazepam  1 mg Intravenous Once   phenytoin (DILANTIN) IV  100 mg Intravenous Q8H    Continuous Infusions:    sodium chloride     lacosamide (VIMPAT) IV Stopped (07/02/22 2245)   levETIRAcetam Stopped (07/03/22 0040)     LOS: 2 days     Vernell Leep, MD,  FACP, FHM, SFHM, [facility], [place]     To contact the attending provider between 7A-7P or the covering provider during after hours  7P-7A, please log into the web site [email or web address] and access using universal [place] password for that web site. If you do not have the password, please call the hospital operator.  07/03/2022, 8:46 AM

## 2022-07-03 NOTE — Progress Notes (Signed)
This chaplain responded to PMT consult for spiritual care and creating/updating the Pt. Advance Directive.  The Pt. son-Rob and grandson-Mike are at the bedside. The Pt. slept through the visit.   The chaplain began rapport building with Rob and provided education on the role of the chaplain on the PMT. The chaplain began AD education with Rob, noting the Pt. participation is needed in the space of creating an Advance Directive. Rob shares his hope Thursday will bring admission to the floor and the Pt. increased capacity for decision making. The chaplain agreed to work with the PMT in determining the next time for a spiritual care presence.   Chaplain Sallyanne Kuster [phone]

## 2022-07-03 NOTE — ED Notes (Signed)
Pt was transported to 3W, EEG was paused.  EEG folks have been notified to move device.  Will double check with them

## 2022-07-03 NOTE — Progress Notes (Signed)
Palliative Medicine Progress Note   Patient Name: Miranda Martinez       Date: 07/03/2022 DOB: 03/24/1946  Age: 76 y.o. MRN#: [id number] Attending Physician: Modena Jansky, MD Primary Care Physician: [name], DO Admit Date: 07/01/2022  Reason for Consultation/Follow-up: {Reason for Consult:23484}  HPI/Patient Profile: 76 y.o. female with recent diagnosis of high-grade glioma who presented to [facility] ED on 07/01/2022 with new onset seizures and eventually went into non-convulsive status epilepticus which has since resolved.    PMT has been consulted to assist with goals of care conversation.    Subjective: Chart reviewed and patient assessed at bedside. She remains in the ED, awaiting a bed. She remains nonverbal and minimally responsive. On continuous EEG. EEG with increased risk of seizure recurrence and moderate diffuse encephalopathy.   Patient's son and grandson are at bedside. Discussed that seizures improved since yesterday , but there is a high risk of recurrence.   Objective:  Physical Exam Vitals reviewed.  Constitutional:      General: She is not in acute distress. Pulmonary:     Effort: Pulmonary effort is normal.             Vital Signs: BP 139/79   Pulse 67   Temp 98.4 F (36.9 C)   Resp 16   SpO2 96%  SpO2: SpO2: 96 % O2 Device: O2 Device: Room Air O2 Flow Rate: O2 Flow Rate (L/min): 2 L/min      Palliative Assessment/Data: ***     Palliative Medicine Assessment & Plan   Assessment: Principal Problem:   Glioblastoma multiforme of brain ([facility]) Active Problems:   Stroke-like symptoms    Recommendations/Plan: Continue DNR/DNI as previously documented Continue full scope care Ongoing goals of care discussions pending clinical course Patient's son  understands severity of her illness and with transition to comfort care/hospice if there is not a chance for meaningful improvement  Goals of Care and Additional Recommendations: Limitations on Scope of Treatment: {Recommended Scope and Preferences:21019}   Prognosis:  {Palliative Care Prognosis:23504}  Discharge Planning: {Palliative dispostion:23505}  Care plan was discussed with ***  Thank you for allowing the Palliative Medicine Team to assist in the care of this patient.   ***   Lavena Bullion, NP   Please contact Palliative Medicine Team phone at [phone] for questions and concerns.  For individual providers, please see AMION.

## 2022-07-03 NOTE — ED Notes (Signed)
200cc urine in purewick container.  DIrect message to floor RN regarding pt urinary retention this am and need for bladder scan

## 2022-07-03 NOTE — Progress Notes (Signed)
Subjective: Per son and grandson at bedside, she continues to be minimally responsive and nonverbal since yesterday.  Son also states that patient usually is very healthy and does not take any medications.  Denies any prior history of seizures.  States she was in for her appointment and had to come to the emergency room because of worsening speech and worsening mental status  ROS: Unable to obtain due to poor mental status  Examination  Vital signs in last 24 hours: Temp:  [96.8 F (36 C)-98.5 F (36.9 C)] 98.4 F (36.9 C) (10/18 1023) Pulse Rate:  [58-76] 60 (10/18 1000) Resp:  [12-20] 18 (10/18 1000) BP: (117-159)/(64-84) 152/64 (10/18 1000) SpO2:  [96 %-100 %] 99 % (10/18 1000)  General: lying in bed, NAD Neuro: Difficult to arouse, barely opens eyes to repeated tactile stimulation, did not look at examiner, did not follow commands, did not answer any orientation questions, pupils appear reactive, does not have any forced gaze deviation, has flattening of right nasolabial fold, withdraws to noxious stimuli in all extremities with antigravity strength  Basic Metabolic Panel: Recent Labs  Lab 07/01/22 1330 07/01/22 1342 07/02/22 1050 07/03/22 0357  NA 134* 135 133* 137  K 4.1 4.0 4.0 5.0  CL 104 102 102 103  CO2 24  --  23 18*  GLUCOSE 176* 178* 170* 170*  BUN 28* 30* 23 27*  CREATININE 0.75 0.70 0.60 0.82  CALCIUM 8.3*  --  8.9 9.5  MG  --   --  2.3  --     CBC: Recent Labs  Lab 07/01/22 1330 07/01/22 1342 07/02/22 1050  WBC 14.6*  --  22.0*  NEUTROABS 9.1*  --  19.8*  HGB 13.8 14.3 14.7  HCT 40.3 42.0 42.3  MCV 98.8  --  96.4  PLT 300  --  260     Coagulation Studies: Recent Labs    07/01/22 1330  LABPROT 14.5  INR 1.1    Imaging CT head without contrast 07/01/2022: . Small volume of hyperdense hemorrhage coursing linearly subjacent to the left-sided burr hole (probably a combination of intraparenchymal and extra-axial) and likely related to recent  biopsy. Recommend short interval follow-up CT head to ensure stability. 2. No clear evidence of acute large vascular territory infarct 3. Known infiltrating tumor better characterized on prior MRI. Similar mass effect with approximately 7 mm of rightward midline shift.  CTA  head and neck with and without contrast 07/01/2020: 1. No large vessel occlusion or proximal stenosis. No aneurysm. Prominent vein extending superficial from the region of the operative approach. This could represent a normal vein that is prominent due to regional hyperemia, but the possibility of a small postoperative arteriovenous fistula is entertained. That would seem unlikely. Aortic atherosclerosis.  CTV head 07/01/2022: 1. No evidence of dural venous sinus thrombosis.  Prominent superficial draining vein on the left near the region of the biopsy. I would favor that this is a prominent draining vein possibly due to hyperemia in the region. The unusual possibility of a post biopsy arteriovenous fistula is considered, but that would seem unlikely.  ASSESSMENT AND PLAN: 76 year old female with recent diagnosis of high-grade neoplasm in left temporal region who presented with new onset seizures and eventually went into nonconvulsive status epilepticus which has since resolved.   New onset epilepsy High-grade left temporal neoplasm Acute encephalopathy, multifactorial Leukocytosis Hyponatremia -Seizures improved after phenytoin load yesterday but continues to have epileptiform discharges in left frontal region -Encephalopathy likely secondary to medications, postictal  state -Leukocytosis could be secondary to dexamethasone use -Hyponatremia could be secondary to lack of p.o. intake  Recommendations -We will continue current AEDs: Keppra 1500 mg twice daily, Vimpat 200 mg twice daily and Dilantin 100 mg every 8 hours. -If any further seizures, can consider adding perampanel '8mg'$  once if it is p.o. access.  -Continue  LTM EEG overnight to look for intermittent seizures -Will likely obtain MRI brain with and without contrast tomorrow -Discussed with Dr. Algis Liming about potentially putting an NG tube -Discussed plan with son and grandson at bedside in detail  I have spent a total of 55   minutes with the patient reviewing hospital notes,  test results, labs and examining the patient as well as establishing an assessment and plan.  > 50% of time was spent in direct patient care.   Zeb Comfort Epilepsy [facility] For questions after 5pm please refer to AMION to reach the Neurologist on call

## 2022-07-04 ENCOUNTER — Inpatient Hospital Stay (HOSPITAL_COMMUNITY): Payer: Medicare Other

## 2022-07-04 DIAGNOSIS — G40901 Epilepsy, unspecified, not intractable, with status epilepticus: Secondary | ICD-10-CM | POA: Diagnosis not present

## 2022-07-04 DIAGNOSIS — R569 Unspecified convulsions: Secondary | ICD-10-CM | POA: Diagnosis not present

## 2022-07-04 DIAGNOSIS — C719 Malignant neoplasm of brain, unspecified: Secondary | ICD-10-CM | POA: Diagnosis not present

## 2022-07-04 LAB — CBC WITH DIFFERENTIAL/PLATELET
Abs Immature Granulocytes: 0.16 10*3/uL — ABNORMAL HIGH (ref 0.00–0.07)
Basophils Absolute: 0 10*3/uL (ref 0.0–0.1)
Basophils Relative: 0 %
Eosinophils Absolute: 0 10*3/uL (ref 0.0–0.5)
Eosinophils Relative: 0 %
HCT: 38.6 % (ref 36.0–46.0)
Hemoglobin: 13.5 g/dL (ref 12.0–15.0)
Immature Granulocytes: 1 %
Lymphocytes Relative: 5 %
Lymphs Abs: 0.8 10*3/uL (ref 0.7–4.0)
MCH: 33.8 pg (ref 26.0–34.0)
MCHC: 35 g/dL (ref 30.0–36.0)
MCV: 96.5 fL (ref 80.0–100.0)
Monocytes Absolute: 1 10*3/uL (ref 0.1–1.0)
Monocytes Relative: 7 %
Neutro Abs: 12.8 10*3/uL — ABNORMAL HIGH (ref 1.7–7.7)
Neutrophils Relative %: 87 %
Platelets: 183 10*3/uL (ref 150–400)
RBC: 4 MIL/uL (ref 3.87–5.11)
RDW: 13.3 % (ref 11.5–15.5)
WBC: 14.7 10*3/uL — ABNORMAL HIGH (ref 4.0–10.5)
nRBC: 0 % (ref 0.0–0.2)

## 2022-07-04 LAB — GLUCOSE, CAPILLARY
Glucose-Capillary: 145 mg/dL — ABNORMAL HIGH (ref 70–99)
Glucose-Capillary: 151 mg/dL — ABNORMAL HIGH (ref 70–99)
Glucose-Capillary: 123 mg/dL — ABNORMAL HIGH (ref 70–99)
Glucose-Capillary: 131 mg/dL — ABNORMAL HIGH (ref 70–99)

## 2022-07-04 LAB — URINE CULTURE: Culture: >=100000 — AB

## 2022-07-04 LAB — BASIC METABOLIC PANEL
Anion gap: 8 (ref 5–15)
BUN: 20 mg/dL (ref 8–23)
CO2: 25 mmol/L (ref 22–32)
Calcium: 8.4 mg/dL — ABNORMAL LOW (ref 8.9–10.3)
Chloride: 104 mmol/L (ref 98–111)
Creatinine, Ser: 0.61 mg/dL (ref 0.44–1.00)
GFR, Estimated: >60 mL/min (ref >60)
Glucose, Bld: 171 mg/dL — ABNORMAL HIGH (ref 70–99)
Potassium: 3.9 mmol/L (ref 3.5–5.1)
Sodium: 137 mmol/L (ref 135–145)

## 2022-07-04 MED ORDER — ORAL CARE MOUTH RINSE: 15.0000 mL | OROMUCOSAL | Status: DC | PRN

## 2022-07-04 MED ORDER — CHLORHEXIDINE GLUCONATE CLOTH 2 % EX PADS
6.0000 | MEDICATED_PAD | Freq: Every day | CUTANEOUS | Status: DC
  Administered 2022-07-04 – 2022-07-16 (×14): 6 via TOPICAL

## 2022-07-04 MED ORDER — PIPERACILLIN-TAZOBACTAM 3.375 G IVPB
3.3750 g | Freq: Three times a day (TID) | INTRAVENOUS | Status: AC
  Administered 2022-07-04 – 2022-07-07 (×9): 3.375 g via INTRAVENOUS
  Filled 2022-07-04 (×9): qty 50

## 2022-07-04 MED ORDER — GADOBUTROL 1 MMOL/ML IV SOLN
5.0000 mL | Freq: Once | INTRAVENOUS | Status: AC | PRN
  Administered 2022-07-04: 5 mL via INTRAVENOUS

## 2022-07-04 NOTE — Care Management Important Message (Signed)
Important Message  Patient Details  Name: Miranda Martinez MRN: [id number] Date of Birth: November 03, 1945   Medicare Important Message Given:  Yes     Gailyn Crook Montine Circle 07/04/2022, 3:46 PM

## 2022-07-04 NOTE — Progress Notes (Signed)
This chaplain attempted F/U spiritual care visit with the Pt. and family. The chaplain understands the Pt. is out of the room for MRI and family may have left.  This chaplain will attempt a revisit.  Chaplain Sallyanne Kuster [phone]

## 2022-07-04 NOTE — Progress Notes (Signed)
LTM maint complete - no skin breakdown under: F7

## 2022-07-04 NOTE — Procedures (Addendum)
Patient Name: Miranda Martinez  MRN: [id number]  Epilepsy Attending: Lora Havens  Referring Physician/Provider: Judith Part, MD  Duration: 07/03/2022 1755 to 07/04/2022 1005, 2237 to 07/05/2022 0600   Patient history: 76 y.o. 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 EEG to evaluate for seizure.    Level of alertness:  lethargic    AEDs during EEG study: LEV, PHT, LCM   Technical aspects: This EEG study was done with scalp electrodes positioned according to the 10-20 International system of electrode placement. Electrical activity was reviewed with band pass filter of 1-'70Hz'$ , sensitivity of 7 uV/mm, display speed of 92m/sec with a '60Hz'$  notched filter applied as appropriate. EEG data were recorded continuously and digitally stored.  Video monitoring was available and reviewed as appropriate.   Description: EEG showed continuous generalized and lateralized left hemisphere 3 to 6 Hz theta-delta slowing admixed with 12 to 15 Hz generalized beta activity. Sharp waves were noted in left hemisphere, maximal left frontal region which appeared quasiperiodic at 0.5 to 1 Hz. Hyperventilation and photic stimulation were not performed.   Patient was disconnected between 07/04/2022 1005 to 2237  for MRI brain   ABNORMALITY - Lateralized periodic discharges, left hemisphere - Continuous slow, generalized and lateralized left hemisphere   IMPRESSION: This study was suggestive of epileptogenicity and cortical dysfunction in left hemisphere, maximal left frontal region secondary to underlying structural abnormality and with increased risk of seizure recurrence. Additionally there is moderate diffuse encephalopathy, nonspecific etiology.   Miranda Martinez

## 2022-07-04 NOTE — Progress Notes (Signed)
Checked in with RN to se if patient went to MRI. Still waiting on MRI

## 2022-07-04 NOTE — Progress Notes (Signed)
OT Cancellation Note  Patient Details Name: Miranda Martinez MRN: [id number] DOB: 06/15/1946   Cancelled Treatment:    Reason Eval/Treat Not Completed: Patient at procedure or test/ unavailable (Pt at MRI. Will return as schedule allows.)  Shanda Howells, OTR/L [facility] Acute Rehabilitation Office: [phone]   [name] 07/04/2022, 3:01 PM

## 2022-07-04 NOTE — Evaluation (Signed)
Clinical/Bedside Swallow Evaluation Patient Details  Name: Miranda Martinez MRN: [id number] Date of Birth: Apr 15, 1946  Today's Date: 07/04/2022 Time: SLP Start Time (ACUTE ONLY): 1028 SLP Stop Time (ACUTE ONLY): 1049 SLP Time Calculation (min) (ACUTE ONLY): 21 min  Past Medical History:  Past Medical History:  Diagnosis Date   Glioma of brain [facility])    Past Surgical History:  Past Surgical History:  Procedure Laterality Date   APPLICATION OF CRANIAL NAVIGATION Left 06/18/2022   Procedure: APPLICATION OF CRANIAL NAVIGATION;  Surgeon: Judith Part, MD;  Location: [place];  Service: Neurosurgery;  Laterality: Left;   PR DURAL GRAFT REPAIR,SPINE DEFECT Left 06/18/2022   Procedure: FRAMELESS STEREOTACTIC BIOPSY WITH STEALTH;  Surgeon: Judith Part, MD;  Location: [place];  Service: Neurosurgery;  Laterality: Left;   HPI:  76 y.o. 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    Assessment / Plan / Recommendation  Clinical Impression  Pt was seen for swallow eval, as her son, Miranda Martinez, shared that she had just woken up enough to request something. Her mentation remains a barrier to PO intake at the moment though, as she is not aware or attentive enough to POs to even accept them most of the time. She did get one ice chip into her mouth, but it promptly fell back out of her mouth, which was sitting in an open posture. Pt was initially quite resistent to oral care, but with help from Miranda Martinez, who says that pt is also very responsive to familiar routines, oral care was ultimately able to be performed (especially for dentition). Encourage continuing frequent oral care for now. SLP will continue to follow for an increase in alertness and attention which may allow for more PO intake. SLP Visit Diagnosis: Dysphagia, unspecified (R13.10)    Aspiration Risk  Risk for inadequate nutrition/hydration;Moderate aspiration risk;Severe  aspiration risk    Diet Recommendation NPO   Medication Administration: Via alternative means    Other  Recommendations Oral Care Recommendations: Oral care QID Other Recommendations: Have oral suction available    Recommendations for follow up therapy are one component of a multi-disciplinary discharge planning process, led by the attending physician.  Recommendations may be updated based on patient status, additional functional criteria and insurance authorization.  Follow up Recommendations  (tba)      Assistance Recommended at Discharge Frequent or constant Supervision/Assistance  Functional Status Assessment Patient has had a recent decline in their functional status and demonstrates the ability to make significant improvements in function in a reasonable and predictable amount of time.  Frequency and Duration min 2x/week  2 weeks       Prognosis Prognosis for Safe Diet Advancement: Fair Barriers to Reach Goals: Cognitive deficits;Language deficits;Other (Comment) (overall medical prognosis)      Swallow Study   General HPI: 76 y.o. 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 Type of Study: Bedside Swallow Evaluation Previous Swallow Assessment: none in chart Diet Prior to this Study: NPO Temperature Spikes Noted: No Respiratory Status: Room air History of Recent Intubation: No Behavior/Cognition: Lethargic/Drowsy;Doesn't follow directions Oral Cavity Assessment: Dry Oral Care Completed by SLP: Yes Oral Cavity - Dentition: Adequate natural dentition Self-Feeding Abilities: Total assist Patient Positioning: Upright in bed Baseline Vocal Quality: Low vocal intensity (sort of mumbled) Volitional Cough: Cognitively unable to elicit Volitional Swallow: Unable to elicit    Oral/Motor/Sensory Function  Overall Oral Motor/Sensory Function:  (not following commands to assess)   Ice Chips Ice  chips: Impaired Presentation: Spoon Oral Phase Impairments: Other (comment) (only accepted x1 and it fell back out of her mouth)   Thin Liquid Thin Liquid: Impaired Presentation:  (swab; offered via straw but she did not suck any into her mouth) Oral Phase Impairments: Poor awareness of bolus    Nectar Thick Nectar Thick Liquid: Not tested   Honey Thick Honey Thick Liquid: Not tested   Puree Puree: Not tested   Solid     Solid: Not tested      Osie Bond., M.A. [place] Office [phone]  Secure chat preferred  07/04/2022,3:26 PM

## 2022-07-04 NOTE — Progress Notes (Signed)
SLP Cancellation Note  Patient Details Name: Miranda Martinez MRN: [id number] DOB: 1946/06/30   Cancelled treatment:       Reason Eval/Treat Not Completed: Fatigue/lethargy limiting ability to participate (Pt's RN denied any significant improvement in pt's alertness today. It was agreed that RN will contact SLP if pt's alertness improves enough later for her to participate in a speech-language evaluation.)  Aahana Elza I. Hardin Negus, [place], [place] Office number [phone]  Horton Marshall 07/04/2022, 9:57 AM

## 2022-07-04 NOTE — Care Management Important Message (Signed)
Important Message  Patient Details  Name: Miranda Martinez MRN: [id number] Date of Birth: Nov 05, 1945   Medicare Important Message Given:  Yes  Due to illness patient could not sign.  Sign copy left with patient .   Larissa Pegg 07/04/2022, 1:46 PM

## 2022-07-04 NOTE — Progress Notes (Signed)
Plan was to I&O cath patient after 825m bladder scan, found skin tear near urethra, MD notified and foley placed instead. LDA updated.

## 2022-07-04 NOTE — Progress Notes (Signed)
PT Cancellation Note  Patient Details Name: Miranda Martinez MRN: [id number] DOB: 1946/06/13   Cancelled Treatment:    Reason Eval/Treat Not Completed: Medical issues which prohibited therapy. Discussed pt case with RN this morning who states pt continued to be lethargic and unable to follow commands. Checked back this afternoon and pt off unit for MRI and to have EEG placed upon return. RN states pt is more interactive this afternoon. I am hopeful we will be able to initiate PT eval tomorrow, however will continue to monitor for most appropriate time to attempt.    Thelma Comp 07/04/2022, 2:38 PM  Rolinda Roan, PT, DPT Acute Rehabilitation Services Secure Chat Preferred Office: [phone]

## 2022-07-04 NOTE — Progress Notes (Signed)
PROGRESS NOTE   Miranda Martinez  DZH:[id number]    DOB: 12-19-1945    DOA: 07/01/2022  PCP: [name], DO   I have briefly reviewed patients previous medical [facility] in [facility].  Chief Complaint  Patient presents with   Code Stroke    Brief Narrative:  76 year old female with medical history significant for recent diagnosis of high-grade glioma, supposed to follow-up with neuro-oncology Dr. [name] on 10/19, presented to the ED on 07/01/2022 with strokelike symptoms/seizure-like symptoms.  She is staying with her son and had been functioning independently, until morning PTA when she was noted to be "a little fussier" with "word salad" and generalized weakness.  In the ED, word salad, slumping >aphasia, right facial droop, tremor of right eye.  Stroke code was called.  CT head showed stable findings of high-grade glioma and some blood related to recent procedure/biopsy.  Remains in ED due to lack of beds.  Admitted for possible partial status epilepticus due to high-grade glioma.  Neurology and neurosurgery/palliative care medicine consulted.  LTM EEG completed 10/19.  Mental status improving.   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  Neurology consultation appreciated.  Currently on Decadron 10 mg every 6 hours IV, s/p IV Ativan x1 on 10/17, IV Dilantin 100 mg every 8 hours, s/p IV fosphenytoin 1160 Mg x1 dose 10/17, IV Vimpat 200 mg every 12 hours and IV Keppra 1500 mg every 12 hours.  Neurosurgery seen 10/16: Had no specific recommendations other than steroids and AEDs.  Mental status improving.  LTM EEG completed 10/19: Abnormal (lateralized periodic discharges, left hemisphere.  Continuous slow, generalized and lateralized left hemisphere.  Discussed with Dr. [name] and her input appreciated.  Continue current  AEDs: Keppra 1500 mg twice daily, Vimpat 200 mg twice daily and Dilantin 100 mg every 8 hours, IV until able to consistently take p.o.  Not safe yet to take p.o.  Has outpatient follow-up with Dr. [name], neurooncology on 10/26.    Acute encephalopathy: Secondary to status epilepticus, postictal phase and polypharmacy.  Mental status slowly improving.  Apparently did not pass swallow eval with SLP.  Continue gentle IV fluids for today and reassess in a.m.  Acute urinary retention: Bladder scan showed >900 mL, in and out catheterization per protocol.  Emptied 1100 mL this morning.  Likely related to AMS.  Failed to spontaneously void despite 2 attempts of in and out catheterization and now has a vaginal tear and hence Foley catheter placed to remain.  Consider starting Flomax once able to tolerate p.o.  Consider voiding trial in 48 hours.  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  Further evaluation pending.  Has follow-up to see Dr. [name], neurooncology on 07/11/2022.  Repeat MRI brain ordered by Dr. [name] and pending.    Leukocytosis: Likely related to steroids and stress response.  Afebrile.  Blood cultures x2: Negative to date and urine culture: E. coli, MDR.  Although has many bacteria on urine microscopy, no significant leukocytes, suspect asymptomatic bacteriuria.  However given her AMS, unable to verify symptoms with her.  Thereby will treat as below.  E. coli acute cystitis: IV Zosyn x3 days.  Hyperglycemia: Secondary to steroids.  A1c 5.5.  Could consider monitoring CBGs and SSI.  Union discussions: Palliative care input 10/17 appreciated.  Patient now DNR but continue full scope of treatment.  Son willing  to transition to comfort care/hospice if she does not improve with meaningful quality of life.  Body mass index is 21.89 kg/m.   DVT prophylaxis: SCD's Start: 07/01/22 1525     Code Status: DNR:  Family Communication: Son and grandson at bedside. Disposition:   Status is: Inpatient Remains inpatient appropriate because: Status epilepticus     Consultants:   Neurology Neurosurgery Palliative care medicine  Procedures:     Antimicrobials:   TM EEG   Subjective:  Patient was sleeping but easily arousable to call.  Still lethargic.  On asking her name, kept repeating "[place]" couple times even to other questions.  Not following even simple instructions.  Per son at bedside, earlier on said that she was "hungry".  Not consistently awake and alert.  Objective:   Vitals:   07/04/22 0313 07/04/22 0738 07/04/22 1134 07/04/22 1300  BP: (!) 144/77 130/69 (!) 149/85   Pulse: 65 62 61   Resp: '17 14 14   '$ Temp: (!) 97.4 F (36.3 C) 98.2 F (36.8 C) 98.2 F (36.8 C)   TempSrc: Axillary Axillary Oral   SpO2: 98% 97% 97%   Weight:    57.8 kg    General exam: Elderly female, moderately built and poorly nourished lying supine in bed.  Looks improved compared to yesterday.  Not fidgety. Respiratory system: Clear to auscultation.  No increased work of breathing. Cardiovascular system: S1 & S2 heard, RRR. No JVD, murmurs, rubs, gallops or clicks. No pedal edema.  Telemetry personally reviewed: Sinus rhythm. Gastrointestinal system: Abdomen is nondistended, soft and nontender.  Normal bowel sounds heard.  Again subumbilical bladder distention appreciated. Central nervous system: Mental status definitely better than yesterday, as documented above. Extremities: Moving all extremities spontaneously Skin: No rashes, lesions or ulcers Psychiatry: Judgement and insight impaired.  Mood & affect cannot be assessed.    Data Reviewed:   I have personally reviewed following labs and imaging studies   CBC: Recent Labs  Lab 07/02/22 1050 07/03/22 1550 07/04/22 0327  WBC 22.0* 17.7* 14.7*  NEUTROABS 19.8* 15.5* 12.8*  HGB 14.7 15.3* 13.5  HCT 42.3 44.4 38.6  MCV 96.4 97.8 96.5  PLT 260 204 782    Basic Metabolic Panel: Recent Labs  Lab  07/01/22 1330 07/01/22 1342 07/02/22 1050 07/03/22 0357 07/04/22 0327  NA 134* 135 133* 137 137  K 4.1 4.0 4.0 5.0 3.9  CL 104 102 102 103 104  CO2 24  --  23 18* 25  GLUCOSE 176* 178* 170* 170* 171*  BUN 28* 30* 23 27* 20  CREATININE 0.75 0.70 0.60 0.82 0.61  CALCIUM 8.3*  --  8.9 9.5 8.4*  MG  --   --  2.3  --   --     Liver Function Tests: Recent Labs  Lab 07/01/22 1330  AST 20  ALT 24  ALKPHOS 26*  BILITOT 1.2  PROT 5.9*  ALBUMIN 3.3*    CBG: Recent Labs  Lab 07/03/22 1349 07/04/22 0804 07/04/22 1133  GLUCAP 167* 145* 151*    Microbiology Studies:   Recent Results (from the past 240 hour(s))  Urine Culture     Status: Abnormal   Collection Time: 07/02/22  7:06 AM   Specimen: Urine, Clean Catch  Result Value Ref Range Status   Specimen Description URINE, CLEAN CATCH  Final   Special Requests   Final    NONE Performed at [facility], [place] [address] [place], [place] [address]    Culture (  A)  Final    >=100,000 COLONIES/mL ESCHERICHIA COLI Confirmed Extended Spectrum Beta-Lactamase Producer (ESBL).  In bloodstream infections from ESBL organisms, carbapenems are preferred over piperacillin/tazobactam. They are shown to have a lower risk of mortality.    Report Status 07/04/2022 FINAL  Final   Organism ID, Bacteria ESCHERICHIA COLI (A)  Final      Susceptibility   Escherichia coli - MIC*    AMPICILLIN >=32 RESISTANT Resistant     CEFAZOLIN >=64 RESISTANT Resistant     CEFEPIME 16 RESISTANT Resistant     CEFTRIAXONE >=64 RESISTANT Resistant     CIPROFLOXACIN 0.5 INTERMEDIATE Intermediate     GENTAMICIN >=16 RESISTANT Resistant     IMIPENEM <=0.25 SENSITIVE Sensitive     NITROFURANTOIN <=16 SENSITIVE Sensitive     TRIMETH/SULFA <=20 SENSITIVE Sensitive     AMPICILLIN/SULBACTAM >=32 RESISTANT Resistant     PIP/TAZO <=4 SENSITIVE Sensitive     * >=100,000 COLONIES/mL ESCHERICHIA COLI  Culture, blood (Routine X 2) w Reflex to ID Panel     Status:  None (Preliminary result)   Collection Time: 07/03/22  3:57 AM   Specimen: BLOOD LEFT WRIST  Result Value Ref Range Status   Specimen Description BLOOD LEFT WRIST  Final   Special Requests   Final    BOTTLES DRAWN AEROBIC ONLY Blood Culture results may not be optimal due to an inadequate volume of blood received in culture bottles   Culture   Final    NO GROWTH 1 DAY Performed at [facility] [address] [place], [place] [address]    Report Status PENDING  Incomplete  Culture, blood (Routine X 2) w Reflex to ID Panel     Status: None (Preliminary result)   Collection Time: 07/03/22  3:50 PM   Specimen: BLOOD  Result Value Ref Range Status   Specimen Description BLOOD BLOOD LEFT HAND  Final   Special Requests   Final    BOTTLES DRAWN AEROBIC AND ANAEROBIC Blood Culture results may not be optimal due to an inadequate volume of blood received in culture bottles   Culture   Final    NO GROWTH < 24 HOURS Performed at [facility], [place] [address] [place], [place] [address]    Report Status PENDING  Incomplete    Radiology Studies:  No results found.  Scheduled Meds:    Chlorhexidine Gluconate Cloth  6 each Topical Daily   dexamethasone (DECADRON) injection  10 mg Intravenous Q6H   insulin aspart  0-9 Units Subcutaneous TID WC   LORazepam  1 mg Intravenous Once   phenytoin (DILANTIN) IV  100 mg Intravenous Q8H    Continuous Infusions:    sodium chloride 75 mL/hr at 07/04/22 1039   lacosamide (VIMPAT) IV 200 mg (07/04/22 1043)   levETIRAcetam 1,500 mg (07/04/22 1156)     LOS: 3 days     Vernell Leep, MD,  FACP, FHM, SFHM, [facility], [place]     To contact the attending provider between 7A-7P or the covering provider during after hours 7P-7A, please log into the web site [email or web address] and access using universal [place] password for that web site. If you do not have the password, please call  the hospital operator.  07/04/2022, 2:39 PM

## 2022-07-04 NOTE — Progress Notes (Signed)
LTM EEG discontinued - no skin breakdown at [facility]. Will rehook after MRI

## 2022-07-04 NOTE — Progress Notes (Signed)
Subjective: Has been a little more awake.  Per son and grandson at bedside, did say she was hungry earlier but did not pass swallow eval yet.   ROS: Unable to obtain due to poor mental status  Examination  Vital signs in last 24 hours: Temp:  [97.4 F (36.3 C)-98.6 F (37 C)] 98.2 F (36.8 C) (10/19 1134) Pulse Rate:  [61-73] 61 (10/19 1134) Resp:  [14-25] 14 (10/19 1134) BP: (130-180)/(69-88) 149/85 (10/19 1134) SpO2:  [96 %-99 %] 97 % (10/19 1134)  General: lying in bed, NAD Neuro: Opens eyes to repeated tactile and verbal stimuli, did tell me her name but did not answer any other questions, did not follow any commands, PERRLA, no forced gaze deviation, has flattening of right nasolabial fold, spontaneously moving left upper extremity with antigravity strength, spontaneously moving bilateral lower extremities, did withdraw to noxious stimuli in right upper extremity but did not show antigravity strength  Basic Metabolic Panel: Recent Labs  Lab 07/01/22 1330 07/01/22 1342 07/02/22 1050 07/03/22 0357 07/04/22 0327  NA 134* 135 133* 137 137  K 4.1 4.0 4.0 5.0 3.9  CL 104 102 102 103 104  CO2 24  --  23 18* 25  GLUCOSE 176* 178* 170* 170* 171*  BUN 28* 30* 23 27* 20  CREATININE 0.75 0.70 0.60 0.82 0.61  CALCIUM 8.3*  --  8.9 9.5 8.4*  MG  --   --  2.3  --   --     CBC: Recent Labs  Lab 07/01/22 1330 07/01/22 1342 07/02/22 1050 07/03/22 1550 07/04/22 0327  WBC 14.6*  --  22.0* 17.7* 14.7*  NEUTROABS 9.1*  --  19.8* 15.5* 12.8*  HGB 13.8 14.3 14.7 15.3* 13.5  HCT 40.3 42.0 42.3 44.4 38.6  MCV 98.8  --  96.4 97.8 96.5  PLT 300  --  260 204 183     Coagulation Studies: Recent Labs    07/01/22 1330  LABPROT 14.5  INR 1.1    Imaging MRI brain with and without contrast ordered and pending  ASSESSMENT AND PLAN: 76 year old female with recent diagnosis of high-grade neoplasm in left temporal region who presented with new onset seizures and eventually went into  nonconvulsive status epilepticus which has since resolved.   New onset epilepsy High-grade left temporal neoplasm Acute encephalopathy, multifactorial Leukocytosis (improving) Hyponatremia (resolved) -Seizures improved after phenytoin load yesterday but continues to have epileptiform discharges in left frontal region -Encephalopathy likely secondary to medications, postictal state -Leukocytosis could be secondary to dexamethasone use -Hyponatremia could be secondary to lack of p.o. intake   Recommendations -We will continue current AEDs: Keppra 1500 mg twice daily, Vimpat 200 mg twice daily and Dilantin 100 mg every 8 hours. -If any further seizures, can consider adding perampanel '8mg'$  once if it is p.o. access.  -Unhook EEG to obtain MRI brain with and without contrast -Continue LTM EEG after MRI to look for intermittent seizures -Has follow-up scheduled with Dr. [name] -PT/OT -Discussed plan with son and grandson at bedside in detail   I have spent a total of  36 minutes with the patient reviewing hospital notes,  test results, labs and examining the patient as well as establishing an assessment and plan.  > 50% of time was spent in direct patient care.    Zeb Comfort Epilepsy [facility] For questions after 5pm please refer to AMION to reach the Neurologist on call

## 2022-07-04 NOTE — Progress Notes (Signed)
Pharmacy Antibiotic Note  Miranda Martinez is a 76 y.o. female admitted on 07/01/2022 with MDR Ecoli UTI.  Pharmacy has been consulted for piperacillin/tazobactam dosing x3d.  ClCr 32m/min.   Plan: Piperacillin/tazobactam 3.375g Q8hr x3 days   Weight: 57.8 kg (127 lb 8 oz) (estimated per chart review)  Temp (24hrs), Avg:98 F (36.7 C), Min:97.4 F (36.3 C), Max:98.6 F (37 C)  Recent Labs  Lab 07/01/22 1330 07/01/22 1342 07/02/22 1050 07/03/22 0357 07/03/22 1550 07/04/22 0327  WBC 14.6*  --  22.0*  --  17.7* 14.7*  CREATININE 0.75 0.70 0.60 0.82  --  0.61    Estimated Creatinine Clearance: 50.9 mL/min (by C-G formula based on SCr of 0.61 mg/dL).    Allergies  Allergen Reactions   Codeine Itching   Penicillins Itching    Antimicrobials this admission: pitpazo 10/19 >> 10/22   Microbiology results: 10/18 BCx: ngtd 10/17 UCx: MDR Ecoli     Thank you for allowing pharmacy to be a part of this patient's care.  LBenetta Spar PharmD, BCPS, BCCP Clinical Pharmacist  Please check AMION for all MGasconadephone numbers After 10:00 PM, call [phone]

## 2022-07-04 NOTE — Plan of Care (Signed)
  Problem: Education: Goal: Knowledge of the prescribed therapeutic regimen will improve Outcome: Not Progressing   Problem: Clinical Measurements: Goal: Usual level of consciousness will be regained or maintained. Outcome: Not Progressing Goal: Neurologic status will improve Outcome: Not Progressing Goal: Ability to maintain intracranial pressure will improve Outcome: Not Progressing   Problem: Skin Integrity: Goal: Demonstration of wound healing without infection will improve Outcome: Not Progressing   Problem: Education: Goal: Ability to describe self-care measures that may prevent or decrease complications (Diabetes Survival Skills Education) will improve Outcome: Not Progressing Goal: Individualized Educational Video(s) Outcome: Not Progressing   Problem: Coping: Goal: Ability to adjust to condition or change in health will improve Outcome: Not Progressing   Problem: Fluid Volume: Goal: Ability to maintain a balanced intake and output will improve Outcome: Not Progressing   Problem: Health Behavior/Discharge Planning: Goal: Ability to identify and utilize available resources and services will improve Outcome: Not Progressing Goal: Ability to manage health-related needs will improve Outcome: Not Progressing   Problem: Metabolic: Goal: Ability to maintain appropriate glucose levels will improve Outcome: Not Progressing   Problem: Nutritional: Goal: Maintenance of adequate nutrition will improve Outcome: Not Progressing Goal: Progress toward achieving an optimal weight will improve Outcome: Not Progressing   Problem: Skin Integrity: Goal: Risk for impaired skin integrity will decrease Outcome: Not Progressing   Problem: Tissue Perfusion: Goal: Adequacy of tissue perfusion will improve Outcome: Not Progressing   Problem: Education: Goal: Knowledge of General Education information will improve Description: Including pain rating scale, medication(s)/side effects  and non-pharmacologic comfort measures Outcome: Not Progressing   Problem: Health Behavior/Discharge Planning: Goal: Ability to manage health-related needs will improve Outcome: Not Progressing   Problem: Clinical Measurements: Goal: Ability to maintain clinical measurements within normal limits will improve Outcome: Not Progressing Goal: Will remain free from infection Outcome: Not Progressing Goal: Diagnostic test results will improve Outcome: Not Progressing Goal: Respiratory complications will improve Outcome: Not Progressing Goal: Cardiovascular complication will be avoided Outcome: Not Progressing   Problem: Activity: Goal: Risk for activity intolerance will decrease Outcome: Not Progressing   Problem: Nutrition: Goal: Adequate nutrition will be maintained Outcome: Not Progressing   Problem: Coping: Goal: Level of anxiety will decrease Outcome: Not Progressing   Problem: Elimination: Goal: Will not experience complications related to bowel motility Outcome: Not Progressing Goal: Will not experience complications related to urinary retention Outcome: Not Progressing   Problem: Pain Managment: Goal: General experience of comfort will improve Outcome: Not Progressing   Problem: Safety: Goal: Ability to remain free from injury will improve Outcome: Not Progressing   Problem: Skin Integrity: Goal: Risk for impaired skin integrity will decrease Outcome: Not Progressing

## 2022-07-04 NOTE — Progress Notes (Addendum)
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   Vital signs: Temp: 98.2 F (36.8 C) (10/19 1134) Temp Source: Oral (10/19 1134) BP: 149/85 (10/19 1134) Pulse Rate: 61 (10/19 1134)  Labs: Lab Results  Component Value Date/Time   Phenytoin Lvl 17.1 07/03/2022 1550   Lab Results  Component Value Date   PHENYTOIN 17.1 07/03/2022   Estimated Creatinine Clearance: 50.9 mL/min (by C-G formula based on SCr of 0.61 mg/dL).   Medications:  Scheduled:   dexamethasone (DECADRON) injection  10 mg Intravenous Q6H   insulin aspart  0-9 Units Subcutaneous TID WC   LORazepam  1 mg Intravenous Once   phenytoin (DILANTIN) IV  100 mg Intravenous Q8H   Infusions:   sodium chloride 75 mL/hr at 07/04/22 1039   lacosamide (VIMPAT) IV 200 mg (07/04/22 1043)   levETIRAcetam 1,500 mg (07/04/22 1156)     Assessment: 10/18 Phenytoin level: 17.1 (drawn post-load, not at steady state) Corrected phenytoin level: 22.5 Seizure activity: Seizures improved after phenytoin load 10/17 but continues to have epileptiform discharges in left frontal regimen Significant potential drug interactions: phenytoin may theoretically  decrease dexamethasone but available documentation is poor  Goals of care:  Total phenytoin level: 10-20 mcg/ml Free phenytoin level: 1-2 mcg/ml  Plan:   Order phenytoin level closer to steady state (10/21 before 2am dose) Pharmacy will continue to follow regarding obtaining total phenytoin levels and dose adjustments as indicated.  Benetta Spar, PharmD, BCPS, BCCP Clinical Pharmacist  Please check AMION for all [place] phone numbers After 10:00 PM, call [facility] [phone]

## 2022-07-05 ENCOUNTER — Inpatient Hospital Stay (HOSPITAL_COMMUNITY): Payer: Medicare Other

## 2022-07-05 DIAGNOSIS — Z515 Encounter for palliative care: Secondary | ICD-10-CM | POA: Diagnosis not present

## 2022-07-05 DIAGNOSIS — Z711 Person with feared health complaint in whom no diagnosis is made: Secondary | ICD-10-CM

## 2022-07-05 DIAGNOSIS — R471 Dysarthria and anarthria: Secondary | ICD-10-CM | POA: Diagnosis not present

## 2022-07-05 DIAGNOSIS — C719 Malignant neoplasm of brain, unspecified: Secondary | ICD-10-CM | POA: Diagnosis not present

## 2022-07-05 DIAGNOSIS — R2990 Unspecified symptoms and signs involving the nervous system: Secondary | ICD-10-CM

## 2022-07-05 DIAGNOSIS — Z789 Other specified health status: Secondary | ICD-10-CM

## 2022-07-05 DIAGNOSIS — G40901 Epilepsy, unspecified, not intractable, with status epilepticus: Secondary | ICD-10-CM | POA: Diagnosis not present

## 2022-07-05 DIAGNOSIS — R638 Other symptoms and signs concerning food and fluid intake: Secondary | ICD-10-CM

## 2022-07-05 DIAGNOSIS — Z66 Do not resuscitate: Secondary | ICD-10-CM

## 2022-07-05 LAB — GLUCOSE, CAPILLARY
Glucose-Capillary: 152 mg/dL — ABNORMAL HIGH (ref 70–99)
Glucose-Capillary: 124 mg/dL — ABNORMAL HIGH (ref 70–99)
Glucose-Capillary: 134 mg/dL — ABNORMAL HIGH (ref 70–99)

## 2022-07-05 LAB — CBC WITH DIFFERENTIAL/PLATELET
Abs Immature Granulocytes: 0.1 10*3/uL — ABNORMAL HIGH (ref 0.00–0.07)
Basophils Absolute: 0 10*3/uL (ref 0.0–0.1)
Basophils Relative: 0 %
Eosinophils Absolute: 0 10*3/uL (ref 0.0–0.5)
Eosinophils Relative: 0 %
HCT: 38.2 % (ref 36.0–46.0)
Hemoglobin: 13 g/dL (ref 12.0–15.0)
Immature Granulocytes: 1 %
Lymphocytes Relative: 5 %
Lymphs Abs: 0.5 10*3/uL — ABNORMAL LOW (ref 0.7–4.0)
MCH: 33.4 pg (ref 26.0–34.0)
MCHC: 34 g/dL (ref 30.0–36.0)
MCV: 98.2 fL (ref 80.0–100.0)
Monocytes Absolute: 0.6 10*3/uL (ref 0.1–1.0)
Monocytes Relative: 6 %
Neutro Abs: 9.2 10*3/uL — ABNORMAL HIGH (ref 1.7–7.7)
Neutrophils Relative %: 88 %
Platelets: 180 10*3/uL (ref 150–400)
RBC: 3.89 MIL/uL (ref 3.87–5.11)
RDW: 13.4 % (ref 11.5–15.5)
WBC: 10.3 10*3/uL (ref 4.0–10.5)
nRBC: 0 % (ref 0.0–0.2)

## 2022-07-05 LAB — ALBUMIN: Albumin: 2.8 g/dL — ABNORMAL LOW (ref 3.5–5.0)

## 2022-07-05 LAB — BASIC METABOLIC PANEL
Anion gap: 17 — ABNORMAL HIGH (ref 5–15)
BUN: 17 mg/dL (ref 8–23)
CO2: 25 mmol/L (ref 22–32)
Calcium: 8.8 mg/dL — ABNORMAL LOW (ref 8.9–10.3)
Chloride: 94 mmol/L — ABNORMAL LOW (ref 98–111)
Creatinine, Ser: 0.59 mg/dL (ref 0.44–1.00)
GFR, Estimated: >60 mL/min (ref >60)
Glucose, Bld: 152 mg/dL — ABNORMAL HIGH (ref 70–99)
Potassium: 4 mmol/L (ref 3.5–5.1)
Sodium: 136 mmol/L (ref 135–145)

## 2022-07-05 LAB — PHENYTOIN LEVEL, TOTAL: Phenytoin Lvl: 19.4 ug/mL (ref 10.0–20.0)

## 2022-07-05 MED ORDER — DEXAMETHASONE SODIUM PHOSPHATE 4 MG/ML IJ SOLN
4.0000 mg | Freq: Four times a day (QID) | INTRAMUSCULAR | Status: DC
  Administered 2022-07-05 – 2022-07-06 (×3): 4 mg via INTRAVENOUS
  Filled 2022-07-05 (×4): qty 1

## 2022-07-05 MED ORDER — PHENYTOIN SODIUM 50 MG/ML IJ SOLN
75.0000 mg | Freq: Three times a day (TID) | INTRAMUSCULAR | Status: DC
  Administered 2022-07-05 – 2022-07-06 (×3): 75 mg via INTRAVENOUS
  Filled 2022-07-05 (×5): qty 1.5

## 2022-07-05 NOTE — Progress Notes (Signed)
Speech Language Pathology Treatment: Dysphagia  Patient Details Name: Miranda Martinez MRN: [id number] DOB: 05/06/1946 Today's Date: 07/05/2022 Time: 1950-9326 SLP Time Calculation (min) (ACUTE ONLY): 20 min  Assessment / Plan / Recommendation Clinical Impression  Pt seen as PT had pt sitting EOB to maximize level of alertness for swallowing safety. PT and OT had been working with the pt and noted her eyes open, but her eyes remained primarily closed as SLP was present. With hand-over-hand assist to bring a cup, brush, or wash cloth to her mouth, she would respond with either small oral movements or turning her head. She would partially bite down on the straw (enough to keep it in her oral cavity but not really compress it) but never initiated sucking. When SLP held the straw/spoon and provided only tactile input to her oral cavity, she had no evidence of awareness. Pt's mentation remains her primary barrier to swallowing at the moment. May want to consider temporary, alternative means of nutrition if within [place] for the pt/family.    HPI HPI: 76 y.o. 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      SLP Plan  Continue with current plan of care      Recommendations for follow up therapy are one component of a multi-disciplinary discharge planning process, led by the attending physician.  Recommendations may be updated based on patient status, additional functional criteria and insurance authorization.    Recommendations  Diet recommendations: NPO Medication Administration: Via alternative means                Oral Care Recommendations: Oral care QID Follow Up Recommendations: Skilled nursing-short term rehab (<3 hours/day) Assistance recommended at discharge: Frequent or constant Supervision/Assistance SLP Visit Diagnosis: Dysphagia, unspecified (R13.10) Plan: Continue with current plan of care            Osie Bond., M.A. [place] Office [phone]  Secure chat preferred   07/05/2022, 3:26 PM

## 2022-07-05 NOTE — Plan of Care (Signed)
Pt has slept all shift. No distress noted. 1st neuro assessment pt was difficult to keep awake to do assessment but last 2 neuro assessments was able to keep awake a little longer. Cont EEG resumed around 2130

## 2022-07-05 NOTE — Progress Notes (Signed)
Subjective: More sedated than yesterday  Exam: Vitals:   07/05/22 0739 07/05/22 1118  BP: 137/73 (!) 157/87  Pulse: 69 63  Resp: 15 16  Temp: 98.5 F (36.9 C) 98.6 F (37 C)  SpO2: 91% 97%   Gen: In bed, NAD Resp: non-labored breathing, no acute distress Abd: soft, nt  Neuro: MS: opens eyes to noxious stimulation.  CN: Pupils are reactive bilaterally, keeps eyes tightly closed making blink to threat difficult to test, right facial decreased nasolabial fold Motor: She does move all extremities voluntarily, though I have the impression that she is moving the right side less than the left Sensory: She does respond to noxious stimulation x4  Pertinent Labs: WBC    Impression: 76 year old female with a history of recent brain tumor biopsy who presented with partial status epilepticus. Now no longer seizing, but still with significant irritability that improves during sleep.   She was started on a very high dose of dexamethasone, I will begin coming down on this.   I wonder if her UTI is playing some role in her MS worsening today, but will also check drug levels and repeat head CT.    Recommendations: 1) continue keppra '1500mg'$  BID, check level 2) continue vimpat '200mg'$  BID 3) continue dilantin '100mg'$  TID  4) decrease dexamethaonse to '4mg'$  q6h today 5) continue EEG 6) CT head  Roland Rack, MD [facility] [phone]  If 7pm- 7am, please page neurology on call as listed in [place].

## 2022-07-05 NOTE — Progress Notes (Addendum)
Daily Progress Note   Patient Name: Miranda Martinez       Date: 07/05/2022 DOB: 1946-09-15  Age: 76 y.o. MRN#: [id number] Attending Physician: Modena Jansky, MD Primary Care Physician: [name], DO Admit Date: 07/01/2022  Reason for Consultation/Follow-up: Establishing goals of care  Subjective: Chart review performed - noted per previous notes patient's mental status appears worse today compared to yesterday - mental status seems to be waxing and waning.  Received report from primary RN -no acute concerns.  RN reports patient remains somnolent, only responsive to pain, not eating or drinking.   Went to visit patient at bedside - patient not present.  Per RN, patient taken for CT scan.  No family present at bedside.    3:32 PM Attempted to call patient's son/Rob to discuss diagnosis, prognosis, GOC, EOL wishes, disposition, and options - no answer - confidential voicemail left and PMT phone number provided with request to return call.  Plan on discussing decreased oral intake concern and how this is a poor prognostic indicator.  Will need discussions around tube feeds versus transition to comfort/hospice care.   Length of Stay: 4  Current Medications: Scheduled Meds:   Chlorhexidine Gluconate Cloth  6 each Topical Daily   dexamethasone (DECADRON) injection  10 mg Intravenous Q6H   insulin aspart  0-9 Units Subcutaneous TID WC   phenytoin (DILANTIN) IV  100 mg Intravenous Q8H    Continuous Infusions:  sodium chloride Stopped (07/04/22 1624)   lacosamide (VIMPAT) IV 200 mg (07/05/22 1038)   levETIRAcetam 1,500 mg (07/05/22 1007)   piperacillin-tazobactam (ZOSYN)  IV 3.375 g (07/05/22 0611)    PRN Meds: acetaminophen **OR** acetaminophen (TYLENOL) oral liquid 160 mg/5 mL  **OR** acetaminophen, mouth rinse, senna-docusate  Physical Exam        -unable to perform  Vital Signs: BP (!) 157/87 (BP Location: Right Arm)   Pulse 63   Temp 98.6 F (37 C) (Axillary)   Resp 16   Wt 57.8 kg Comment: estimated per chart review  SpO2 97%   BMI 21.89 kg/m  SpO2: SpO2: 97 % O2 Device: O2 Device: Room Air O2 Flow Rate: O2 Flow Rate (L/min): 2 L/min  Intake/output summary:  Intake/Output Summary (Last 24 hours) at 07/05/2022 1239 Last data filed at 07/05/2022 1121 Gross per 24 hour  Intake 691.13  ml  Output 2100 ml  Net -1408.87 ml   LBM: Last BM Date : 07/03/22 Baseline Weight: Weight: 57.8 kg (estimated per chart review) Most recent weight: Weight: 57.8 kg (estimated per chart review)       Palliative Assessment/Data: PPS 10%      Patient Active Problem List   Diagnosis Date Noted   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 female with recent diagnosis of high-grade glioblastoma who presented to [facility] ED on 07/01/2022 with new onset seizures. She went into non-convulsive status epilepticus which has since resolved.    PMT has been consulted to assist with goals of care conversation.  Assessment: Principal Problem:   Glioblastoma multiforme of brain [facility]) Active Problems:   Stroke-like symptoms   Concern about end of life  Recommendations/Plan: Continue full scope care Continue DNR/DNI as previously documented Unfortunately, unable to speak with son today. Will continue contact for [place] discussions around decreased oral intake concern and feeding tube vs transition to comfort/hospice Ongoing GOC pending clinical course PMT will continue to follow and support holistically  Goals of Care and Additional Recommendations: Limitations on Scope of Treatment: Full Scope Treatment  Code Status:    Code Status  Orders  (From admission, onward)           Start     Ordered   07/02/22 1336  Do not attempt resuscitation (DNR)  Continuous       Question Answer Comment  In the event of cardiac or respiratory ARREST Do not call a "code blue"   In the event of cardiac or respiratory ARREST Do not perform Intubation, CPR, defibrillation or ACLS   In the event of cardiac or respiratory ARREST Use medication by any route, position, wound care, and other measures to relive pain and suffering. May use oxygen, suction and manual treatment of airway obstruction as needed for comfort.      07/02/22 1335           Code Status History     Date Active Date Inactive Code Status Order ID Comments User Context   07/01/2022 1526 07/02/2022 1335 Full Code [id number]  Karmen Bongo, MD ED   06/16/2022 2145 06/21/2022 2334 Full Code [id number]  Lenore Cordia, MD ED       Prognosis:  < 2 weeks if continues with no oral intake  Discharge Planning: To Be Determined  Care plan was discussed with primary RN  Thank you for allowing the Palliative Medicine Team to assist in the care of this patient.   Total Time 25 minutes Prolonged Time Billed  no       Greater than 50%  of this time was spent counseling and coordinating care related to the above assessment and plan.  Lin Landsman, NP  Please contact Palliative Medicine Team phone at [phone] for questions and concerns.   *Portions of this note are a verbal dictation therefore any spelling and/or grammatical errors are due to the "[employer]" system interpretation.

## 2022-07-05 NOTE — Evaluation (Signed)
Occupational Therapy Evaluation Patient Details Name: Miranda Martinez MRN: [id number] DOB: May 11, 1946 Today's Date: 07/05/2022   History of Present Illness 76 y.o. female presents to [facility] hospital on 07/01/2022 with reduced responsiveness and AMS. Pt recently diagnosed with a high-grade glioma. Pt found to be in status epilepticus on EEG, resolved as of 07/04/2022. No significant PMH.   Clinical Impression   Pt presents with decreased arousal, thus decreased participation in OT eval. Pt tolerating PROM of BUE and PROM WFL, but very little activation of BUE. Pt not following commands and only opening eyes very briefly. Pt with no protective response to correct losses of balance. Pt performing all ADL with total A at this time. Pt only responding to noxious stimuli by withdrawal, but not sustained. Pt did push down into therapists hand after multiple repetitions from therapist, but did not appear to be purposeful. Due to limited arousal, recommending long term care at this time and will continue to follow acutely as pt can be aroused to participate in therapy.      Recommendations for follow up therapy are one component of a multi-disciplinary discharge planning process, led by the attending physician.  Recommendations may be updated based on patient status, additional functional criteria and insurance authorization.   Follow Up Recommendations  Long-term institutional care without follow-up therapy Maybe SNF if level of arousal improves.    Assistance Recommended at Discharge Frequent or constant Supervision/Assistance  Patient can return home with the following Two people to help with walking and/or transfers;Two people to help with bathing/dressing/bathroom;Assistance with cooking/housework;Assistance with feeding;Direct supervision/assist for medications management;Direct supervision/assist for financial management;Assist for transportation;Help with stairs or ramp for entrance    Functional Status  Assessment  Patient has had a recent decline in their functional status and/or demonstrates limited ability to make significant improvements in function in a reasonable and predictable amount of time  Equipment Recommendations  Other (comment) (TBD next venue)    Recommendations for Other Services       Precautions / Restrictions Precautions Precautions: Fall Precaution Comments: continuous EEG, seizure Restrictions Weight Bearing Restrictions: No      Mobility Bed Mobility Overal bed mobility: Needs Assistance Bed Mobility: Supine to Sit, Sit to Supine     Supine to sit: Total assist, +2 for physical assistance, HOB elevated Sit to supine: Total assist, +2 for physical assistance, HOB elevated        Transfers Overall transfer level: Needs assistance Equipment used: 2 person hand held assist Transfers: Sit to/from Stand Sit to Stand: Total assist, +2 physical assistance           General transfer comment: dependent transfer with L knee block      Balance Overall balance assessment: Needs assistance Sitting-balance support: No upper extremity supported, Feet supported Sitting balance-Leahy Scale: Zero Sitting balance - Comments: totalA Postural control: Posterior lean                                 ADL either performed or assessed with clinical judgement   ADL Overall ADL's : Needs assistance/impaired Eating/Feeding: Total assistance   Grooming: Total assistance Grooming Details (indicate cue type and reason): washing face sitting EOB with hand over hand with therapist facilitating normalized movement patterns Upper Body Bathing: Total assistance   Lower Body Bathing: Total assistance   Upper Body Dressing : Total assistance   Lower Body Dressing: Total assistance;+2 for physical assistance   Toilet Transfer:  Total assistance;+2 for physical assistance   Toileting- Clothing Manipulation and Hygiene: Total assistance;+2 for physical  assistance   Tub/ Shower Transfer: Total assistance;+2 for physical assistance   Functional mobility during ADLs: Total assistance;+2 for physical assistance General ADL Comments: Not following any commands this session. Does withdrawal to noxious stimuli.     Vision   Vision Assessment?: Vision impaired- to be further tested in functional context Additional Comments: No eye contact. Opening eyes very briefly, not scanning.     Perception     Praxis      Pertinent Vitals/Pain Pain Assessment Pain Assessment: Faces Faces Pain Scale: No hurt     Hand Dominance Left   Extremity/Trunk Assessment Upper Extremity Assessment Upper Extremity Assessment: RUE deficits/detail;LUE deficits/detail RUE Deficits / Details: no active use. Some muscle activation with small movements noted, but nonpurposeful LUE Deficits / Details: Pt with sustained movement and some resisting movement in LUE, but ultimalety has difficulty moving against gravity. Tolerated weight bearing with lateral lean onto L elbow, but support to maintain balance.   Lower Extremity Assessment Lower Extremity Assessment: Defer to PT evaluation RLE Deficits / Details: PROM WFL, PT notes knee extension activation after supine to sit and after PROM of knee in sitting, otherwise no AROM observes. Withdraws to painful stimuli LLE Deficits / Details: PROM WFL, PT notes knee extension activation after supine to sit and after PROM of knee in sitting, otherwise no AROM observes. Withdraws to painful stimuli   Cervical / Trunk Assessment Cervical / Trunk Assessment: Kyphotic   Communication Communication Communication: Expressive difficulties;Receptive difficulties   Cognition Arousal/Alertness: Lethargic Behavior During Therapy: Flat affect Overall Cognitive Status: Difficult to assess                                 General Comments: pt does not follow commands during session, appears to follow through with  initiation of movement in LUE but irregularly. Seemingly no awareness of deficits, unable to correct losses of balance or maintain balance. Does withdrawal to noxious stimuli     General Comments  VSS on RA    Exercises     Shoulder Instructions      Home Living Family/patient expects to be discharged to:: Private residence Living Arrangements: Alone Available Help at Discharge: Family;Available 24 hours/day Type of Home: House Home Access: Stairs to enter [employer] of Steps: 2 Entrance Stairs-Rails: Right Home Layout: Two level;Bed/bath upstairs;1/2 bath on main level     Bathroom Shower/Tub: Teacher, early years/pre: Standard Bathroom Accessibility: Yes   Home Equipment: None   Additional Comments: history obtained from recent admission as the pt was unable to provide a history due to lethargy and communication deficits      Prior Functioning/Environment Prior Level of Function : Independent/Modified Independent;Driving             Mobility Comments: independent and driving prior to admission recent admission in early october          OT Problem List: Decreased strength;Decreased activity tolerance;Impaired balance (sitting and/or standing);Impaired vision/perception;Decreased coordination;Decreased cognition;Decreased safety awareness      OT Treatment/Interventions: Self-care/ADL training;Therapeutic exercise;DME and/or AE instruction;Therapeutic activities;Cognitive remediation/compensation;Visual/perceptual remediation/compensation;Patient/family education;Balance training    OT Goals(Current goals can be found in the care plan section) Acute Rehab OT Goals Patient Stated Goal: Pt unable to state OT Goal Formulation: With patient Time For Goal Achievement: 07/19/22 Potential to Achieve Goals: [place]  OT  Frequency: Min 1X/week    Co-evaluation PT/OT/SLP Co-Evaluation/Treatment: Yes Reason for Co-Treatment: Complexity of the patient's  impairments (multi-system involvement);Necessary to address cognition/behavior during functional activity;For patient/therapist safety;To address functional/ADL transfers PT goals addressed during session: Mobility/safety with mobility OT goals addressed during session: ADL's and self-care SLP goals addressed during session: Swallowing    AM-PAC OT "6 Clicks" Daily Activity     Outcome Measure Help from another person eating meals?: Total Help from another person taking care of personal grooming?: Total Help from another person toileting, which includes using toliet, bedpan, or urinal?: Total Help from another person bathing (including washing, rinsing, drying)?: Total Help from another person to put on and taking off regular upper body clothing?: Total Help from another person to put on and taking off regular lower body clothing?: Total 6 Click Score: 6   End of Session Nurse Communication: Other (comment);Mobility status (min changes in arousal with change in position)  Activity Tolerance: Patient limited by lethargy Patient left: in bed;with call bell/phone within reach;with bed alarm set  OT Visit Diagnosis: Unsteadiness on feet (R26.81);Other symptoms and signs involving the nervous system (R29.898);Other symptoms and signs involving cognitive function;Cognitive communication deficit (R41.841);Muscle weakness (generalized) (M62.81);Low vision, both eyes (H54.2)                Time: 3810-1751 OT Time Calculation (min): 21 min Charges:  OT General Charges $OT Visit: 1 Visit OT Evaluation $OT Eval Moderate Complexity: 1 Mod  Shanda Howells, OTR/L [facility] Acute Rehabilitation Office: [phone]   [name] 07/05/2022, 4:34 PM

## 2022-07-05 NOTE — Evaluation (Signed)
Physical Therapy Evaluation Patient Details Name: Miranda Martinez MRN: [id number] DOB: [birth year] Today's Date: 07/05/2022  History of Present Illness  76 y.o. female presents to [facility] hospital on 07/01/2022 with reduced responsiveness and AMS. Pt recently diagnosed with a high-grade glioma. Pt found to be in status epilepticus on EEG, resolved as of 07/04/2022. No significant PMH.  Clinical Impression  Pt presents to PT with deficits in arousal, greatly limiting participation in therapy at this time. Pt is dependent for all functional mobility and demonstrates no ability to correct for losses of balance. Pt does not follow commands to move extremities consistently at this time, responding to painful stimulation in BLE. Pt does appear to activate LUE extensors after initiation of therapist, however does not consistently do so. PT will follow at a distance to determine if the pt becomes more alert and is better able to participate. If the pt does not become more aroused and able to participate in skilled PT intervention then PT will likely provide education to caregivers and sign off.       Recommendations for follow up therapy are one component of a multi-disciplinary discharge planning process, led by the attending physician.  Recommendations may be updated based on patient status, additional functional criteria and insurance authorization.  Follow Up Recommendations Long-term institutional care without follow-up therapy (may become a SNF candidate if pt becomes more alert and is able to participate better) Can patient physically be transported by private vehicle: No    Assistance Recommended at Discharge Frequent or constant Supervision/Assistance  Patient can return home with the following  Two people to help with walking and/or transfers;Two people to help with bathing/dressing/bathroom;Assistance with cooking/housework;Assistance with feeding;Direct supervision/assist for medications  management;Direct supervision/assist for financial management;Assist for transportation;Help with stairs or ramp for entrance    Equipment Recommendations Hospital bed (hoyer lift, transport chair)  Recommendations for Other Services       Functional Status Assessment Patient has had a recent decline in their functional status and demonstrates the ability to make significant improvements in function in a reasonable and predictable amount of time.     Precautions / Restrictions Precautions Precautions: Fall Precaution Comments: continuous EEG, seizure Restrictions Weight Bearing Restrictions: No      Mobility  Bed Mobility Overal bed mobility: Needs Assistance Bed Mobility: Supine to Sit, Sit to Supine     Supine to sit: Total assist, +2 for physical assistance, HOB elevated Sit to supine: Total assist, +2 for physical assistance, HOB elevated        Transfers Overall transfer level: Needs assistance Equipment used: 2 person hand held assist Transfers: Sit to/from Stand Sit to Stand: Total assist, +2 physical assistance           General transfer comment: dependent transfer with L knee block    Ambulation/Gait                  Stairs            Wheelchair Mobility    Modified Rankin (Stroke Patients Only)       Balance Overall balance assessment: Needs assistance Sitting-balance support: No upper extremity supported, Feet supported Sitting balance-Leahy Scale: Zero Sitting balance - Comments: totalA Postural control: Posterior lean Standing balance support: No upper extremity supported Standing balance-Leahy Scale: Zero Standing balance comment: totalA                             Pertinent Vitals/Pain Pain  Assessment Pain Assessment: Faces Faces Pain Scale: No hurt    Home Living Family/patient expects to be discharged to:: Private residence Living Arrangements: Alone Available Help at Discharge: Family;Available 24  hours/day Type of Home: House Home Access: Stairs to enter Entrance Stairs-Rails: Right Entrance Stairs-Number of Steps: 2   Home Layout: Two level;Bed/bath upstairs;1/2 bath on main level Home Equipment: None Additional Comments: history obtained from recent admission as the pt was unable to provide a history due to lethargy and communication deficits    Prior Function Prior Level of Function : Independent/Modified Independent;Driving             Mobility Comments: independent and driving prior to admission recent admission in early october       Hand Dominance   Dominant Hand: Left    Extremity/Trunk Assessment   Upper Extremity Assessment Upper Extremity Assessment: Defer to OT evaluation    Lower Extremity Assessment Lower Extremity Assessment: RLE deficits/detail;LLE deficits/detail RLE Deficits / Details: PROM WFL, PT notes knee extension activation after supine to sit and after PROM of knee in sitting, otherwise no AROM observes. Withdraws to painful stimuli LLE Deficits / Details: PROM WFL, PT notes knee extension activation after supine to sit and after PROM of knee in sitting, otherwise no AROM observes. Withdraws to painful stimuli    Cervical / Trunk Assessment Cervical / Trunk Assessment: Kyphotic  Communication   Communication: Expressive difficulties;Receptive difficulties  Cognition Arousal/Alertness: Lethargic Behavior During Therapy: Flat affect Overall Cognitive Status: Difficult to assess                                 General Comments: pt does not follow commands during session, appears to follow through with initiation of movement in LUE but irregularly. Seemingly no awareness of deficits, unable to correct losses of balance or maintain balance.        General Comments General comments (skin integrity, edema, etc.): VSS on RA    Exercises     Assessment/Plan    PT Assessment Patient needs continued PT services  PT Problem  List Decreased activity tolerance;Decreased strength;Decreased balance;Decreased mobility;Decreased coordination;Decreased cognition;Decreased knowledge of use of DME;Decreased safety awareness;Decreased knowledge of precautions       PT Treatment Interventions DME instruction;Therapeutic exercise;Balance training;Neuromuscular re-education;Cognitive remediation;Patient/family education;Wheelchair mobility training;Functional mobility training;Therapeutic activities    PT Goals (Current goals can be found in the Care Plan section)  Acute Rehab PT Goals Patient Stated Goal: patient unable to state, PT goal to improve sitting balance and functional mobility quality PT Goal Formulation: Patient unable to participate in goal setting Time For Goal Achievement: 07/19/22 Potential to Achieve Goals: Poor Additional Goals Additional Goal #1: Pt will demonstrate the ability to follow >25% of one step verbal commands to demonstrate improved participation in PT services    Frequency Min 1X/week (1x trial, if no improvement in arousal or command following by next week will provide education for caregivers and sign off)     Co-evaluation PT/OT/SLP Co-Evaluation/Treatment: Yes Reason for Co-Treatment: Complexity of the patient's impairments (multi-system involvement);Necessary to address cognition/behavior during functional activity;For patient/therapist safety;To address functional/ADL transfers PT goals addressed during session: Mobility/safety with mobility;Balance;Strengthening/ROM   SLP goals addressed during session: Swallowing     AM-PAC PT "6 Clicks" Mobility  Outcome Measure Help needed turning from your back to your side while in a flat bed without using bedrails?: Total Help needed moving from lying on your back  to sitting on the side of a flat bed without using bedrails?: Total Help needed moving to and from a bed to a chair (including a wheelchair)?: Total Help needed standing up from a  chair using your arms (e.g., wheelchair or bedside chair)?: Total Help needed to walk in hospital room?: Total Help needed climbing 3-5 steps with a railing? : Total 6 Click Score: 6    End of Session   Activity Tolerance: Patient limited by lethargy Patient left: in bed;with call bell/phone within reach;with bed alarm set;with nursing/sitter in room Nurse Communication: Mobility status;Need for lift equipment PT Visit Diagnosis: Other abnormalities of gait and mobility (R26.89);Other symptoms and signs involving the nervous system (R29.898)    Time: 4174-0814 PT Time Calculation (min) (ACUTE ONLY): 30 min   Charges:   PT Evaluation $PT Eval Moderate Complexity: 1 Mod          Zenaida Niece, PT, DPT Acute Rehabilitation Office [phone]   Zenaida Niece 07/05/2022, 3:39 PM

## 2022-07-05 NOTE — Progress Notes (Signed)
Maintenance done on CZ, F4 and A1, no skin break down at all skin sites. Study restarted and [facility] notified.

## 2022-07-05 NOTE — Progress Notes (Signed)
CT shows minimally more prominent oozing in the tumor, unlikley clinically significant.  I susepct her dilantin level are slightly high leading to increased sedation, reduced dose from '100mg'$  TID to '75mg'$  TID, will follow daily levels.   Roland Rack, MD [facility] [phone]  If 7pm- 7am, please page neurology on call as listed in [facility].

## 2022-07-05 NOTE — Progress Notes (Addendum)
PROGRESS NOTE   Miranda Martinez  HUD:[id number]    DOB: May 16, 1946    DOA: 07/01/2022  PCP: Miranda Margarita, DO   I have briefly reviewed patients previous medical records in Miranda Martinez.  Chief Complaint  Patient presents with   Code Stroke    Brief Narrative:  76 year old female with medical history significant for recent diagnosis of high-grade glioma, supposed to follow-up with neuro-oncology Dr. Mickeal Martinez on 10/19, presented to the ED on 07/01/2022 with strokelike symptoms/seizure-like symptoms.  She is staying with her son and had been functioning independently, until morning PTA when she was noted to be "a little fussier" with "word salad" and generalized weakness.  In the ED, word salad, slumping >aphasia, right facial droop, tremor of right eye.  Stroke code was called.  CT head showed stable findings of high-grade glioma and some blood related to recent procedure/biopsy.  Remains in ED due to lack of beds.  Admitted for possible partial status epilepticus due to high-grade glioma.  Neurology and neurosurgery/palliative care medicine consulted.  LTM EEG completed 10/19.  Mental status appears to be waxing and waning.   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  Neurology consultation appreciated.  Currently on Decadron 10 mg every 6 hours IV, s/p IV Ativan x1 on 10/17, IV Dilantin 100 mg every 8 hours, s/p IV fosphenytoin 1160 Mg x1 dose 10/17, IV Vimpat 200 mg every 12 hours and IV Keppra 1500 mg every 12 hours.  Neurosurgery seen 10/16: Had no specific recommendations other than steroids and AEDs.  Mental status improving.  LTM EEG completed 10/19: Abnormal (lateralized periodic discharges, left hemisphere.  Continuous slow, generalized and lateralized left hemisphere.  Continue current AEDs: Keppra 1500 mg twice daily,  Vimpat 200 mg twice daily and Dilantin 100 mg every 8 hours, IV until able to consistently take p.o.  Has outpatient follow-up with Dr. Mickeal Martinez, neurooncology on 10/26.  Mental status appeared worse this morning compared to yesterday, barely arousable if at all.  Seems to be hooked back on to EEG.  Acute encephalopathy: Secondary to status epilepticus, postictal phase and polypharmacy.  Mental status worse today than yesterday, waxing and waning.  Apparently did not pass swallow eval with SLP 10/19.  Continue gentle IV fluids.  Consider core track feeding, discuss with family.  Acute urinary retention: Bladder scan showed >900 mL, in and out catheterization per protocol.  Emptied 1100 mL this morning.  Likely related to AMS.  Failed to spontaneously void despite 2 attempts of in and out catheterization and now has a vaginal tear and hence Foley catheter placed to remain.  Consider starting Flomax once able to tolerate p.o.  Consider voiding trial in 48 hours and when mental status has improved.  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  Further evaluation pending.  Has follow-up to see Dr. Mickeal Martinez, neurooncology on 07/11/2022.  Repeat MRI brain ordered by Dr. Hortense Martinez and pending.  Currently on dexamethasone 10 mg IV every 6 hours.  MRI brain 10/19: No acute abnormality identified.  No significant interval change of the known infiltrative high-grade primary neoplasm involving most of the left cerebral hemisphere with mass effect resulting in 9 mm rightward midline shift, similar to prior.  Leukocytosis: Likely related to steroids and stress response.  Afebrile.  Blood cultures x2: Negative to date and urine culture: E. coli, MDR.  Although has  many bacteria on urine microscopy, no significant leukocytes, suspect asymptomatic bacteriuria.  However given her AMS, unable to verify symptoms with her.  Thereby will treat as below.  E. coli acute cystitis: IV Zosyn started 10/19 x3  days.  Hyperglycemia: Secondary to steroids.  A1c 5.5.  Could consider monitoring CBGs and SSI.  [place] discussions: Palliative care input 10/17 appreciated.  Patient now DNR but continue full scope of treatment.  Son willing to transition to comfort care/hospice if she does not improve with meaningful quality of life.  Body mass index is 21.89 kg/m.   DVT prophylaxis: SCD's Start: 07/01/22 1525     Code Status: DNR:  Family Communication: Son via phone.  Updated him regarding her worsening compared to yesterday, overall poor prognosis and advised him that if she does not improve then we may be heading towards comfort care and hospice.  He verbalized understanding. Disposition:  Status is: Inpatient Remains inpatient appropriate because: Status epilepticus     Consultants:   Neurology Neurosurgery Palliative care medicine  Procedures:     Antimicrobials:   TM EEG   Subjective:  Patient extremely somnolent, barely arousable, moves her head and neck but no eye-opening, verbal response.  Some spontaneous upper extremity movements.  Objective:   Vitals:   07/04/22 2355 07/05/22 0411 07/05/22 0739 07/05/22 1118  BP: (!) 153/88 (!) 148/81 137/73 (!) 157/87  Pulse: 66 69 69 63  Resp: '15 20 15 16  '$ Temp:  98.4 F (36.9 C) 98.5 F (36.9 C) 98.6 F (37 C)  TempSrc:  Axillary Axillary Axillary  SpO2: 94% 94% 91% 97%  Weight:        General exam: Elderly female, moderately built and poorly nourished lying supine in bed.  Does not appear in any distress.  Hooked back up to EEG. Respiratory system: Clear to auscultation.  No increased work of breathing. Cardiovascular system: S1 and S2 heard, RRR.  No JVD, murmurs or pedal edema.  Telemetry personally reviewed: SB in the 50s-SR in the 60s. Gastrointestinal system: Abdomen is nondistended, soft and nontender.  Normal bowel sounds heard.  Again subumbilical bladder distention appreciated. Central nervous system: Mental status as  noted above, worse than yesterday, appears to be waxing and waning. Extremities: Moving upper extremities spontaneously. Skin: No rashes, lesions or ulcers Psychiatry: Judgement and insight impaired.  Mood & affect cannot be assessed.    Data Reviewed:   I have personally reviewed following labs and imaging studies   CBC: Recent Labs  Lab 07/03/22 1550 07/04/22 0327 07/05/22 0330  WBC 17.7* 14.7* 10.3  NEUTROABS 15.5* 12.8* 9.2*  HGB 15.3* 13.5 13.0  HCT 44.4 38.6 38.2  MCV 97.8 96.5 98.2  PLT 204 183 856    Basic Metabolic Panel: Recent Labs  Lab 07/01/22 1330 07/01/22 1342 07/02/22 1050 07/03/22 0357 07/04/22 0327  NA 134* 135 133* 137 137  K 4.1 4.0 4.0 5.0 3.9  CL 104 102 102 103 104  CO2 24  --  23 18* 25  GLUCOSE 176* 178* 170* 170* 171*  BUN 28* 30* 23 27* 20  CREATININE 0.75 0.70 0.60 0.82 0.61  CALCIUM 8.3*  --  8.9 9.5 8.4*  MG  --   --  2.3  --   --     Liver Function Tests: Recent Labs  Lab 07/01/22 1330  AST 20  ALT 24  ALKPHOS 26*  BILITOT 1.2  PROT 5.9*  ALBUMIN 3.3*    CBG: Recent Labs  Lab 07/04/22  2107 07/05/22 0609 07/05/22 1117  GLUCAP 131* 152* 124*    Microbiology Studies:   Recent Results (from the past 240 hour(s))  Urine Culture     Status: Abnormal   Collection Time: 07/02/22  7:06 AM   Specimen: Urine, Clean Catch  Result Value Ref Range Status   Specimen Description URINE, CLEAN CATCH  Final   Special Requests   Final    NONE Performed at [facility], [place] [address] [place], [place] [address]    Culture (A)  Final    >=100,000 COLONIES/mL ESCHERICHIA COLI Confirmed Extended Spectrum Beta-Lactamase Producer (ESBL).  In bloodstream infections from ESBL organisms, carbapenems are preferred over piperacillin/tazobactam. They are shown to have a lower risk of mortality.    Report Status 07/04/2022 FINAL  Final   Organism ID, Bacteria ESCHERICHIA COLI (A)  Final      Susceptibility   Escherichia coli -  MIC*    AMPICILLIN >=32 RESISTANT Resistant     CEFAZOLIN >=64 RESISTANT Resistant     CEFEPIME 16 RESISTANT Resistant     CEFTRIAXONE >=64 RESISTANT Resistant     CIPROFLOXACIN 0.5 INTERMEDIATE Intermediate     GENTAMICIN >=16 RESISTANT Resistant     IMIPENEM <=0.25 SENSITIVE Sensitive     NITROFURANTOIN <=16 SENSITIVE Sensitive     TRIMETH/SULFA <=20 SENSITIVE Sensitive     AMPICILLIN/SULBACTAM >=32 RESISTANT Resistant     PIP/TAZO <=4 SENSITIVE Sensitive     * >=100,000 COLONIES/mL ESCHERICHIA COLI  Culture, blood (Routine X 2) w Reflex to ID Panel     Status: None (Preliminary result)   Collection Time: 07/03/22  3:57 AM   Specimen: BLOOD LEFT WRIST  Result Value Ref Range Status   Specimen Description BLOOD LEFT WRIST  Final   Special Requests   Final    BOTTLES DRAWN AEROBIC ONLY Blood Culture results may not be optimal due to an inadequate volume of blood received in culture bottles   Culture   Final    NO GROWTH 2 DAYS Performed at [facility] [address] [place], [place] [address]    Report Status PENDING  Incomplete  Culture, blood (Routine X 2) w Reflex to ID Panel     Status: None (Preliminary result)   Collection Time: 07/03/22  3:50 PM   Specimen: BLOOD  Result Value Ref Range Status   Specimen Description BLOOD BLOOD LEFT HAND  Final   Special Requests   Final    BOTTLES DRAWN AEROBIC AND ANAEROBIC Blood Culture results may not be optimal due to an inadequate volume of blood received in culture bottles   Culture   Final    NO GROWTH 2 DAYS Performed at [facility], [place] [address], [place], [place] [address]    Report Status PENDING  Incomplete    Radiology Studies:  MR BRAIN W WO CONTRAST  Result Date: 07/04/2022 CLINICAL DATA:  Seizure disorder, clinical change. EXAM: MRI HEAD WITHOUT AND WITH CONTRAST TECHNIQUE: Multiplanar, multiecho pulse sequences of the brain and surrounding structures were obtained without and with intravenous  contrast. CONTRAST:  34m GADAVIST GADOBUTROL 1 MMOL/ML IV SOLN COMPARISON:  MRI of the brain June 16, 2022; head CT July 01 2022. FINDINGS: Brain: No acute infarction, acute hemorrhage, hydrocephalus or extra-axial collection. No significant interval change of the diffuse infiltrative T2 hyperintensity involving most of the left cerebral hemisphere including left frontal, temporal, parietal and occipital lobes, basal ganglia and thalamus with loss of gray-white differentiation,  mass effect with gyral effacement and a 9 mm rightward midline shift. No ventricular entrapment. Left periatrial area of mildly restricted diffusion, likely corresponding to area of hypercellularity is unchanged. No focus of abnormal contrast enhancement identified. Vascular: Normal flow voids. Skull and upper cervical spine: No focal marrow lesion identified. Burr hole in the left temporal region. Sinuses/Orbits: Negative. Other: None. IMPRESSION: 1. No acute abnormality identified. 2. No significant interval change of the known infiltrative high-grade primary neoplasm involving most of the left cerebral hemisphere with mass effect resulting in a 9 mm rightward midline shift, similar to prior. No ventricular entrapment. Electronically Signed   By: Pedro Earls M.D.   On: 07/04/2022 15:56    Scheduled Meds:    Chlorhexidine Gluconate Cloth  6 each Topical Daily   dexamethasone (DECADRON) injection  10 mg Intravenous Q6H   insulin aspart  0-9 Units Subcutaneous TID WC   phenytoin (DILANTIN) IV  100 mg Intravenous Q8H    Continuous Infusions:    sodium chloride Stopped (07/04/22 1624)   lacosamide (VIMPAT) IV 200 mg (07/05/22 1038)   levETIRAcetam 1,500 mg (07/05/22 1007)   piperacillin-tazobactam (ZOSYN)  IV 3.375 g (07/05/22 9169)     LOS: 4 days     Vernell Leep, MD,  FACP, FHM, [facility], [facility], [place]     To contact the attending  provider between 7A-7P or the covering provider during after hours 7P-7A, please log into the web site [email or web address] and access using universal [place] password for that web site. If you do not have the password, please call the hospital operator.  07/05/2022, 12:44 PM

## 2022-07-05 NOTE — TOC Initial Note (Signed)
Transition of Care [facility]) - Initial/Assessment Note    Patient Details  Name: Miranda Martinez MRN: [id number] Date of Birth: 10-Jan-1946  Transition [facility]) CM/SW Contact:    Pollie Friar, RN Phone Number: 07/05/2022, 2:32 PM  Clinical Narrative:                 Pt recently discharged to sons home with [facility] services through [facility].  Awaiting mental status to improve vs potential hospice services.  TOC following.  Expected Discharge Plan: [place] Barriers to Discharge: Continued Medical Work up   Patient Goals and CMS Choice   CMS Medicare.gov Compare Post Acute Care list provided to:: Patient Represenative (must comment) Choice offered to / list presented to : Adult Children  Expected Discharge Plan and Services Expected Discharge Plan: [place] In-house Referral: Clinical Social Work Discharge Planning Services: CM Consult                                          Prior Living Arrangements/Services   Lives with:: Adult Children Patient language and need for interpreter reviewed:: Yes Do you feel safe going back to the place where you live?: Yes          Current home services: Home OT, Home PT Criminal Activity/Legal Involvement Pertinent to Current Situation/Hospitalization: No - Comment as needed  Activities of Daily Living      Permission Sought/Granted                  Emotional Assessment           Psych Involvement: No (comment)  Admission diagnosis:  Dysarthria [R47.1] Glioblastoma multiforme of brain [facility]) [C71.9] Patient Active Problem List   Diagnosis Date Noted   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

## 2022-07-06 DIAGNOSIS — T420X1D Poisoning by hydantoin derivatives, accidental (unintentional), subsequent encounter: Secondary | ICD-10-CM | POA: Diagnosis not present

## 2022-07-06 DIAGNOSIS — R471 Dysarthria and anarthria: Secondary | ICD-10-CM | POA: Diagnosis not present

## 2022-07-06 DIAGNOSIS — R569 Unspecified convulsions: Secondary | ICD-10-CM | POA: Diagnosis not present

## 2022-07-06 DIAGNOSIS — G9340 Encephalopathy, unspecified: Secondary | ICD-10-CM | POA: Diagnosis not present

## 2022-07-06 DIAGNOSIS — R2990 Unspecified symptoms and signs involving the nervous system: Secondary | ICD-10-CM | POA: Diagnosis not present

## 2022-07-06 DIAGNOSIS — Z7189 Other specified counseling: Secondary | ICD-10-CM | POA: Diagnosis not present

## 2022-07-06 DIAGNOSIS — C719 Malignant neoplasm of brain, unspecified: Secondary | ICD-10-CM | POA: Diagnosis not present

## 2022-07-06 LAB — CBC WITH DIFFERENTIAL/PLATELET
Abs Immature Granulocytes: 0.04 10*3/uL (ref 0.00–0.07)
Basophils Absolute: 0 10*3/uL (ref 0.0–0.1)
Basophils Relative: 0 %
Eosinophils Absolute: 0 10*3/uL (ref 0.0–0.5)
Eosinophils Relative: 0 %
HCT: 40.8 % (ref 36.0–46.0)
Hemoglobin: 14.2 g/dL (ref 12.0–15.0)
Immature Granulocytes: 1 %
Lymphocytes Relative: 17 %
Lymphs Abs: 1.4 10*3/uL (ref 0.7–4.0)
MCH: 33.4 pg (ref 26.0–34.0)
MCHC: 34.8 g/dL (ref 30.0–36.0)
MCV: 96 fL (ref 80.0–100.0)
Monocytes Absolute: 0.7 10*3/uL (ref 0.1–1.0)
Monocytes Relative: 9 %
Neutro Abs: 6 10*3/uL (ref 1.7–7.7)
Neutrophils Relative %: 73 %
Platelets: 162 10*3/uL (ref 150–400)
RBC: 4.25 MIL/uL (ref 3.87–5.11)
RDW: 13 % (ref 11.5–15.5)
WBC: 8.1 10*3/uL (ref 4.0–10.5)
nRBC: 0 % (ref 0.0–0.2)

## 2022-07-06 LAB — GLUCOSE, CAPILLARY
Glucose-Capillary: 151 mg/dL — ABNORMAL HIGH (ref 70–99)
Glucose-Capillary: 152 mg/dL — ABNORMAL HIGH (ref 70–99)
Glucose-Capillary: 152 mg/dL — ABNORMAL HIGH (ref 70–99)
Glucose-Capillary: 158 mg/dL — ABNORMAL HIGH (ref 70–99)
Glucose-Capillary: 133 mg/dL — ABNORMAL HIGH (ref 70–99)
Glucose-Capillary: 142 mg/dL — ABNORMAL HIGH (ref 70–99)
Glucose-Capillary: 101 mg/dL — ABNORMAL HIGH (ref 70–99)
Glucose-Capillary: 124 mg/dL — ABNORMAL HIGH (ref 70–99)

## 2022-07-06 LAB — PHENYTOIN LEVEL, TOTAL: Phenytoin Lvl: 19.1 ug/mL (ref 10.0–20.0)

## 2022-07-06 MED ORDER — DEXAMETHASONE SODIUM PHOSPHATE 4 MG/ML IJ SOLN
4.0000 mg | Freq: Two times a day (BID) | INTRAMUSCULAR | Status: DC
  Administered 2022-07-06 – 2022-07-16 (×21): 4 mg via INTRAVENOUS
  Filled 2022-07-06 (×21): qty 1

## 2022-07-06 MED ORDER — PHENYTOIN SODIUM 50 MG/ML IJ SOLN
75.0000 mg | Freq: Three times a day (TID) | INTRAMUSCULAR | Status: DC
  Administered 2022-07-07 – 2022-07-08 (×4): 75 mg via INTRAVENOUS
  Filled 2022-07-06 (×5): qty 1.5

## 2022-07-06 NOTE — Progress Notes (Addendum)
Subjective: Slightly more awake than yesterday, but still more sedated than 2 days ago  Exam: Vitals:   07/06/22 0433 07/06/22 0738  BP:  (!) 149/84  Pulse:  69  Resp:  16  Temp: 98.9 F (37.2 C) 98.7 F (37.1 C)  SpO2:  96%   Gen: In bed, NAD Resp: non-labored breathing, no acute distress Abd: soft, nt  Neuro: MS: opens eyes to voice, she does follow command to stick out her tongue but not other commands.  She tries to speak, and I think she tries to answer her name when I ask it, but is dysarthric. CN: Pupils are reactive bilaterally, right facial decreased nasolabial fold Motor: Moves the right less than the left, but does move both spontaneously. Sensory: She does respond to noxious stimulation x4  Pertinent Labs: WBC 8.1 Dilantin 19.4, albumin 2.8 yesterday, corrects to 22.8  Impression: 76 year old female with a history of recent brain tumor biopsy who presented with partial status epilepticus.  My suspicion is that her worsening yesterday was primarily due to her Dilantin level, rather than physiological worsening.  She was started on a very high dose of dexamethasone, I began coming down on this yesterday, I will decrease to 4 mg every 12 today, and continue to do this for a while.  There was minimal increased conspicuity of the intratumoral hemorrhage on CT yesterday, unlikely to be clinically significant.  She has not had seizures in several days, but I am making adjustments to her antiepileptics and given how hard she was to control initially I would favor continued EEG monitoring for at least another day.  Recommendations: 1) continue keppra '1500mg'$  BID 2) continue vimpat '200mg'$  BID 3) continue dilantin '75mg'$  TID (decreased from 100 mg 3 times daily on 10/20) 4) decrease dexamethaonse to '4mg'$  q12h today 5) continue EEG 6) neurology will follow  Roland Rack, MD [facility] [phone]  If 7pm- 7am, please page neurology on call as listed in  [place].

## 2022-07-06 NOTE — Procedures (Addendum)
Patient Name: Miranda Martinez  MRN: [id number]  Epilepsy Attending: Lora Havens  Referring Physician/Provider: Judith Part, MD  Duration: 07/05/2022 0600 to 07/06/2022 0600   Patient history: 76 y.o. 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 EEG to evaluate for seizure.    Level of alertness:  lethargic , asleep   AEDs during EEG study: LEV, PHT, LCM   Technical aspects: This EEG study was done with scalp electrodes positioned according to the 10-20 International system of electrode placement. Electrical activity was reviewed with band pass filter of 1-'70Hz'$ , sensitivity of 7 uV/mm, display speed of 30m/sec with a '60Hz'$  notched filter applied as appropriate. EEG data were recorded continuously and digitally stored.  Video monitoring was available and reviewed as appropriate.   Description: During awake state, no clear posterior dominant rhythm was seen. Sleep was characterized by sleep spindles ( 12-'14Hz'$ ), fronto-central region EEG showed continuous generalized and lateralized left hemisphere 3 to 6 Hz theta-delta slowing. Sharp waves were noted in left hemisphere, maximal left frontal region which appeared quasiperiodic at 0.5 to 1 Hz. Hyperventilation and photic stimulation were not performed.    ABNORMALITY - Lateralized periodic discharges, left hemisphere - Continuous slow, generalized and lateralized left hemisphere   IMPRESSION: This study was suggestive of epileptogenicity and cortical dysfunction in left hemisphere, maximal left frontal region secondary to underlying structural abnormality. Additionally there is moderate diffuse encephalopathy, nonspecific etiology. No seizures were seen during this study.   Dekendrick Uzelac OBarbra Sarks

## 2022-07-06 NOTE — Progress Notes (Addendum)
LTM maint complete - no skin breakdown  Serviced [facility] monitored, Event button test confirmed by [facility].

## 2022-07-06 NOTE — Progress Notes (Signed)
PROGRESS NOTE   Miranda Martinez  JME:[id number]    DOB: Aug 06, 1946    DOA: 07/01/2022  PCP: [name], DO   I have briefly reviewed patients previous medical records in [facility].  Chief Complaint  Patient presents with   Code Stroke    Brief Narrative:  76 year old female with medical history significant for recent diagnosis of high-grade glioma, supposed to follow-up with neuro-oncology Dr. [name] on 10/19, presented to the ED on 07/01/2022 with strokelike symptoms/seizure-like symptoms.  She is staying with her son and had been functioning independently, until morning PTA when she was noted to be "a little fussier" with "word salad" and generalized weakness.  In the ED, word salad, slumping >aphasia, right facial droop, tremor of right eye.  Stroke code was called.  CT head showed stable findings of high-grade glioma and some blood related to recent procedure/biopsy.  Remains in ED due to lack of beds.  Admitted for possible partial status epilepticus due to high-grade glioma.  Neurology and neurosurgery/palliative care medicine consulted.  LTM EEG completed 10/19.  Mental status appears to be waxing and waning, however yesterday more due to supratherapeutic Dilantin.  Mental status may be starting to improve.   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  Neurology consultation appreciated.  Currently on Decadron 10 mg every 6 hours IV, s/p IV Ativan x1 on 10/17, IV Dilantin 100 mg every 8 hours, s/p IV fosphenytoin 1160 Mg x1 dose 10/17, IV Vimpat 200 mg every 12 hours and IV Keppra 1500 mg every 12 hours.  Neurosurgery seen 10/16: Had no specific recommendations other than steroids and AEDs.  Mental status improving.  LTM EEG completed 10/19: Abnormal (lateralized periodic discharges, left hemisphere.  Continuous  slow, generalized and lateralized left hemisphere.  Continue current AEDs: Keppra 1500 mg twice daily, Vimpat 200 mg twice daily and Dilantin 100 mg every 8 hours, IV until able to consistently take p.o.  Has outpatient follow-up with Dr. [name], neurooncology on 10/26.  Mental status appears to be waxing and waning, 10/20 more due to supratherapeutic Dilantin, reduced dose.  Mental status may be starting to improve.  Remains on LTM EEG.  Neurology closely following and have also cut down the dose of IV steroids.  Acute encephalopathy: Secondary to status epilepticus, postictal phase and polypharmacy.  Continue gentle IV fluids.  Rest as above.  Discussed with son, want to give her another 24 hours and if no significant improvement in terms of initiation of oral feeding, then wishes to pursue core track with tube feeds.  Acute urinary retention: Bladder scan showed >900 mL, in and out catheterization per protocol.  Emptied 1100 mL this morning.  Likely related to AMS.  Failed to spontaneously void despite 2 attempts of in and out catheterization and now has a vaginal tear and hence Foley catheter placed to remain.  Consider starting Flomax once able to tolerate p.o.  Consider voiding trial in 48 hours and when mental status has improved.  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  Further evaluation pending.  Has follow-up to see Dr. [name], neurooncology on 07/11/2022.  High-dose dexamethasone 10 mg IV every 6 hours normal reduce to 4 mg every 12 hours.  MRI brain 10/19: No acute abnormality identified.  No significant interval change of the known infiltrative high-grade primary neoplasm involving most of the left cerebral hemisphere  with mass effect resulting in 9 mm rightward midline shift, similar to prior.  CT head 10/20: Does not seem to have significant change compared to prior.  Leukocytosis: Likely related to steroids and stress response.  Afebrile.  Blood cultures x2: Negative to date  and urine culture: E. coli, MDR.  Although has many bacteria on urine microscopy, no significant leukocytes, suspect asymptomatic bacteriuria.  However given her AMS, unable to verify symptoms with her.  Thereby will treat as below.  E. coli acute cystitis: IV Zosyn started 10/19 x3 days.  Hyperglycemia: Secondary to steroids.  A1c 5.5.  Could consider monitoring CBGs and SSI.  [place] discussions: Palliative care continue to assist.  They are follow-up 10/21 appreciated, patient interested in temporary feeding tube.  Patient now DNR but continue full scope of treatment.  Son willing to transition to comfort care/hospice if she does not improve with meaningful quality of life.  Body mass index is 21.89 kg/m.   DVT prophylaxis: SCD's Start: 07/01/22 1525     Code Status: DNR:  Family Communication: Son via phone.  Updated him regarding her worsening compared to yesterday, overall poor prognosis and advised him that if she does not improve then we may be heading towards comfort care and hospice.  He verbalized understanding. Disposition:  Status is: Inpatient Remains inpatient appropriate because: Status epilepticus     Consultants:   Neurology Neurosurgery Palliative care medicine  Procedures:     Antimicrobials:   TM EEG   Subjective:  Patient slightly more awake this morning.  Was holding her left arm up by herself.  Still without eye-opening, verbal response or motor response to commands.  Objective:   Vitals:   07/06/22 0433 07/06/22 0738 07/06/22 1235 07/06/22 1546  BP:  (!) 149/84 (!) 147/82 131/82  Pulse:  69 96 74  Resp:  '16 19 18  '$ Temp: 98.9 F (37.2 C) 98.7 F (37.1 C) 98.8 F (37.1 C) 98.9 F (37.2 C)  TempSrc: Axillary Axillary Axillary   SpO2:  96% 98% 97%  Weight:        General exam: Elderly female, moderately built and poorly nourished lying supine in bed.  Does not appear in any distress.  Hooked back up to EEG. Respiratory system: Clear to  auscultation.  No increased work of breathing. Cardiovascular system: S1 and S2 heard, RRR.  No JVD, murmurs or pedal edema.  Telemetry personally reviewed: SB in the 50s-SR in the 60s. Gastrointestinal system: Abdomen is nondistended, soft and nontender.  Normal bowel sounds heard.  Again subumbilical bladder distention appreciated. Central nervous system: Mental status as noted above. Extremities: Right upper extremity flaccidity, 0/5 power during my exam. Skin: No rashes, lesions or ulcers Psychiatry: Judgement and insight impaired.  Mood & affect cannot be assessed.    Data Reviewed:   I have personally reviewed following labs and imaging studies   CBC: Recent Labs  Lab 07/04/22 0327 07/05/22 0330 07/06/22 0507  WBC 14.7* 10.3 8.1  NEUTROABS 12.8* 9.2* 6.0  HGB 13.5 13.0 14.2  HCT 38.6 38.2 40.8  MCV 96.5 98.2 96.0  PLT 183 180 062    Basic Metabolic Panel: Recent Labs  Lab 07/01/22 1330 07/01/22 1342 07/02/22 1050 07/03/22 0357 07/04/22 0327 07/05/22 1353  NA 134* 135 133* 137 137 136  K 4.1 4.0 4.0 5.0 3.9 4.0  CL 104 102 102 103 104 94*  CO2 24  --  23 18* 25 25  GLUCOSE 176* 178* 170* 170* 171* 152*  BUN 28* 30* 23 27* 20 17  CREATININE 0.75 0.70 0.60 0.82 0.61 0.59  CALCIUM 8.3*  --  8.9 9.5 8.4* 8.8*  MG  --   --  2.3  --   --   --     Liver Function Tests: Recent Labs  Lab 07/01/22 1330 07/05/22 1422  AST 20  --   ALT 24  --   ALKPHOS 26*  --   BILITOT 1.2  --   PROT 5.9*  --   ALBUMIN 3.3* 2.8*    CBG: Recent Labs  Lab 07/05/22 2229 07/06/22 0614 07/06/22 1228  GLUCAP 158* 133* 142*    Microbiology Studies:   Recent Results (from the past 240 hour(s))  Urine Culture     Status: Abnormal   Collection Time: 07/02/22  7:06 AM   Specimen: Urine, Clean Catch  Result Value Ref Range Status   Specimen Description URINE, CLEAN CATCH  Final   Special Requests   Final    NONE Performed at [facility], [place] [address],  [place], [place] [address]    Culture (A)  Final    >=100,000 COLONIES/mL ESCHERICHIA COLI Confirmed Extended Spectrum Beta-Lactamase Producer (ESBL).  In bloodstream infections from ESBL organisms, carbapenems are preferred over piperacillin/tazobactam. They are shown to have a lower risk of mortality.    Report Status 07/04/2022 FINAL  Final   Organism ID, Bacteria ESCHERICHIA COLI (A)  Final      Susceptibility   Escherichia coli - MIC*    AMPICILLIN >=32 RESISTANT Resistant     CEFAZOLIN >=64 RESISTANT Resistant     CEFEPIME 16 RESISTANT Resistant     CEFTRIAXONE >=64 RESISTANT Resistant     CIPROFLOXACIN 0.5 INTERMEDIATE Intermediate     GENTAMICIN >=16 RESISTANT Resistant     IMIPENEM <=0.25 SENSITIVE Sensitive     NITROFURANTOIN <=16 SENSITIVE Sensitive     TRIMETH/SULFA <=20 SENSITIVE Sensitive     AMPICILLIN/SULBACTAM >=32 RESISTANT Resistant     PIP/TAZO <=4 SENSITIVE Sensitive     * >=100,000 COLONIES/mL ESCHERICHIA COLI  Culture, blood (Routine X 2) w Reflex to ID Panel     Status: None (Preliminary result)   Collection Time: 07/03/22  3:57 AM   Specimen: BLOOD LEFT WRIST  Result Value Ref Range Status   Specimen Description BLOOD LEFT WRIST  Final   Special Requests   Final    BOTTLES DRAWN AEROBIC ONLY Blood Culture results may not be optimal due to an inadequate volume of blood received in culture bottles   Culture   Final    NO GROWTH 3 DAYS Performed at [place] [address], [place],  [address]    Report Status PENDING  Incomplete  Culture, blood (Routine X 2) w Reflex to ID Panel     Status: None (Preliminary result)   Collection Time: 07/03/22  3:50 PM   Specimen: BLOOD  Result Value Ref Range Status   Specimen Description BLOOD BLOOD LEFT HAND  Final   Special Requests   Final    BOTTLES DRAWN AEROBIC AND ANAEROBIC Blood Culture results may not be optimal due to an inadequate volume of blood received in culture bottles   Culture   Final     NO GROWTH 3 DAYS Performed at [facility], [place] [address], [place],  [address]    Report Status PENDING  Incomplete    Radiology Studies:  CT HEAD WO CONTRAST (5MM)  Result Date: 07/05/2022  CLINICAL DATA:  Mental status change EXAM: CT HEAD WITHOUT CONTRAST TECHNIQUE: Contiguous axial images were obtained from the base of the skull through the vertex without intravenous contrast. RADIATION DOSE REDUCTION: This exam was performed according to the departmental dose-optimization program which includes automated exposure control, adjustment of the mA and/or kV according to patient size and/or use of iterative reconstruction technique. COMPARISON:  07/01/2022 CT head, 07/04/2022 MRI head FINDINGS: Brain: Redemonstrated infiltrative tumor in the left cerebral hemisphere, which is better evaluated on the prior MRI. Slightly increased hyperdense hemorrhage associated with the left periatrial hypercellular component (series 5, image 17). 8 mm of left-to-right midline shift, unchanged when remeasured similarly. Unchanged mass effect on the left-greater-than-right lateral ventricle and third ventricle. Again noted is a small amount of hyperdense hemorrhage adjacent to the left parietal burr hole. No evidence of acute infarction, hydrocephalus, or new extra-axial fluid collection. Vascular: No hyperdense vessel. Skull: Left parietal burr hole. Negative for fracture or focal lesion. Sinuses/Orbits: No acute finding. Other: The mastoid air cells are well aerated. IMPRESSION: 1. Redemonstrated infiltrative tumor in the left cerebral hemisphere, which is better evaluated on the prior MRI. Slightly increased hyperdense hemorrhage associated with the left periatrial hypercellular component compared to 07/01/2022. 2. Unchanged 8 mm of left-to-right midline shift. Unchanged mass effect on the left-greater-than-right lateral ventricle and third ventricle. 3. Unchanged small amount of hyperdense hemorrhage  adjacent to the left parietal burr hole. These results will be called to the ordering clinician or representative by the Radiologist Assistant, and communication documented in the PACS or [employer]. Electronically Signed   By: Merilyn Baba M.D.   On: 07/05/2022 15:56    Scheduled Meds:    Chlorhexidine Gluconate Cloth  6 each Topical Daily   dexamethasone (DECADRON) injection  4 mg Intravenous Q12H   insulin aspart  0-9 Units Subcutaneous TID WC   phenytoin (DILANTIN) IV  75 mg Intravenous Q8H    Continuous Infusions:    sodium chloride Stopped (07/04/22 1624)   lacosamide (VIMPAT) IV 200 mg (07/06/22 1140)   levETIRAcetam 1,500 mg (07/06/22 1231)   piperacillin-tazobactam (ZOSYN)  IV 3.375 g (07/06/22 1442)     LOS: 5 days     Vernell Leep, MD,  FACP, FHM, [facility], [facility], [place]     To contact the attending provider between 7A-7P or the covering provider during after hours 7P-7A, please log into the web site [email or web address] and access using universal [place] password for that web site. If you do not have the password, please call the hospital operator.  07/06/2022, 4:11 PM

## 2022-07-06 NOTE — Progress Notes (Signed)
Daily Progress Note   Patient Name: Miranda Martinez       Date: 07/06/2022 DOB: 05-Oct-1945  Age: 76 y.o. MRN#: [id number] Attending Physician: Miranda Jansky, MD Primary Care Physician: Miranda Margarita, DO Admit Date: 07/01/2022  Reason for Consultation/Follow-up: Establishing goals of care  Subjective: Medical records reviewed. Patient assessed at the bedside.  She remains lethargic, will briefly arouse to my voice and moves extremities.  No family present during my visit.  I then called patient's son Miranda Martinez to provide palliative support, discuss interval history since our last conversation and for ongoing goals of care discussion.  I shared my concern about lack of progress with patient's dysphagia and started the conversation regarding an upcoming decision on temporary core track placement for artificial nutrition.  Miranda Martinez shares that he has discussed this with patient's attending yesterday.  While he feels this procedure is invasive and he does not want her to have the resulting discomfort, he is also very concerned about patient's lack of nutrition since admission.  Given the goal for continued medical management and hope for improvement of her mental status, Miranda Martinez would be agreeable to a temporary NG tube to allow more time for outcomes.  He is at peace with this decision and appreciative of ongoing palliative support.   Questions and concerns addressed. PMT will continue to support holistically.   Length of Stay: 5  Physical Exam Vitals and nursing note reviewed.  Constitutional:      General: She is not in acute distress.    Appearance: She is ill-appearing.     Interventions: Nasal cannula in place.     Comments: 2L   Cardiovascular:     Rate and Rhythm: Normal rate.  Pulmonary:      Effort: Pulmonary effort is normal.  Neurological:     Mental Status: She is lethargic, disoriented and confused.            Vital Signs: BP (!) 149/84 (BP Location: Left Arm)   Pulse 69   Temp 98.7 F (37.1 C) (Axillary)   Resp 16   Wt 57.8 kg Comment: estimated per chart review  SpO2 96%   BMI 21.89 kg/m  SpO2: SpO2: 96 % O2 Device: O2 Device: Room Air O2 Flow Rate: O2 Flow Rate (L/min): 2 L/min      Palliative  Assessment/Data: 20%   Palliative Care Assessment & Plan   Patient Profile: 76 y.o. female  with past medical history of recent diagnosis of high-grade glioma admitted on 07/01/2022 with stroke-like symptoms.    PMT has been consulted to assist with goals of care conversation.  Assessment: Goals of care conversation Recurrent seizures High-grade glioma  Recommendations/Plan: Continue DNR Continue full scope treatment; patient's son is agreeable to NGT for temporary artificial nutrition Continue watchful waiting and time for outcomes, ongoing goals of care discussions pending clinical course Patient's son understands severity of her illness and would transition to comfort care/hospice if she does not improve with meaningful quality of life PMT will continue to follow and support   Prognosis: Poor prognosis given high-grade glioma with acute worsening of symptoms  Discharge Planning: To Be Determined  Care plan was discussed with patient, patient's son   MDM high         Miranda Martinez Johnnette Litter, PA-C  Palliative Medicine Team Team phone # [phone]  Thank you for allowing the Palliative Medicine Team to assist in the care of this patient. Please utilize secure chat with additional questions, if there is no response within 30 minutes please call the above phone number.  Palliative Medicine Team providers are available by phone from 7am to 7pm daily and can be reached through the team cell phone.  Should this patient require assistance outside of these  hours, please call the patient's attending physician.

## 2022-07-07 DIAGNOSIS — R2990 Unspecified symptoms and signs involving the nervous system: Secondary | ICD-10-CM | POA: Diagnosis not present

## 2022-07-07 DIAGNOSIS — C719 Malignant neoplasm of brain, unspecified: Secondary | ICD-10-CM | POA: Diagnosis not present

## 2022-07-07 DIAGNOSIS — T420X1D Poisoning by hydantoin derivatives, accidental (unintentional), subsequent encounter: Secondary | ICD-10-CM | POA: Diagnosis not present

## 2022-07-07 DIAGNOSIS — G9340 Encephalopathy, unspecified: Secondary | ICD-10-CM | POA: Diagnosis not present

## 2022-07-07 DIAGNOSIS — R569 Unspecified convulsions: Secondary | ICD-10-CM | POA: Diagnosis not present

## 2022-07-07 DIAGNOSIS — R471 Dysarthria and anarthria: Secondary | ICD-10-CM | POA: Diagnosis not present

## 2022-07-07 DIAGNOSIS — Z7189 Other specified counseling: Secondary | ICD-10-CM | POA: Diagnosis not present

## 2022-07-07 LAB — CBC WITH DIFFERENTIAL/PLATELET
Abs Immature Granulocytes: 0.05 10*3/uL (ref 0.00–0.07)
Basophils Absolute: 0 10*3/uL (ref 0.0–0.1)
Basophils Relative: 0 %
Eosinophils Absolute: 0 10*3/uL (ref 0.0–0.5)
Eosinophils Relative: 0 %
HCT: 43.2 % (ref 36.0–46.0)
Hemoglobin: 15 g/dL (ref 12.0–15.0)
Immature Granulocytes: 1 %
Lymphocytes Relative: 12 %
Lymphs Abs: 1 10*3/uL (ref 0.7–4.0)
MCH: 33.7 pg (ref 26.0–34.0)
MCHC: 34.7 g/dL (ref 30.0–36.0)
MCV: 97.1 fL (ref 80.0–100.0)
Monocytes Absolute: 0.8 10*3/uL (ref 0.1–1.0)
Monocytes Relative: 9 %
Neutro Abs: 6.6 10*3/uL (ref 1.7–7.7)
Neutrophils Relative %: 78 %
Platelets: 159 10*3/uL (ref 150–400)
RBC: 4.45 MIL/uL (ref 3.87–5.11)
RDW: 12.8 % (ref 11.5–15.5)
WBC: 8.5 10*3/uL (ref 4.0–10.5)
nRBC: 0 % (ref 0.0–0.2)

## 2022-07-07 LAB — BLOOD GAS, VENOUS
Acid-Base Excess: 5 mmol/L — ABNORMAL HIGH (ref 0.0–2.0)
Bicarbonate: 29.1 mmol/L — ABNORMAL HIGH (ref 20.0–28.0)
Drawn by: 164
O2 Saturation: 94.7 %
Patient temperature: 37
pCO2, Ven: 40 mmHg — ABNORMAL LOW (ref 44–60)
pH, Ven: 7.47 — ABNORMAL HIGH (ref 7.25–7.43)
pO2, Ven: 64 mmHg — ABNORMAL HIGH (ref 32–45)

## 2022-07-07 LAB — GLUCOSE, CAPILLARY
Glucose-Capillary: 101 mg/dL — ABNORMAL HIGH (ref 70–99)
Glucose-Capillary: 126 mg/dL — ABNORMAL HIGH (ref 70–99)
Glucose-Capillary: 117 mg/dL — ABNORMAL HIGH (ref 70–99)
Glucose-Capillary: 108 mg/dL — ABNORMAL HIGH (ref 70–99)

## 2022-07-07 LAB — PHENYTOIN LEVEL, TOTAL: Phenytoin Lvl: 15.8 ug/mL (ref 10.0–20.0)

## 2022-07-07 NOTE — Progress Notes (Signed)
Subjective: Again appears more awake than yesterday, closer to the way she was 3 days ago.  Exam: Vitals:   07/07/22 1000 07/07/22 1232  BP: (!) 151/90 (!) 144/79  Pulse: 77 82  Resp: 18 19  Temp: 99.1 F (37.3 C) 98.4 F (36.9 C)  SpO2:  92%   Gen: In bed, NAD Resp: non-labored breathing, no acute distress Abd: soft, nt  Neuro: MS: She is more awake, I am able to get her to follow commands to squeeze my fingers and wiggle her toes on the left, but she does not move the right side to command.  She is severely dysarthric CN: Pupils are reactive bilaterally, right facial decreased nasolabial fold Motor: She moves the left side to command, withdraws on the right Sensory: She does respond to noxious stimulation x4  Pertinent Labs: WBC 8.1 Dilantin 15.6  Impression: 76 year old female with a history of recent brain tumor biopsy who presented with partial status epilepticus.  My suspicion is that her worsening a couple of days ago primarily due to her Dilantin level, rather than physiological worsening.  She seems to be improving at this point.  I have weaned her down to 4 mg every 12 hours on dexamethasone, we can continue this for a while. There was minimal increased conspicuity of the intratumoral hemorrhage on CT 2 days ago, unlikely to be clinically significant.  Recommendations: 1) continue keppra '1500mg'$  BID 2) continue vimpat '200mg'$  BID 3) continue dilantin '75mg'$  TID (decreased from 100 mg 3 times daily on 10/20) 4) decrease dexamethaonse to '4mg'$  q12h today 5) continue EEG 6) neurology will follow  Roland Rack, MD [facility] [phone]  If 7pm- 7am, please page neurology on call as listed in [place].

## 2022-07-07 NOTE — Progress Notes (Signed)
Speech Language Pathology Treatment: Dysphagia  Patient Details Name: Miranda Martinez MRN: [id number] DOB: [birth year] Today's Date: 07/07/2022 Time: 7322-0254 SLP Time Calculation (min) (ACUTE ONLY): 19 min  Assessment / Plan / Recommendation Clinical Impression  SLP followed up to reassess swallow function. Per chart, some improvements in mentation were noted. During SLP interaction, pt would open eyes transiently then quickly close them. She was unable to follow simple commands. Performed diligent oral care, xerostomia noted in setting of NPO status. Following diligent oral care, single ice chip was attempted in anterior oral cavity. Pt without any active bolus manipulation, SLP subsequently suctioned out with yankauer. Mentation continues to be barrier for PO readiness. Per MD discretion and family decision making consider cortrak for short term nutritional support. SLP to continue to follow.      HPI HPI: 76 y.o. female with recent diagnosis of high-grade glioblastoma 06/16/22 who presented to [facility] ED on 07/01/2022 with new onset seizures.      SLP Plan  Continue with current plan of care      Recommendations for follow up therapy are one component of a multi-disciplinary discharge planning process, led by the attending physician.  Recommendations may be updated based on patient status, additional functional criteria and insurance authorization.    Recommendations  Diet recommendations: NPO Medication Administration: Via alternative means                Oral Care Recommendations: Oral care QID Follow Up Recommendations:  (TBD depending progress) SLP Visit Diagnosis: Dysphagia, unspecified (R13.10) Plan: Continue with current plan of care           Hayden Rasmussen MA, CCC-SLP Acute Rehabilitation Services    07/07/2022, 12:51 PM

## 2022-07-07 NOTE — Progress Notes (Signed)
EEG maint complete.  ?

## 2022-07-07 NOTE — Procedures (Signed)
Patient Name: Miranda Martinez  MRN: [id number]  Epilepsy Attending: Lora Havens  Referring Physician/Provider: Judith Part, MD  Duration: 07/06/2022 0600 to 07/07/2022 0600   Patient history: 76 y.o. 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 EEG to evaluate for seizure.    Level of alertness:  lethargic , asleep   AEDs during EEG study: LEV, PHT, LCM   Technical aspects: This EEG study was done with scalp electrodes positioned according to the 10-20 International system of electrode placement. Electrical activity was reviewed with band pass filter of 1-'70Hz'$ , sensitivity of 7 uV/mm, display speed of 67m/sec with a '60Hz'$  notched filter applied as appropriate. EEG data were recorded continuously and digitally stored.  Video monitoring was available and reviewed as appropriate.   Description: During awake state, no clear posterior dominant rhythm was seen. Sleep was characterized by sleep spindles ( 12-'14Hz'$ ), fronto-central region. EEG showed continuous generalized 3 to 6 Hz theta-delta slowing, at times with triphasic morphology. Sharp waves were noted in left hemisphere, maximal left frontal region which appeared quasiperiodic at 0.5 to 1 Hz. Hyperventilation and photic stimulation were not performed.    ABNORMALITY - Sharp waves, left hemisphere, maximal left frontal region - Continuous slow, generalized    IMPRESSION: This study was suggestive of epileptogenicity arising from left hemisphere, maximal left frontal region. Additionally there is moderate diffuse encephalopathy, nonspecific etiology. No seizures were seen during this study.   Blayne Frankie OBarbra Sarks

## 2022-07-07 NOTE — Progress Notes (Signed)
Daily Progress Note   Patient Name: Miranda Martinez       Date: 07/07/2022 DOB: March 12, 1946  Age: 76 y.o. MRN#: [id number] Attending Physician: Modena Jansky, MD Primary Care Physician: [name], DO Admit Date: 07/01/2022  Reason for Consultation/Follow-up: Establishing goals of care  Subjective: Medical records reviewed. Patient assessed at the bedside.  She remains lethargic. No family present during my visit.  Attempted to call patient's son Miranda Martinez for ongoing goals of care discussions and palliative support but was unable to reach.  Voicemail with PMT contact information was provided.  Questions and concerns addressed. PMT will continue to support holistically.   Length of Stay: 6  Physical Exam Vitals and nursing note reviewed.  Constitutional:      General: She is not in acute distress.    Appearance: She is ill-appearing.     Interventions: Nasal cannula in place.     Comments: 2L   Cardiovascular:     Rate and Rhythm: Normal rate.  Pulmonary:     Effort: Pulmonary effort is normal.  Neurological:     Mental Status: She is lethargic, disoriented and confused.            Vital Signs: BP (!) 144/79   Pulse 82   Temp 98.4 F (36.9 C) (Axillary)   Resp 19   Wt 57.8 kg Comment: estimated per chart review  SpO2 92%   BMI 21.89 kg/m  SpO2: SpO2: 92 % O2 Device: O2 Device: Room Air O2 Flow Rate: O2 Flow Rate (L/min): 2 L/min      Palliative Assessment/Data: 10%   Palliative Care Assessment & Plan   Patient Profile: 76 y.o. female  with past medical history of recent diagnosis of high-grade glioma admitted on 07/01/2022 with stroke-like symptoms.    PMT has been consulted to assist with goals of care conversation.  Assessment: Goals of care  conversation Recurrent seizures High-grade glioma  Recommendations/Plan: Continue DNR Continue full scope treatment; unable to reach patient's son today but per our prior conversations he is agreeable to NGT placement Continue watchful waiting and time for outcomes, ongoing goals of care discussions pending clinical course Patient's son understands severity of her illness and would transition to comfort care/hospice if she does not improve with meaningful quality of life PMT will continue to follow and support  Prognosis: Poor prognosis given high-grade glioma with acute worsening of symptoms  Discharge Planning: To Be Determined    MDM high         Enid Maultsby Johnnette Litter, PA-C  Palliative Medicine Team Team phone # [phone]  Thank you for allowing the Palliative Medicine Team to assist in the care of this patient. Please utilize secure chat with additional questions, if there is no response within 30 minutes please call the above phone number.  Palliative Medicine Team providers are available by phone from 7am to 7pm daily and can be reached through the team cell phone.  Should this patient require assistance outside of these hours, please call the patient's attending physician.

## 2022-07-07 NOTE — Progress Notes (Signed)
PROGRESS NOTE   Miranda Martinez  TMH:[id number]    DOB: 1946/07/29    DOA: 07/01/2022  PCP: [name], DO   I have briefly reviewed patients [facility].  Chief Complaint  Patient presents with   Code Stroke    Brief Narrative:  76 year old female with medical history significant for recent diagnosis of high-grade glioma, supposed to follow-up with neuro-oncology Dr. [name] on 10/19, presented to the ED on 07/01/2022 with strokelike symptoms/seizure-like symptoms.  She is staying with her son and had been functioning independently, until morning PTA when she was noted to be "a little fussier" with "word salad" and generalized weakness.  In the ED, word salad, slumping >aphasia, right facial droop, tremor of right eye.  Stroke code was called.  CT head showed stable findings of high-grade glioma and some blood related to recent procedure/biopsy.  Remains in ED due to lack of beds.  Admitted for possible partial status epilepticus due to high-grade glioma.  Neurology and neurosurgery/palliative care medicine consulted.  LTM EEG completed 10/19.  Mental status appears to be waxing and waning, however yesterday more due to supratherapeutic Dilantin.  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  Neurology consultation appreciated.  S/p IV Ativan x1 and fosphenytoin x1 dose on 10/17.  Currently on Decadron 4 Mg every 12 hours IV, IV Dilantin 75 mg every 8 hours, IV Vimpat 200 mg every 12 hours and IV Keppra 1500 mg every 12 hours.  Neurosurgery seen 10/16: Had no specific recommendations other than steroids and AEDs.  LTM EEG completed 10/19: Abnormal (lateralized periodic discharges, left hemisphere.  Continuous slow, generalized and lateralized left hemisphere.  Has  outpatient follow-up with Dr. [name], neurooncology on 10/26.  Mental status had initially improved but then worsened again, most likely due to Dilantin toxicity (corrected serum Dilantin level approximately 25).  Decadron and Dilantin doses were reduced.  Serial Dilantin levels monitored.  Mental status gradually improving but still not safe for oral intake as per SLP evaluation.  Plans for core track placement (unfortunately not done on Sunday and hence will get it tomorrow) and tube feeding.  Corrected serum Dilantin: 18.2.  Acute encephalopathy: Secondary to status epilepticus, postictal phase and polypharmacy.  Continue gentle IV fluids.  Rest as above.  VBG without CO2 retention.  Slowly improving after cutting back on Dilantin dose.  Core track placement for tomorrow.  Acute urinary retention: Bladder scan showed >900 mL, in and out catheterization per protocol.  Emptied 1100 mL this morning.  Likely related to AMS.  Failed to spontaneously void despite 2 attempts of in and out catheterization and now has a vaginal tear and hence Foley catheter placed to remain.  Consider starting Flomax once able to tolerate p.o.  Consider voiding trial in 48 hours and when mental status has improved.  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  Further evaluation pending.  Has follow-up to see Dr. [name], neurooncology on 07/11/2022.  High-dose dexamethasone 10 mg IV every 6 hours normal reduce to 4 mg every 12 hours.  MRI brain 10/19: No acute abnormality identified.  No significant interval change of the known infiltrative high-grade primary neoplasm involving most of the left cerebral hemisphere with mass effect resulting in 9 mm rightward midline shift, similar to prior.  CT head 10/20: Does not  seem to have significant change compared to prior.  Leukocytosis: Likely related to steroids and stress response.  Afebrile.  Blood cultures x2: Negative to date and urine culture: E. coli, MDR.  Although  has many bacteria on urine microscopy, no significant leukocytes, suspect asymptomatic bacteriuria.  However given her AMS, unable to verify symptoms with her.  Thereby will treat as below.  Resolved.  E. coli acute cystitis: IV Zosyn started 10/19 x3 days.  Completed course.  Hyperglycemia: Secondary to steroids.  A1c 5.5.  Could consider monitoring CBGs and SSI.  [place] discussions: Palliative care continue to assist.  They are follow-up 10/21 appreciated, patient interested in temporary feeding tube.  Patient now DNR but continue full scope of treatment.  Son willing to transition to comfort care/hospice if she does not improve with meaningful quality of life.  Body mass index is 21.89 kg/m.   DVT prophylaxis: SCD's Start: 07/01/22 1525     Code Status: DNR:  Family Communication: None at bedside. Disposition:  Status is: Inpatient Remains inpatient appropriate because: Status epilepticus     Consultants:   Neurology Neurosurgery Palliative care medicine  Procedures:     Antimicrobials:   TM EEG   Subjective:  Appears to be even more awake compared to yesterday.  Eyes open, at times tracking activity.  Mouth open and mouth breathing.  No verbal response.  Objective:   Vitals:   07/07/22 0032 07/07/22 0432 07/07/22 1000 07/07/22 1232  BP: (!) 157/95 (!) 156/93 (!) 151/90 (!) 144/79  Pulse: 86 81 77 82  Resp: '20 20 18 19  '$ Temp: (!) 97.5 F (36.4 C)  99.1 F (37.3 C) 98.4 F (36.9 C)  TempSrc: Axillary  Axillary Axillary  SpO2: 95% 96%  92%  Weight:        General exam: Elderly female, moderately built and poorly nourished lying supine in bed.  Does not appear in any distress.  Hooked back up to EEG.  Mouth breathing.  Dry oral mucosa. Respiratory system: Clear to auscultation.  No increased work of breathing. Cardiovascular system: S1 and S2 heard, RRR.  No JVD, murmurs or pedal edema.  Telemetry personally reviewed: Sinus rhythm. Gastrointestinal system:  Abdomen is nondistended, soft and nontender.  Normal bowel sounds heard.  Has indwelling Foley catheter. Central nervous system: More alert today, eyes open, tracking at times.  She had no verbal response and did not follow instructions for me but per neurology, told them her name and follows some simple instructions. Extremities: Right upper extremity flaccidity, 0/5 power during my exam.  Purposeful movements of left upper extremity and left lower extremity noted. Skin: No rashes, lesions or ulcers Psychiatry: Judgement and insight impaired.  Mood & affect cannot be assessed.    Data Reviewed:   I have personally reviewed following labs and imaging studies   CBC: Recent Labs  Lab 07/05/22 0330 07/06/22 0507 07/07/22 0324  WBC 10.3 8.1 8.5  NEUTROABS 9.2* 6.0 6.6  HGB 13.0 14.2 15.0  HCT 38.2 40.8 43.2  MCV 98.2 96.0 97.1  PLT 180 162 161    Basic Metabolic Panel: Recent Labs  Lab 07/01/22 1330 07/01/22 1342 07/02/22 1050 07/03/22 0357 07/04/22 0327 07/05/22 1353  NA 134* 135 133* 137 137 136  K 4.1 4.0 4.0 5.0 3.9 4.0  CL 104 102 102 103 104 94*  CO2 24  --  23 18* 25 25  GLUCOSE 176* 178* 170* 170* 171* 152*  BUN 28* 30* 23 27* 20 17  CREATININE 0.75 0.70 0.60 0.82 0.61 0.59  CALCIUM 8.3*  --  8.9 9.5 8.4* 8.8*  MG  --   --  2.3  --   --   --     Liver Function Tests: Recent Labs  Lab 07/01/22 1330 07/05/22 1422  AST 20  --   ALT 24  --   ALKPHOS 26*  --   BILITOT 1.2  --   PROT 5.9*  --   ALBUMIN 3.3* 2.8*    CBG: Recent Labs  Lab 07/06/22 2153 07/07/22 0642 07/07/22 1350  GLUCAP 124* 101* 126*    Microbiology Studies:   Recent Results (from the past 240 hour(s))  Urine Culture     Status: Abnormal   Collection Time: 07/02/22  7:06 AM   Specimen: Urine, Clean Catch  Result Value Ref Range Status   Specimen Description URINE, CLEAN CATCH  Final   Special Requests   Final    NONE Performed at [facility], [place] [address]  [place], [place] [address]    Culture (A)  Final    >=100,000 COLONIES/mL ESCHERICHIA COLI Confirmed Extended Spectrum Beta-Lactamase Producer (ESBL).  In bloodstream infections from ESBL organisms, carbapenems are preferred over piperacillin/tazobactam. They are shown to have a lower risk of mortality.    Report Status 07/04/2022 FINAL  Final   Organism ID, Bacteria ESCHERICHIA COLI (A)  Final      Susceptibility   Escherichia coli - MIC*    AMPICILLIN >=32 RESISTANT Resistant     CEFAZOLIN >=64 RESISTANT Resistant     CEFEPIME 16 RESISTANT Resistant     CEFTRIAXONE >=64 RESISTANT Resistant     CIPROFLOXACIN 0.5 INTERMEDIATE Intermediate     GENTAMICIN >=16 RESISTANT Resistant     IMIPENEM <=0.25 SENSITIVE Sensitive     NITROFURANTOIN <=16 SENSITIVE Sensitive     TRIMETH/SULFA <=20 SENSITIVE Sensitive     AMPICILLIN/SULBACTAM >=32 RESISTANT Resistant     PIP/TAZO <=4 SENSITIVE Sensitive     * >=100,000 COLONIES/mL ESCHERICHIA COLI  Culture, blood (Routine X 2) w Reflex to ID Panel     Status: None (Preliminary result)   Collection Time: 07/03/22  3:57 AM   Specimen: BLOOD LEFT WRIST  Result Value Ref Range Status   Specimen Description BLOOD LEFT WRIST  Final   Special Requests   Final    BOTTLES DRAWN AEROBIC ONLY Blood Culture results may not be optimal due to an inadequate volume of blood received in culture bottles   Culture   Final    NO GROWTH 4 DAYS Performed at [facility] [address], [place], [place] [address]    Report Status PENDING  Incomplete  Culture, blood (Routine X 2) w Reflex to ID Panel     Status: None (Preliminary result)   Collection Time: 07/03/22  3:50 PM   Specimen: BLOOD  Result Value Ref Range Status   Specimen Description BLOOD BLOOD LEFT HAND  Final   Special Requests   Final    BOTTLES DRAWN AEROBIC AND ANAEROBIC Blood Culture results may not be optimal due to an inadequate volume of blood received in culture bottles   Culture   Final     NO GROWTH 4 DAYS Performed at [facility], [place] [address] [place], [place] [address]    Report Status PENDING  Incomplete    Radiology Studies:  CT HEAD WO CONTRAST (5MM)  Result Date: 07/05/2022 CLINICAL DATA:  Mental status change EXAM: CT  HEAD WITHOUT CONTRAST TECHNIQUE: Contiguous axial images were obtained from the base of the skull through the vertex without intravenous contrast. RADIATION DOSE REDUCTION: This exam was performed according to the departmental dose-optimization program which includes automated exposure control, adjustment of the mA and/or kV according to patient size and/or use of iterative reconstruction technique. COMPARISON:  07/01/2022 CT head, 07/04/2022 MRI head FINDINGS: Brain: Redemonstrated infiltrative tumor in the left cerebral hemisphere, which is better evaluated on the prior MRI. Slightly increased hyperdense hemorrhage associated with the left periatrial hypercellular component (series 5, image 17). 8 mm of left-to-right midline shift, unchanged when remeasured similarly. Unchanged mass effect on the left-greater-than-right lateral ventricle and third ventricle. Again noted is a small amount of hyperdense hemorrhage adjacent to the left parietal burr hole. No evidence of acute infarction, hydrocephalus, or new extra-axial fluid collection. Vascular: No hyperdense vessel. Skull: Left parietal burr hole. Negative for fracture or focal lesion. Sinuses/Orbits: No acute finding. Other: The mastoid air cells are well aerated. IMPRESSION: 1. Redemonstrated infiltrative tumor in the left cerebral hemisphere, which is better evaluated on the prior MRI. Slightly increased hyperdense hemorrhage associated with the left periatrial hypercellular component compared to 07/01/2022. 2. Unchanged 8 mm of left-to-right midline shift. Unchanged mass effect on the left-greater-than-right lateral ventricle and third ventricle. 3. Unchanged small amount of hyperdense hemorrhage  adjacent to the left parietal burr hole. These results will be called to the ordering clinician or representative by the Radiologist Assistant, and communication documented in the PACS or [employer]. Electronically Signed   By: Merilyn Baba M.D.   On: 07/05/2022 15:56    Scheduled Meds:    Chlorhexidine Gluconate Cloth  6 each Topical Daily   dexamethasone (DECADRON) injection  4 mg Intravenous Q12H   insulin aspart  0-9 Units Subcutaneous TID WC   phenytoin (DILANTIN) IV  75 mg Intravenous Q8H    Continuous Infusions:    sodium chloride Stopped (07/04/22 1624)   lacosamide (VIMPAT) IV 200 mg (07/07/22 1156)   levETIRAcetam 1,500 mg (07/07/22 1017)     LOS: 6 days     Vernell Leep, MD,  FACP, FHM, [facility], [facility], [place]     To contact the attending provider between 7A-7P or the covering provider during after hours 7P-7A, please log into the web site [email or web address] and access using universal [place] password for that web site. If you do not have the password, please call the hospital operator.  07/07/2022, 2:27 PM

## 2022-07-08 ENCOUNTER — Other Ambulatory Visit (HOSPITAL_COMMUNITY): Payer: Self-pay

## 2022-07-08 ENCOUNTER — Inpatient Hospital Stay (HOSPITAL_COMMUNITY): Payer: Medicare Other

## 2022-07-08 ENCOUNTER — Telehealth (HOSPITAL_COMMUNITY): Payer: Self-pay | Admitting: Pharmacy Technician

## 2022-07-08 DIAGNOSIS — C719 Malignant neoplasm of brain, unspecified: Secondary | ICD-10-CM | POA: Diagnosis not present

## 2022-07-08 DIAGNOSIS — G9340 Encephalopathy, unspecified: Secondary | ICD-10-CM | POA: Diagnosis not present

## 2022-07-08 DIAGNOSIS — R569 Unspecified convulsions: Secondary | ICD-10-CM | POA: Diagnosis not present

## 2022-07-08 DIAGNOSIS — T420X1D Poisoning by hydantoin derivatives, accidental (unintentional), subsequent encounter: Secondary | ICD-10-CM | POA: Diagnosis not present

## 2022-07-08 LAB — LEVETIRACETAM LEVEL: Levetiracetam Lvl: 35.6 ug/mL (ref 10.0–40.0)

## 2022-07-08 LAB — CULTURE, BLOOD (ROUTINE X 2)
Culture: NO GROWTH
Culture: NO GROWTH

## 2022-07-08 LAB — GLUCOSE, CAPILLARY
Glucose-Capillary: 120 mg/dL — ABNORMAL HIGH (ref 70–99)
Glucose-Capillary: 132 mg/dL — ABNORMAL HIGH (ref 70–99)
Glucose-Capillary: 111 mg/dL — ABNORMAL HIGH (ref 70–99)
Glucose-Capillary: 144 mg/dL — ABNORMAL HIGH (ref 70–99)

## 2022-07-08 LAB — PHOSPHORUS: Phosphorus: 2.4 mg/dL — ABNORMAL LOW (ref 2.5–4.6)

## 2022-07-08 LAB — PHENYTOIN LEVEL, TOTAL: Phenytoin Lvl: 15.2 ug/mL (ref 10.0–20.0)

## 2022-07-08 LAB — MAGNESIUM: Magnesium: 2 mg/dL (ref 1.7–2.4)

## 2022-07-08 MED ORDER — FREE WATER
75.0000 mL | Status: DC
  Administered 2022-07-08 – 2022-07-16 (×46): 75 mL

## 2022-07-08 MED ORDER — PHENYTOIN SODIUM 50 MG/ML IJ SOLN
100.0000 mg | Freq: Two times a day (BID) | INTRAMUSCULAR | Status: DC
  Administered 2022-07-08 – 2022-07-11 (×6): 100 mg via INTRAVENOUS
  Filled 2022-07-08 (×6): qty 2

## 2022-07-08 MED ORDER — THIAMINE MONONITRATE 100 MG PO TABS
100.0000 mg | ORAL_TABLET | Freq: Every day | ORAL | Status: DC
  Administered 2022-07-08 – 2022-07-16 (×9): 100 mg
  Filled 2022-07-08 (×9): qty 1

## 2022-07-08 MED ORDER — JEVITY 1.2 CAL PO LIQD
1000.0000 mL | ORAL | Status: DC
  Administered 2022-07-08 – 2022-07-16 (×7): 1000 mL
  Filled 2022-07-08 (×5): qty 1000

## 2022-07-08 NOTE — Progress Notes (Signed)
Bladder flat and not tender to touch. Output 425 ml. Pt mouthed word No when I asked if her belly hurt when I was palpating it. Dr. Ninetta Lights aware

## 2022-07-08 NOTE — Progress Notes (Signed)
Initial Nutrition Assessment  DOCUMENTATION CODES:   Severe malnutrition in context of chronic illness  INTERVENTION:  Once cortrak confirmed, recommend the following TF regimen: Jevity 1.2 at 55m/h (1.44L/d) Start at 228mand advance by 1021m8h to goal of 69m54mee water flush of 75mL34m This provides 1728kcal, 80g of protein, and 1162mL 101m water (1612mL T53mush) Pt is at risk for refeeding due to recent weight loss and prolonged NPO status. Monitor Mg, K, and phosphorus and replete as needed. Thiamine '100mg'$  x 10 days for refeeding risk  NUTRITION DIAGNOSIS:   Severe Malnutrition related to chronic illness (cancer) as evidenced by percent weight loss, severe muscle depletion (10.9% weight loss x 3 months).  GOAL:   Patient will meet greater than or equal to 90% of their needs  MONITOR:   Diet advancement, Labs, I & O's, TF tolerance  REASON FOR ASSESSMENT:   Consult Enteral/tube feeding initiation and management  ASSESSMENT:   Pt with hx of recently dx glioblastoma presented to ED after showing stroke-like symptoms at home.  10/19 - SLP evaluation, NPO 10/22 - SLP evaluation, NPO  Pt resting in bed at the time of assessment. Sleeping soundly. Did not wake when name was called or while performing physical exam.   Reviewed weight hx in care everywhere, 10.9% weight loss noted over the last 3 months (7/16-10/19) which is severe for this timeframe.  No PO since admission. SLP attempted to work with pt this AM but was unable to advance diet, pt was resistant to SLP even providing oral care. Cortrak tube placed this PM to provide access for medications and nutrition. Pt at high risk for refeeding due to prolonged poor PO and recent weight loss.   Nutritionally Relevant Medications: Scheduled Meds:  insulin aspart  0-9 Units Subcutaneous TID WC   PRN Meds: senna-docusate  Labs Reviewed: Chloride: 94 CBG ranges from 101-126 mg/dL over the last 24 hours  NUTRITION  - FOCUSED PHYSICAL EXAM: Flowsheet Row Most Recent Value  Orbital Region Mild depletion  Upper Arm Region No depletion  Thoracic and Lumbar Region Mild depletion  Buccal Region Mild depletion  Temple Region Mild depletion  Clavicle Bone Region Mild depletion  Clavicle and Acromion Bone Region Moderate depletion  Scapular Bone Region Mild depletion  Dorsal Hand Severe depletion  Patellar Region Severe depletion  Anterior Thigh Region Severe depletion  Posterior Calf Region Severe depletion  Edema (RD Assessment) None  Hair Reviewed  [dry scalp]  Eyes Unable to assess  Mouth Reviewed  Skin Reviewed  Nails Reviewed    Diet Order:   Diet Order             Diet NPO time specified  Diet effective now                   EDUCATION NEEDS:   Not appropriate for education at this time  Skin:  Skin Assessment: Reviewed RN Assessment  Last BM:  10/20  Height:  Ht Readings from Last 1 Encounters:  06/18/22 '5\' 4"'$  (1.626 m)    Weight:  Wt Readings from Last 1 Encounters:  07/04/22 57.8 kg    Ideal Body Weight:     BMI:  Body mass index is 21.89 kg/m.  Estimated Nutritional Needs:  Kcal:  1600-1800 kcal/d Protein:  80-95g/d Fluid:  >/=1.6L/d   Nyree Yonker Ranell PatrickDN Clinical Dietitian RD pager # available in AMION  [facility] hours/weekend pager # available in [facility]

## 2022-07-08 NOTE — Telephone Encounter (Signed)
Pharmacy Patient Advocate Encounter  Insurance verification completed.    The patient is insured through [employer] [facility] [employer] [facility] [employer] Part D   The patient is currently admitted and ran test claims for the following:  lacosamide (Vimpat).  Copays and coinsurance results were relayed to Inpatient clinical team.

## 2022-07-08 NOTE — Procedures (Signed)
Cortrak  Tube Type:  Cortrak - 43 inches Tube Location:  Left nare Initial Placement:  Stomach Secured by: Bridle Technique Used to Measure Tube Placement:  Marking at nare/corner of mouth Cortrak Secured At:  62 cm   Cortrak Tube Team Note:  Consult received to place a Cortrak feeding tube.   X-ray is required, abdominal x-ray has been ordered by the Cortrak team. Please confirm tube placement before using the Cortrak tube.   If the tube becomes dislodged please keep the tube and contact the Cortrak team at [email or web address] for replacement.  If after hours and replacement cannot be delayed, place a NG tube and confirm placement with an abdominal x-ray.    Koleen Distance MS, RD, LDN Please refer to [facility] for RD and/or RD on-call/weekend/after hours pager

## 2022-07-08 NOTE — Progress Notes (Signed)
vLTM maintenance  all impedances below 10kohms  No skin breakdown noted at FP1  FP1  F7  F8

## 2022-07-08 NOTE — Procedures (Signed)
Patient Name: Miranda Martinez  MRN: [id number]  Epilepsy Attending: Lora Havens  Referring Physician/Provider: Judith Part, MD  Duration: 07/08/2022 0600 to 07/08/2022 1110   Patient history: 76 y.o. 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 EEG to evaluate for seizure.    Level of alertness:  lethargic , asleep   AEDs during EEG study: LEV, PHT, LCM   Technical aspects: This EEG study was done with scalp electrodes positioned according to the 10-20 International system of electrode placement. Electrical activity was reviewed with band pass filter of 1-'70Hz'$ , sensitivity of 7 uV/mm, display speed of 53m/sec with a '60Hz'$  notched filter applied as appropriate. EEG data were recorded continuously and digitally stored.  Video monitoring was available and reviewed as appropriate.   Description: During awake state, no clear posterior dominant rhythm was seen. Sleep was characterized by sleep spindles ( 12-'14Hz'$ ), fronto-central region. EEG showed continuous generalized and lateralized left hemisphere 3 to 6 Hz theta-delta slowing, at times with triphasic morphology. Sharp waves were noted in left hemisphere, maximal left frontal region which appeared quasiperiodic at 0.5 to 1 Hz. Hyperventilation and photic stimulation were not performed.    ABNORMALITY - Sharp waves, left hemisphere, maximal left frontal region - Continuous slow, generalized and lateralized left hemisphere   IMPRESSION: This study was suggestive of epileptogenicity and dysfunction arising from left hemisphere, maximal left frontal region. Additionally there is moderate diffuse encephalopathy, nonspecific etiology. No seizures were seen during this study.   Nakoa Ganus OBarbra Sarks

## 2022-07-08 NOTE — Progress Notes (Signed)
Pt has foley r/t urinary retention. IVF at 52. Foley emptied at 2100 of 350 ml. No urine since. Bladder slightly distended and abd very tender to touch. Pt grimaced and teared up when I palpated bladder. Shook head yes when I asked if it hurt. Bladder scanned of 487 and 525. Dr. Ninetta Lights paged.

## 2022-07-08 NOTE — Progress Notes (Signed)
LTM EEG discontinued - no skin breakdown at unhook.   

## 2022-07-08 NOTE — TOC Benefit Eligibility Note (Signed)
Patient Teacher, English as a foreign language completed.    The patient is currently admitted and upon discharge could be taking lacosamide (Vimpat) 200 mg tablets.  The current 30 day co-pay is $167.94 due to a $150.00 deductible.   The patient is insured through [place], [place] Patient Advocate Specialist [place] Patient Advocate Team Direct Number: [phone]  Fax: [phone]

## 2022-07-08 NOTE — Progress Notes (Signed)
Pt has opened her mouth to command every 4 hours with mouth care. Last 2 times pt was chewing on the wet mouth swab.

## 2022-07-08 NOTE — Progress Notes (Signed)
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   Vital signs: Temp: 98 F (36.7 C) (10/23 0751) Temp Source: Axillary (10/23 0751) BP: 136/94 (10/23 0751) Pulse Rate: 77 (10/23 0751)  Labs: Lab Results  Component Value Date/Time   Phenytoin Lvl 15.2 07/08/2022 0446   Lab Results  Component Value Date   PHENYTOIN 15.2 07/08/2022   Estimated Creatinine Clearance: 50.9 mL/min (by C-G formula based on SCr of 0.59 mg/dL).   Medications:  Scheduled:   Chlorhexidine Gluconate Cloth  6 each Topical Daily   dexamethasone (DECADRON) injection  4 mg Intravenous Q12H   insulin aspart  0-9 Units Subcutaneous TID WC   phenytoin (DILANTIN) IV  75 mg Intravenous Q8H   Infusions:   sodium chloride 75 mL/hr at 07/08/22 0839   lacosamide (VIMPAT) IV 200 mg (07/08/22 1033)   levETIRAcetam 1,500 mg (07/08/22 0841)     Assessment: 10/22 Phenytoin level: 15.2 (drawn 6.5 hrs after dose, not true trough) Corrected phenytoin level: 23.03 is slightly supratherapeutic  Seizure activity:  10/19 continues to have epileptiform discharges in left frontal regimen 10/21 suggestive of epileptogenicity arising from left hemisphere, no seizures 10/22  suggestive of epileptogenicity and dysfunction arising from left hemisphere, no seizures Significant potential drug interactions: phenytoin may theoretically  decrease dexamethasone but available documentation is poor  Goals of care:  Total phenytoin level: 10-20 mcg/ml Free phenytoin level: 1-2 mcg/ml  Plan:   Decrease phenytoin to '100mg'$  Q12 hr Stop daily levels; Next phenytoin level due 10/26 Pharmacy will continue to follow regarding obtaining total phenytoin levels and dose adjustments as indicated.  Benetta Spar,  PharmD, BCPS, BCCP Clinical Pharmacist  Please check AMION for all [place] phone numbers After 10:00 PM, call [place] [phone]

## 2022-07-08 NOTE — Progress Notes (Signed)
PROGRESS NOTE   Miranda Martinez  SHF:[id number]    DOB: 02/19/1946    DOA: 07/01/2022  PCP: [name], DO   I have briefly reviewed patients [facility].  Chief Complaint  Patient presents with   Code Stroke    Brief Narrative:  76 year old female with medical history significant for recent diagnosis of high-grade glioma, supposed to follow-up with neuro-oncology Dr. [name] on 10/19, presented to the ED on 07/01/2022 with strokelike symptoms/seizure-like symptoms.  She is staying with her son and had been functioning independently, until morning PTA when she was noted to be "a little fussier" with "word salad" and generalized weakness.  In the ED, word salad, slumping >aphasia, right facial droop, tremor of right eye.  Stroke code was called.  CT head showed stable findings of high-grade glioma and some blood related to recent procedure/biopsy.  Remains in ED due to lack of beds.  Admitted for possible partial status epilepticus due to high-grade glioma.  Neurology and neurosurgery/palliative care medicine consulted.  LTM EEG completed 10/19.  Mental status prior to last week and was waxing and waning, due to supratherapeutic Dilantin, dose adjusted.  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  Neurology consultation appreciated.  S/p IV Ativan x1 and fosphenytoin x1 dose on 10/17.  Currently on Decadron 4 Mg every 12 hours IV, IV Dilantin 75 mg every 8 hours, IV Vimpat 200 mg every 12 hours and IV Keppra 1500 mg every 12 hours.  Neurosurgery seen 10/16: Had no specific recommendations other than steroids and AEDs.  LTM EEG completed 10/19: Abnormal (lateralized periodic discharges, left hemisphere.  Continuous slow, generalized and lateralized left hemisphere.   Has outpatient follow-up with Dr. [name], neurooncology on 10/26-may not be able to keep this appointment.  Mental status had initially improved but then worsened again, most likely due to Dilantin toxicity (corrected serum Dilantin level approximately 25).  Decadron and Dilantin doses were reduced.  Serial Dilantin levels monitored and decreasing.  Corrected serum Dilantin: 18.2 on 10/22.  Today's the most alert that she has looked since admission.  SLP to evaluate and if does not pass swallow eval, core track for tube feeding.  Epileptologist has reduced Dilantin 200 Mg every 12.  LTM EEG discontinued since no seizure activity seen.  Acute encephalopathy: Secondary to status epilepticus, postictal phase and polypharmacy.  Continue gentle IV fluids.  Rest as above.  VBG without CO2 retention.  Slowly improving after cutting back on Dilantin dose.  Improving slowly.  Acute urinary retention: Bladder scan showed >900 mL, in and out catheterization per protocol.  Emptied 1100 mL this morning.  Likely related to AMS.  Failed to spontaneously void despite 2 attempts of in and out catheterization and now has a vaginal tear and hence Foley catheter placed to remain.  Consider starting Flomax once able to tolerate p.o.  Consider voiding trial after further improvement of her mental status hopefully in the next 1 to 2 days.  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  Further evaluation pending.  Has follow-up to see Dr. [name], neurooncology on 07/11/2022.  High-dose dexamethasone 10 mg IV every 6 hours normal reduce to 4 mg every 12 hours.  MRI brain 10/19: No acute abnormality identified.  No significant interval change of the known infiltrative high-grade primary neoplasm involving  most of the left cerebral hemisphere with mass effect resulting in 9 mm rightward midline shift, similar to prior.  CT head 10/20: Does not seem to have significant change compared to prior.  Leukocytosis: Likely related  to steroids and stress response.  Afebrile.  Blood cultures x2: Negative to date and urine culture: E. coli, MDR.  Although has many bacteria on urine microscopy, no significant leukocytes, suspect asymptomatic bacteriuria.  However given her AMS, unable to verify symptoms with her.  Thereby will treat as below.  Resolved.  E. coli acute cystitis: IV Zosyn started 10/19 x3 days.  Completed course.  Hyperglycemia: Secondary to steroids.  A1c 5.5.  Could consider monitoring CBGs and SSI.  [place] discussions: Palliative care continue to assist.  They are follow-up 10/21 appreciated, patient interested in temporary feeding tube.  Patient now DNR but continue full scope of treatment.  Son willing to transition to comfort care/hospice if she does not improve with meaningful quality of life.  Body mass index is 21.89 kg/m.   DVT prophylaxis: SCD's Start: 07/01/22 1525     Code Status: DNR:  Family Communication: None at bedside. Disposition:  Status is: Inpatient Remains inpatient appropriate because: Status epilepticus     Consultants:   Neurology Neurosurgery Palliative care medicine  Procedures:     Antimicrobials:   TM EEG   Subjective:  Most alert today than she has ever been since hospital admission.  Mouth extremely dry.  Attempts to say something but unable to understand.  Not following instructions.  However tracks activity with her eyes.  Objective:   Vitals:   07/08/22 0321 07/08/22 0751 07/08/22 1100 07/08/22 1148  BP: 125/84 (!) 136/94  (!) 150/84  Pulse: 85 77 84 79  Resp: '19 20 18 '$ (!) 24  Temp: 97.6 F (36.4 C) 98 F (36.7 C)  98.2 F (36.8 C)  TempSrc: Axillary Axillary  Axillary  SpO2: 94% 96% 95% 98%  Weight:        General exam: Elderly female, moderately built and poorly nourished lying supine in bed.  Does not appear in any distress.  Mouth extremely dry.  Likely related to mouth breathing. Respiratory system: Clear to auscultation.  No increased  work of breathing. Cardiovascular system: S1 and S2 heard, RRR.  No JVD, murmurs or pedal edema.  Telemetry personally reviewed: Sinus rhythm. Gastrointestinal system: Abdomen is nondistended, soft and nontender.  Normal bowel sounds heard.  Has indwelling Foley catheter. Central nervous system: Alert and tracks activity around with her eyes.  Attempts to say something but unable.  Does not follow instructions.  Spontaneous and purposeful movements of left upper extremity, touching her lips and mouth. Extremities: Right upper extremity flaccidity, 0/5 power during my exam.   Skin: No rashes, lesions or ulcers Psychiatry: Judgement and insight impaired.  Mood & affect cannot be assessed.    Data Reviewed:   I have personally reviewed following labs and imaging studies   CBC: Recent Labs  Lab 07/05/22 0330 07/06/22 0507 07/07/22 0324  WBC 10.3 8.1 8.5  NEUTROABS 9.2* 6.0 6.6  HGB 13.0 14.2 15.0  HCT 38.2 40.8 43.2  MCV 98.2 96.0 97.1  PLT 180 162 381    Basic Metabolic Panel: Recent Labs  Lab 07/01/22 1330 07/01/22 1342 07/02/22 1050 07/03/22 0357 07/04/22 0327 07/05/22 1353  NA 134* 135 133* 137 137 136  K 4.1 4.0 4.0 5.0 3.9 4.0  CL 104 102 102 103 104 94*  CO2 24  --  23  18* 25 25  GLUCOSE 176* 178* 170* 170* 171* 152*  BUN 28* 30* 23 27* 20 17  CREATININE 0.75 0.70 0.60 0.82 0.61 0.59  CALCIUM 8.3*  --  8.9 9.5 8.4* 8.8*  MG  --   --  2.3  --   --   --     Liver Function Tests: Recent Labs  Lab 07/01/22 1330 07/05/22 1422  AST 20  --   ALT 24  --   ALKPHOS 26*  --   BILITOT 1.2  --   PROT 5.9*  --   ALBUMIN 3.3* 2.8*    CBG: Recent Labs  Lab 07/07/22 1813 07/07/22 2102 07/08/22 0606  GLUCAP 117* 108* 120*    Microbiology Studies:   Recent Results (from the past 240 hour(s))  Urine Culture     Status: Abnormal   Collection Time: 07/02/22  7:06 AM   Specimen: Urine, Clean Catch  Result Value Ref Range Status   Specimen Description URINE,  CLEAN CATCH  Final   Special Requests   Final    NONE Performed at [facility], [place] [address], [place], [place] [address]    Culture (A)  Final    >=100,000 COLONIES/mL ESCHERICHIA COLI Confirmed Extended Spectrum Beta-Lactamase Producer (ESBL).  In bloodstream infections from ESBL organisms, carbapenems are preferred over piperacillin/tazobactam. They are shown to have a lower risk of mortality.    Report Status 07/04/2022 FINAL  Final   Organism ID, Bacteria ESCHERICHIA COLI (A)  Final      Susceptibility   Escherichia coli - MIC*    AMPICILLIN >=32 RESISTANT Resistant     CEFAZOLIN >=64 RESISTANT Resistant     CEFEPIME 16 RESISTANT Resistant     CEFTRIAXONE >=64 RESISTANT Resistant     CIPROFLOXACIN 0.5 INTERMEDIATE Intermediate     GENTAMICIN >=16 RESISTANT Resistant     IMIPENEM <=0.25 SENSITIVE Sensitive     NITROFURANTOIN <=16 SENSITIVE Sensitive     TRIMETH/SULFA <=20 SENSITIVE Sensitive     AMPICILLIN/SULBACTAM >=32 RESISTANT Resistant     PIP/TAZO <=4 SENSITIVE Sensitive     * >=100,000 COLONIES/mL ESCHERICHIA COLI  Culture, blood (Routine X 2) w Reflex to ID Panel     Status: None   Collection Time: 07/03/22  3:57 AM   Specimen: BLOOD LEFT WRIST  Result Value Ref Range Status   Specimen Description BLOOD LEFT WRIST  Final   Special Requests   Final    BOTTLES DRAWN AEROBIC ONLY Blood Culture results may not be optimal due to an inadequate volume of blood received in culture bottles   Culture   Final    NO GROWTH 5 DAYS Performed at [facility], [address] [place], [place] [address]    Report Status 07/08/2022 FINAL  Final  Culture, blood (Routine X 2) w Reflex to ID Panel     Status: None   Collection Time: 07/03/22  3:50 PM   Specimen: BLOOD  Result Value Ref Range Status   Specimen Description BLOOD BLOOD LEFT HAND  Final   Special Requests   Final    BOTTLES DRAWN AEROBIC AND ANAEROBIC Blood Culture results may not be optimal due to an  inadequate volume of blood received in culture bottles   Culture   Final    NO GROWTH 5 DAYS Performed at [facility], [place] [address] [place], [place] [address]    Report Status 07/08/2022 FINAL  Final    Radiology Studies:  No results found.  Scheduled Meds:    Chlorhexidine Gluconate Cloth  6 each Topical Daily   dexamethasone (DECADRON) injection  4 mg Intravenous Q12H   insulin aspart  0-9 Units Subcutaneous TID WC   phenytoin (DILANTIN) IV  100 mg Intravenous Q12H    Continuous Infusions:    sodium chloride 75 mL/hr at 07/08/22 0839   lacosamide (VIMPAT) IV 200 mg (07/08/22 1033)   levETIRAcetam 1,500 mg (07/08/22 0841)     LOS: 7 days     Vernell Leep, MD,  FACP, FHM, [facility], [facility], [place]     To contact the attending provider between 7A-7P or the covering provider during after hours 7P-7A, please log into the web site [email or web address] and access using universal [place] password for that web site. If you do not have the password, please call the hospital operator.  07/08/2022, 11:54 AM

## 2022-07-08 NOTE — Evaluation (Signed)
Speech Language Pathology Evaluation Patient Details Name: Miranda Martinez MRN: [id number] DOB: 10/18/1945 Today's Date: 07/08/2022 Time: 6546-5035 SLP Time Calculation (min) (ACUTE ONLY): 24 min  Problem List:  Patient Active Problem List   Diagnosis Date Noted   Glioblastoma multiforme of brain ([place]) 07/01/2022   Stroke-like symptoms 07/01/2022   Brain tumor ([place]) 06/16/2022   Past Medical History:  Past Medical History:  Diagnosis Date   Glioma of brain [facility])    Past Surgical History:  Past Surgical History:  Procedure Laterality Date   APPLICATION OF CRANIAL NAVIGATION Left 06/18/2022   Procedure: APPLICATION OF CRANIAL NAVIGATION;  Surgeon: Judith Part, MD;  Location: [place];  Service: Neurosurgery;  Laterality: Left;   PR DURAL GRAFT REPAIR,SPINE DEFECT Left 06/18/2022   Procedure: FRAMELESS STEREOTACTIC BIOPSY WITH STEALTH;  Surgeon: Judith Part, MD;  Location: [place];  Service: Neurosurgery;  Laterality: Left;   HPI:  76 y.o. female with recent diagnosis of high-grade glioblastoma 06/16/22 who presented to [facility] ED on 07/01/2022 with new onset seizures;ST f/u for SLE, PO trials/dysphagia tx.   Assessment / Plan / Recommendation Clinical Impression  Pt seen for partial speech/language assessment with impaired mentation impacting overall performance. Pt presenting with R facial weakness during limited OME with inability to perform oral commands past 1-step with max multimodal cues provided yielding 25% accuracy.  Verbal responses with decreased intelligibility d/t low vocal intensity limited to one-word responses including "no", "okay" and stating name with encouragement/mod verbal cues.  Pt also stated "thank you" at completion of assessment indicating intermittent automatic responses intact.  Pt presenting with expressive/receptive aphasia, but improvement with mentation as pt was responding with appropriate non-verbal communication (ie: facial expressions) and eye  contact more readily this session. ST will continue to f/u in acute setting for functional communication/global aphasia needs.  Thank you for this consult.     SLP Assessment  SLP Recommendation/Assessment: Patient needs continued Speech Language Pathology Services SLP Visit Diagnosis: Attention and concentration deficit;Cognitive communication deficit (R41.841);Aphasia (R47.01)    Recommendations for follow up therapy are one component of a multi-disciplinary discharge planning process, led by the attending physician.  Recommendations may be updated based on patient status, additional functional criteria and insurance authorization.    Follow Up Recommendations  Other (comment) (TBD)    Assistance Recommended at Discharge  Frequent or constant Supervision/Assistance  Functional Status Assessment Patient has had a recent decline in their functional status and demonstrates the ability to make significant improvements in function in a reasonable and predictable amount of time.  Frequency and Duration min 2x/week  1 week      SLP Evaluation Cognition  Overall Cognitive Status: No family/caregiver present to determine baseline cognitive functioning Arousal/Alertness: Awake/alert Orientation Level: Oriented to person Attention: Sustained Sustained Attention: Impaired Awareness: Impaired Behaviors: Restless       Comprehension  Auditory Comprehension Overall Auditory Comprehension: Impaired Yes/No Questions: Impaired Basic Biographical Questions: 0-25% accurate Basic Immediate Environment Questions: 0-24% accurate Commands: Impaired One Step Basic Commands: 25-49% accurate Two Step Basic Commands: 0-24% accurate Visual Recognition/Discrimination Discrimination: Not tested Reading Comprehension Reading Status: Not tested    Expression Expression Primary Mode of Expression: Other (comment) (min verbal/facial expressions) Verbal Expression Overall Verbal Expression:  Impaired Level of Generative/Spontaneous Verbalization: Word Naming: Not tested Confrontation: Not tested Written Expression Written Expression: Not tested   Oral / Motor  Oral Motor/Sensory Function Overall Oral Motor/Sensory Function: Mild impairment Facial ROM: Reduced right Facial Symmetry: Abnormal symmetry right Facial Strength:  (  UTA) Facial Sensation: Other (Comment) (UTA) Lingual ROM:  (UTA) Lingual Symmetry: Other (Comment) (UTA) Lingual Strength: Reduced Motor Speech Respiration: Within functional limits Phonation: Low vocal intensity Resonance: Hypernasality Articulation: Impaired Level of Impairment: Word Intelligibility: Intelligibility reduced Word: 50-74% accurate Phrase: Not tested Sentence: Not tested Conversation: Not tested Motor Planning: Not tested Motor Speech Errors: Not applicable            Pat Zalan Shidler,M.S., CCC-SLP 07/08/2022, 12:10 PM

## 2022-07-08 NOTE — Procedures (Signed)
Patient Name: Miranda Martinez  MRN: [id number]  Epilepsy Attending: Lora Havens  Referring Physician/Provider: Judith Part, MD  Duration: 07/07/2022 0600 to 07/08/2022 0600   Patient history: 76 y.o. 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 EEG to evaluate for seizure.    Level of alertness:  lethargic , asleep   AEDs during EEG study: LEV, PHT, LCM   Technical aspects: This EEG study was done with scalp electrodes positioned according to the 10-20 International system of electrode placement. Electrical activity was reviewed with band pass filter of 1-'70Hz'$ , sensitivity of 7 uV/mm, display speed of 35m/sec with a '60Hz'$  notched filter applied as appropriate. EEG data were recorded continuously and digitally stored.  Video monitoring was available and reviewed as appropriate.   Description: During awake state, no clear posterior dominant rhythm was seen. Sleep was characterized by sleep spindles ( 12-'14Hz'$ ), fronto-central region. EEG showed continuous generalized and lateralized left hemisphere 3 to 6 Hz theta-delta slowing, at times with triphasic morphology. Sharp waves were noted in left hemisphere, maximal left frontal region which appeared quasiperiodic at 0.5 to 1 Hz. Hyperventilation and photic stimulation were not performed.    ABNORMALITY - Sharp waves, left hemisphere, maximal left frontal region - Continuous slow, generalized and lateralized left hemisphere   IMPRESSION: This study was suggestive of epileptogenicity and dysfunction arising from left hemisphere, maximal left frontal region. Additionally there is moderate diffuse encephalopathy, nonspecific etiology. No seizures were seen during this study.   Jaion Lagrange OBarbra Sarks

## 2022-07-08 NOTE — Progress Notes (Addendum)
Subjective: No clinical seizures overnight.  ROS: Unable to obtain due to poor mental status  Examination  Vital signs in last 24 hours: Temp:  [97.6 F (36.4 C)-98.9 F (37.2 C)] 98 F (36.7 C) (10/23 0751) Pulse Rate:  [77-94] 77 (10/23 0751) Resp:  [16-20] 20 (10/23 0751) BP: (125-144)/(79-94) 136/94 (10/23 0751) SpO2:  [92 %-97 %] 96 % (10/23 0751)  General: lying in bed, NAD Neuro: Awake, does look at examiner and attempted to stick out her tongue but did not follow other commands, did not tell me her name or answer any other orientation questions, pupils did appear reactive and no forced gaze deviation was noted, no apparent facial asymmetry was noted, spontaneously moving left upper extremity and left lower extremity antigravity, did withdraw to noxious stimuli in right lower extremity, did not move right upper extremity with noxious stimulation  Basic Metabolic Panel: Recent Labs  Lab 07/01/22 1330 07/01/22 1342 07/02/22 1050 07/03/22 0357 07/04/22 0327 07/05/22 1353  NA 134* 135 133* 137 137 136  K 4.1 4.0 4.0 5.0 3.9 4.0  CL 104 102 102 103 104 94*  CO2 24  --  23 18* 25 25  GLUCOSE 176* 178* 170* 170* 171* 152*  BUN 28* 30* 23 27* 20 17  CREATININE 0.75 0.70 0.60 0.82 0.61 0.59  CALCIUM 8.3*  --  8.9 9.5 8.4* 8.8*  MG  --   --  2.3  --   --   --    CBC: Recent Labs  Lab 07/03/22 1550 07/04/22 0327 07/05/22 0330 07/06/22 0507 07/07/22 0324  WBC 17.7* 14.7* 10.3 8.1 8.5  NEUTROABS 15.5* 12.8* 9.2* 6.0 6.6  HGB 15.3* 13.5 13.0 14.2 15.0  HCT 44.4 38.6 38.2 40.8 43.2  MCV 97.8 96.5 98.2 96.0 97.1  PLT 204 183 180 162 159   Coagulation Studies: No results for input(s): "LABPROT", "INR" in the last 72 hours.  Imaging No new brain imaging overnight  ASSESSMENT AND PLAN: 76 year old female with recent diagnosis of high-grade neoplasm in left temporal region who presented with new onset seizures and eventually went into nonconvulsive status epilepticus  which has since resolved.   New onset epilepsy High-grade left temporal neoplasm Acute encephalopathy, multifactorial -Seizures improved after phenytoin load yesterday but continues to have epileptiform discharges in left frontal region -Encephalopathy likely secondary to medications, postictal state  Recommendations -Continue current AEDs: Keppra 1500 mg twice daily, Vimpat 200 mg twice daily -Dilantin was supratherapeutic.  Will reduce to 100 mg twice daily  -Discontinue LTM EEG as no further seizures overnight -Has follow-up scheduled with Dr. [name] however may not be discharged by 1026.  Message for Dr. [name] to see if it is possible for him to come see her while she in the hospital -PT/OT -Seizure precautions   I have spent a total of  36 minutes with the patient reviewing hospital notes,  test results, labs and examining the patient as well as establishing an assessment and plan.  > 50% of time was spent in direct patient care.  Zeb Comfort Epilepsy [facility] For questions after 5pm please refer to AMION to reach the Neurologist on call

## 2022-07-08 NOTE — Progress Notes (Addendum)
Speech Language Pathology Treatment: Dysphagia  Patient Details Name: Miranda Martinez MRN: [id number] DOB: 1946-06-25 Today's Date: 07/08/2022 Time: 0131-4388 SLP Time Calculation (min) (ACUTE ONLY): 24 min  Assessment / Plan / Recommendation Clinical Impression  Pt seen for dysphagia f/u skilled treatment with intake of ice chips, tsp(1/3) thin liquid with max multimodal cues provided d/t oral holding, decreased oral movement despite cueing and eventual anterior loss of ice chip.  Pt with spontaneous swallow prior to 1/3 tsp amount of thin liquid with multiple swallows initiated post swallow.  OME revealed R facial weakness with limited assessment of lingual ROM d/t pt unable to consistently follow oral commands.  Pt with severe xerostomia with cracked lips/coated white tongue when oral care completed with pt refusing portion of oral care and closing mouth/grimacing when swab placed on tongue; therefore, limited oral care completed.  Pt stated "no" when further care attempted.  Pt also refused by turning head when puree and further tsp amounts of water offered.  Limited assessment completed, so NPO status should continue with ST f/u for potential for diet progression as pt mentation continues to improve overall.     HPI HPI: 76 y.o. 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      SLP Plan  Continue with current plan of care      Recommendations for follow up therapy are one component of a multi-disciplinary discharge planning process, led by the attending physician.  Recommendations may be updated based on patient status, additional functional criteria and insurance authorization.    Recommendations  Diet recommendations: NPO Medication Administration: Via alternative means                Oral Care Recommendations: Oral care QID Follow Up Recommendations: Other  (comment) (TBD) SLP Visit Diagnosis: Dysphagia, unspecified (R13.10) Plan: Continue with current plan of care           Pat Brodee Mauritz,M.S., CCC-SLP  07/08/2022, 11:52 AM

## 2022-07-09 DIAGNOSIS — E43 Unspecified severe protein-calorie malnutrition: Secondary | ICD-10-CM | POA: Insufficient documentation

## 2022-07-09 DIAGNOSIS — T420X1D Poisoning by hydantoin derivatives, accidental (unintentional), subsequent encounter: Secondary | ICD-10-CM | POA: Diagnosis not present

## 2022-07-09 DIAGNOSIS — C719 Malignant neoplasm of brain, unspecified: Secondary | ICD-10-CM | POA: Diagnosis not present

## 2022-07-09 DIAGNOSIS — G9340 Encephalopathy, unspecified: Secondary | ICD-10-CM | POA: Diagnosis not present

## 2022-07-09 LAB — MAGNESIUM
Magnesium: 2 mg/dL (ref 1.7–2.4)
Magnesium: 1.7 mg/dL (ref 1.7–2.4)

## 2022-07-09 LAB — COMPREHENSIVE METABOLIC PANEL
ALT: 88 U/L — ABNORMAL HIGH (ref 0–44)
AST: 58 U/L — ABNORMAL HIGH (ref 15–41)
Albumin: 2.6 g/dL — ABNORMAL LOW (ref 3.5–5.0)
Alkaline Phosphatase: 67 U/L (ref 38–126)
Anion gap: 8 (ref 5–15)
BUN: 17 mg/dL (ref 8–23)
CO2: 28 mmol/L (ref 22–32)
Calcium: 8.3 mg/dL — ABNORMAL LOW (ref 8.9–10.3)
Chloride: 104 mmol/L (ref 98–111)
Creatinine, Ser: 0.64 mg/dL (ref 0.44–1.00)
GFR, Estimated: >60 mL/min (ref >60)
Glucose, Bld: 166 mg/dL — ABNORMAL HIGH (ref 70–99)
Potassium: 3.6 mmol/L (ref 3.5–5.1)
Sodium: 140 mmol/L (ref 135–145)
Total Bilirubin: 0.8 mg/dL (ref 0.3–1.2)
Total Protein: 5.4 g/dL — ABNORMAL LOW (ref 6.5–8.1)

## 2022-07-09 LAB — GLUCOSE, CAPILLARY
Glucose-Capillary: 140 mg/dL — ABNORMAL HIGH (ref 70–99)
Glucose-Capillary: 163 mg/dL — ABNORMAL HIGH (ref 70–99)
Glucose-Capillary: 163 mg/dL — ABNORMAL HIGH (ref 70–99)
Glucose-Capillary: 171 mg/dL — ABNORMAL HIGH (ref 70–99)
Glucose-Capillary: 182 mg/dL — ABNORMAL HIGH (ref 70–99)
Glucose-Capillary: 188 mg/dL — ABNORMAL HIGH (ref 70–99)

## 2022-07-09 LAB — CBC
HCT: 45.3 % (ref 36.0–46.0)
Hemoglobin: 15 g/dL (ref 12.0–15.0)
MCH: 32.9 pg (ref 26.0–34.0)
MCHC: 33.1 g/dL (ref 30.0–36.0)
MCV: 99.3 fL (ref 80.0–100.0)
Platelets: 121 10*3/uL — ABNORMAL LOW (ref 150–400)
RBC: 4.56 MIL/uL (ref 3.87–5.11)
RDW: 12.8 % (ref 11.5–15.5)
WBC: 6.8 10*3/uL (ref 4.0–10.5)
nRBC: 0 % (ref 0.0–0.2)

## 2022-07-09 LAB — PHOSPHORUS
Phosphorus: 2.1 mg/dL — ABNORMAL LOW (ref 2.5–4.6)
Phosphorus: 2.1 mg/dL — ABNORMAL LOW (ref 2.5–4.6)

## 2022-07-09 NOTE — Progress Notes (Addendum)
Subjective: No clinical seizures overnight.  Did open her eyes to repeated tactile stimulation but did not communicate.  ROS: Unable to obtain due to poor mental status  Examination  Vital signs in last 24 hours: Temp:  [98 F (36.7 C)-98.8 F (37.1 C)] 98 F (36.7 C) (10/24 1121) Pulse Rate:  [89-98] 98 (10/23 1809) Resp:  [18-20] 20 (10/24 1121) BP: (124-143)/(76-95) 134/84 (10/24 1121) SpO2:  [93 %-100 %] 100 % (10/24 0721)    General: lying in bed, NAD Neuro: Opens eyes after repeated tactile stimulation, did not follow commands, did not answer orientation questions, pupils did appear reactive and no forced gaze deviation was noted.  However did have right gaze preference and did not track examiner in the room, no apparent facial asymmetry was noted, spontaneously moving left upper extremity and left lower extremity antigravity, did withdraw to noxious stimuli in right lower extremity, did not move right upper extremity with noxious stimulation  Basic Metabolic Panel: Recent Labs  Lab 07/03/22 0357 07/04/22 0327 07/05/22 1353 07/08/22 1713 07/09/22 0646  NA 137 137 136  --  140  K 5.0 3.9 4.0  --  3.6  CL 103 104 94*  --  104  CO2 18* 25 25  --  28  GLUCOSE 170* 171* 152*  --  166*  BUN 27* 20 17  --  17  CREATININE 0.82 0.61 0.59  --  0.64  CALCIUM 9.5 8.4* 8.8*  --  8.3*  MG  --   --   --  2.0 2.0  PHOS  --   --   --  2.4* 2.1*    CBC: Recent Labs  Lab 07/03/22 1550 07/04/22 0327 07/05/22 0330 07/06/22 0507 07/07/22 0324 07/09/22 0646  WBC 17.7* 14.7* 10.3 8.1 8.5 6.8  NEUTROABS 15.5* 12.8* 9.2* 6.0 6.6  --   HGB 15.3* 13.5 13.0 14.2 15.0 15.0  HCT 44.4 38.6 38.2 40.8 43.2 45.3  MCV 97.8 96.5 98.2 96.0 97.1 99.3  PLT 204 183 180 162 159 121*     Coagulation Studies: No results for input(s): "LABPROT", "INR" in the last 72 hours.  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   New onset epilepsy High-grade left temporal neoplasm Acute encephalopathy, multifactorial -Seizures improved after phenytoin load yesterday but continues to have epileptiform discharges in left frontal region -Encephalopathy likely secondary to medications, postictal state   Recommendations -Continue current AEDs: Keppra 1500 mg twice daily, Vimpat 200 mg twice daily, Dilantin 100 mg twice daily -Can consider reducing Keppra to 1000 mg twice daily if patient remains seizure-free and is still lethargic and minimize sedation by end of week.  -Appreciate pharmacy assistance with Dilantin levels and dosing - Rescue medication: Recommend intranasal Valtoco 7.'5mg'$  in each nostril for sz lasting over 5 minutes.  If needed, second dose may be given at least 4 hours after initial dose.  Please do not use more than 2 doses to treat a single episode. -If Valtoco is not covered by insurance, can prescribe clonazepam 0.5 mg oral dissolving tablet for seizure lasting over 5 minutes -Per Dr. [name], patient will not be a candidate for further interventions (chemo radiation) unless her physical and mental status improves.   -Will address goals of care again by end of week if patient does not have  significant improvement in mental status. -PT/OT -Seizure precautions -As needed IV Ativan 2 mg for clinical seizures -Neurology will follow peripherally.  Please call us for any further questions   Seizure precautions: Per [facility] statutes, patients with seizures are not allowed to drive until they have been seizure-free for six months and cleared by a physician    Use caution when using heavy equipment or power tools. Avoid working on ladders or at heights. Take showers instead of baths. Ensure the water temperature is not too high on the home water  heater. Do not go swimming alone. Do not lock yourself in a room alone (i.e. bathroom). When caring for infants or small children, sit down when holding, feeding, or changing them to minimize risk of injury to the child in the event you have a seizure. Maintain good sleep hygiene. Avoid alcohol.    If patient has another seizure, call 911 and bring them back to the ED if: A.  The seizure lasts longer than 5 minutes.      B.  The patient doesn't wake shortly after the seizure or has new problems such as difficulty seeing, speaking or moving following the seizure C.  The patient was injured during the seizure D.  The patient has a temperature over 102 F (39C) E.  The patient vomited during the seizure and now is having trouble breathing    During the Seizure   - First, ensure adequate ventilation and place patients on the floor on their left side  Loosen clothing around the neck and ensure the airway is patent. If the patient is clenching the teeth, do not force the mouth open with any object as this can cause severe damage - Remove all items from the surrounding that can be hazardous. The patient may be oblivious to what's happening and may not even know what he or she is doing. If the patient is confused and wandering, either gently guide him/her away and block access to outside areas - Reassure the individual and be comforting - Call 911. In most cases, the seizure ends before EMS arrives. However, there are cases when seizures may last over 3 to 5 minutes. Or the individual may have developed breathing difficulties or severe injuries. If a pregnant patient or a person with diabetes develops a seizure, it is prudent to call an ambulance. - Finally, if the patient does not regain full consciousness, then call EMS. Most patients will remain confused for about 45 to 90 minutes after a seizure, so you must use judgment in calling for help.    After the Seizure (Postictal Stage)   After a seizure,  most patients experience confusion, fatigue, muscle pain and/or a headache. Thus, one should permit the individual to sleep. For the next few days, reassurance is essential. Being calm and helping reorient the person is also of importance.   Most seizures are painless and end spontaneously. Seizures are not harmful to others but can lead to complications such as stress on the lungs, brain and the heart. Individuals with prior lung problems may develop labored breathing and respiratory distress.    I have spent a total of  36 minutes with the patient reviewing hospital notes,  test results, labs and examining the patient as well as establishing an assessment and plan.  > 50% of time was spent in direct patient care.    Zeb Comfort Epilepsy [facility] For questions after 5pm please refer to AMION to reach the Neurologist on  call

## 2022-07-09 NOTE — TOC Progression Note (Signed)
Transition of Care [facility]) - Progression Note    Patient Details  Name: Miranda Martinez MRN: [id number] Date of Birth: 04-Jan-1946  [facility] CM/SW Contact  Pollie Friar, RN Phone Number: 07/09/2022, 1:46 PM  Clinical Narrative:    Pt with cortrak. Palliative care and TOC following.   Expected Discharge Plan: [place] Barriers to Discharge: Continued Medical Work up  Expected Discharge Plan and Services Expected Discharge Plan: [place] In-house Referral: Clinical Social Work Discharge Planning Services: CM Consult                                           Social Determinants of Health (SDOH) Interventions    Readmission Risk Interventions     No data to display

## 2022-07-09 NOTE — Progress Notes (Addendum)
PROGRESS NOTE   Miranda Martinez  DPO:[id number]    DOB: 06/26/1946    DOA: 07/01/2022  PCP: [name], DO   I have briefly reviewed patients previous medical records in [facility].  Chief Complaint  Patient presents with   Code Stroke    Brief Narrative:  76 year old female with medical history significant for recent diagnosis of high-grade glioma, supposed to follow-up with neuro-oncology Dr. [name] on 10/19, presented to the ED on 07/01/2022 with strokelike symptoms/seizure-like symptoms.  She is staying with her son and had been functioning independently, until morning PTA when she was noted to be "a little fussier" with "word salad" and generalized weakness.  In the ED, word salad, slumping >aphasia, right facial droop, tremor of right eye.  Stroke code was called.  CT head showed stable findings of high-grade glioma and some blood related to recent procedure/biopsy.  Remains in ED due to lack of beds.  Admitted for possible partial status epilepticus due to high-grade glioma.  Neurology and neurosurgery/palliative care medicine consulted.  LTM EEG completed 10/19.  Mental status prior to last week and was waxing and waning, due to supratherapeutic Dilantin, dose adjusted.  Mental status slowly improved but seems to have plateaued in the last 48 hours.   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  Neurology consultation appreciated.  S/p IV Ativan x1 and fosphenytoin x1 dose on 10/17.  Currently on Decadron 4 Mg every 12 hours IV, IV Dilantin 100 mg every 12 hours, IV Vimpat 200 mg every 12 hours and IV Keppra 1500 mg every 12 hours.  Neurosurgery seen 10/16: Had no specific recommendations other than steroids and AEDs.  LTM EEG completed 10/19: Abnormal (lateralized periodic  discharges, left hemisphere.  Continuous slow, generalized and lateralized left hemisphere.  Has outpatient follow-up with Dr. [name], Neurooncology on 10/26-will not be able to keep this appointment.  Mental status had initially improved but then worsened again, most likely due to Dilantin toxicity (corrected serum Dilantin level approximately 25).  Decadron and Dilantin doses were reduced.  Serial Dilantin levels monitored and decreasing.  Corrected serum Dilantin: 18.2 on 10/22.  Unsafe for oral intake, core track placed and tube feeding ongoing.  Has been off LTM EEG.  Although mental status had slightly improved, this is again plateaued in the last 48 hours.  As per epileptologist, if mental status does not improve and she remains seizure-free as this week progresses, could consider reducing Keppra to 1 g twice daily and readdress goals of care with family.  They have already been closely updated through the course of her hospitalization.  Neurology plans to follow peripherally, need to be called for any further questions.  Acute encephalopathy: Secondary to status epilepticus, postictal phase and polypharmacy.  Continue gentle IV fluids.  Rest as above.  VBG without CO2 retention.  See discussion above.  Acute urinary retention: Bladder scan showed >900 mL, in and out catheterization per protocol.  Emptied 1100 mL this morning.  Likely related to AMS.  Failed to spontaneously void despite 2 attempts of in and out catheterization and now has a vaginal tear and hence Foley catheter placed to remain.  Start Flomax via feeding tube.  Discontinue Foley and monitor closely for voiding and as needed bladder scans.  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  Further evaluation pending.  Has follow-up to see Dr. [name], neurooncology on 07/11/2022.  High-dose dexamethasone 10 mg IV every 6 hours normal reduce to 4 mg every 12 hours.  MRI brain 10/19: No acute abnormality identified.  No significant  interval change of the known infiltrative high-grade primary neoplasm involving most of the left cerebral hemisphere with mass effect resulting in 9 mm rightward midline shift, similar to prior.  CT head 10/20: Does not seem to have significant change compared to prior.  Also as per Dr. Karolee Stamps communication with Dr. [name], patient will not be a candidate for further interventions i.e. chemo radiation unless her physical and mental status improves.  Leukocytosis: Likely related to steroids and stress response.  Afebrile.  Blood cultures x2: Negative.  Urine culture: E. coli, MDR.  Completed treatment for UTI.  Resolved.  E. coli acute cystitis: Completed treatment with 3 days of IV Zosyn.  Hyperglycemia: Secondary to steroids.  A1c 5.5.  Despite tube feeds, CBGs well controlled without need for SSI, DC CBGs.  Mild transaminitis: Possibly due to tube feeds.  Follow CMP.  Also mild thrombocytopenia  GOC discussions: Palliative care continue to assist.  They are follow-up 10/21 appreciated, patient interested in temporary feeding tube.  Patient now DNR but continue full scope of treatment.  Son willing to transition to comfort care/hospice if she does not improve with meaningful quality of life.  Please see discussion above.  Body mass index is 21.89 kg/m.   DVT prophylaxis: SCD's Start: 07/01/22 1525     Code Status: DNR:  Family Communication: [place] son via phone. Disposition:  Status is: Inpatient Remains inpatient appropriate because: Ongoing mental status changes, dysphagia, tube feeds, IV meds     Consultants:   Neurology Neurosurgery Palliative care medicine  Procedures:     Antimicrobials:   TM EEG   Subjective:  Seen patient while nursing were giving her a bath.  Eyes briefly open.  Groans during position change but otherwise really nonverbal and does not follow instructions.  Objective:   Vitals:   07/09/22 0452 07/09/22 0721 07/09/22 1121 07/09/22 1521  BP:  138/82 135/81 134/84 129/65  Pulse:      Resp: '19 18 20 20  '$ Temp: 98.1 F (36.7 C) 98.1 F (36.7 C) 98 F (36.7 C)   TempSrc: Axillary  Oral   SpO2: 99% 100%    Weight:        General exam: Elderly female, moderately built and poorly nourished lying supine in bed.  Nursing staff for bathing the patient.  No distress.  Chronically ill looking. Respiratory system: Clear to auscultation.  No increased work of breathing. Cardiovascular system: S1 and S2 heard, RRR.  No JVD, murmurs or pedal edema.  Telemetry personally reviewed: Sinus rhythm.  As discussed with epileptologist, discontinued telemetry. Gastrointestinal system: Abdomen is nondistended, soft and nontender.  Normal bowel sounds heard.  Has indwelling Foley catheter. Central nervous system: Status as noted above, may be even somewhat worse than yesterday. Extremities: Right upper extremity flaccidity, 0/5 power during my exam.   Skin: No rashes, lesions or ulcers Psychiatry: Judgement and insight impaired.  Mood & affect cannot be assessed.    Data Reviewed:   I have personally reviewed following labs and imaging studies   CBC: Recent Labs  Lab 07/05/22 0330 07/06/22 0507 07/07/22 0324 07/09/22 0646  WBC 10.3 8.1 8.5 6.8  NEUTROABS 9.2* 6.0 6.6  --   HGB 13.0 14.2 15.0 15.0  HCT 38.2 40.8 43.2 45.3  MCV 98.2 96.0  97.1 99.3  PLT 180 162 159 121*    Basic Metabolic Panel: Recent Labs  Lab 07/03/22 0357 07/04/22 0327 07/05/22 1353 07/08/22 1713 07/09/22 0646  NA 137 137 136  --  140  K 5.0 3.9 4.0  --  3.6  CL 103 104 94*  --  104  CO2 18* 25 25  --  28  GLUCOSE 170* 171* 152*  --  166*  BUN 27* 20 17  --  17  CREATININE 0.82 0.61 0.59  --  0.64  CALCIUM 9.5 8.4* 8.8*  --  8.3*  MG  --   --   --  2.0 2.0  PHOS  --   --   --  2.4* 2.1*    Liver Function Tests: Recent Labs  Lab 07/05/22 1422 07/09/22 0646  AST  --  58*  ALT  --  88*  ALKPHOS  --  67  BILITOT  --  0.8  PROT  --  5.4*  ALBUMIN  2.8* 2.6*    CBG: Recent Labs  Lab 07/09/22 0400 07/09/22 0755 07/09/22 1151  GLUCAP 188* 140* 171*    Microbiology Studies:   Recent Results (from the past 240 hour(s))  Urine Culture     Status: Abnormal   Collection Time: 07/02/22  7:06 AM   Specimen: Urine, Clean Catch  Result Value Ref Range Status   Specimen Description URINE, CLEAN CATCH  Final   Special Requests   Final    NONE Performed at [facility], [place] [address], [place], [place] [address]    Culture (A)  Final    >=100,000 COLONIES/mL ESCHERICHIA COLI Confirmed Extended Spectrum Beta-Lactamase Producer (ESBL).  In bloodstream infections from ESBL organisms, carbapenems are preferred over piperacillin/tazobactam. They are shown to have a lower risk of mortality.    Report Status 07/04/2022 FINAL  Final   Organism ID, Bacteria ESCHERICHIA COLI (A)  Final      Susceptibility   Escherichia coli - MIC*    AMPICILLIN >=32 RESISTANT Resistant     CEFAZOLIN >=64 RESISTANT Resistant     CEFEPIME 16 RESISTANT Resistant     CEFTRIAXONE >=64 RESISTANT Resistant     CIPROFLOXACIN 0.5 INTERMEDIATE Intermediate     GENTAMICIN >=16 RESISTANT Resistant     IMIPENEM <=0.25 SENSITIVE Sensitive     NITROFURANTOIN <=16 SENSITIVE Sensitive     TRIMETH/SULFA <=20 SENSITIVE Sensitive     AMPICILLIN/SULBACTAM >=32 RESISTANT Resistant     PIP/TAZO <=4 SENSITIVE Sensitive     * >=100,000 COLONIES/mL ESCHERICHIA COLI  Culture, blood (Routine X 2) w Reflex to ID Panel     Status: None   Collection Time: 07/03/22  3:57 AM   Specimen: BLOOD LEFT WRIST  Result Value Ref Range Status   Specimen Description BLOOD LEFT WRIST  Final   Special Requests   Final    BOTTLES DRAWN AEROBIC ONLY Blood Culture results may not be optimal due to an inadequate volume of blood received in culture bottles   Culture   Final    NO GROWTH 5 DAYS Performed at [facility], [address], [place], [place] [address]    Report Status  07/08/2022 FINAL  Final  Culture, blood (Routine X 2) w Reflex to ID Panel     Status: None   Collection Time: 07/03/22  3:50 PM   Specimen: BLOOD  Result Value Ref Range Status   Specimen Description BLOOD BLOOD LEFT HAND  Final  Special Requests   Final    BOTTLES DRAWN AEROBIC AND ANAEROBIC Blood Culture results may not be optimal due to an inadequate volume of blood received in culture bottles   Culture   Final    NO GROWTH 5 DAYS Performed at [facility], [place] [address], [place], [place] [address]    Report Status 07/08/2022 FINAL  Final    Radiology Studies:  DG Abd Portable 1V  Result Date: 07/08/2022 CLINICAL DATA:  [id number] Encounter for feeding tube placement [id number] EXAM: PORTABLE ABDOMEN - 1 VIEW COMPARISON:  None Available. FINDINGS: Supine frontal view of the abdomen and pelvis was performed, excluding the right hemiabdomen and lower pelvis by collimation. Enteric catheter passes below diaphragm tip projecting over the region of the pylorus. Bowel gas pattern is unremarkable. IMPRESSION: 1. Enteric catheter tip projecting in the region of the pylorus. Electronically Signed   By: Randa Ngo M.D.   On: 07/08/2022 14:44    Scheduled Meds:    Chlorhexidine Gluconate Cloth  6 each Topical Daily   dexamethasone (DECADRON) injection  4 mg Intravenous Q12H   free water  75 mL Per Tube Q4H   insulin aspart  0-9 Units Subcutaneous TID WC   phenytoin (DILANTIN) IV  100 mg Intravenous Q12H   thiamine  100 mg Per Tube Daily    Continuous Infusions:    sodium chloride 75 mL/hr at 07/09/22 1226   feeding supplement (JEVITY 1.2 CAL) 40 mL/hr at 07/09/22 0854   lacosamide (VIMPAT) IV 200 mg (07/09/22 1000)   levETIRAcetam 1,500 mg (07/09/22 0831)     LOS: 8 days     Vernell Leep, MD,  FACP, FHM, [facility], [facility], [place]     To contact the attending provider between 7A-7P or the covering provider during  after hours 7P-7A, please log into the web site [email or web address] and access using universal [place] password for that web site. If you do not have the password, please call the hospital operator.  07/09/2022, 3:46 PM

## 2022-07-10 DIAGNOSIS — C719 Malignant neoplasm of brain, unspecified: Secondary | ICD-10-CM | POA: Diagnosis not present

## 2022-07-10 DIAGNOSIS — R2990 Unspecified symptoms and signs involving the nervous system: Secondary | ICD-10-CM | POA: Diagnosis not present

## 2022-07-10 DIAGNOSIS — E43 Unspecified severe protein-calorie malnutrition: Secondary | ICD-10-CM | POA: Diagnosis not present

## 2022-07-10 DIAGNOSIS — G936 Cerebral edema: Secondary | ICD-10-CM

## 2022-07-10 LAB — CBC
HCT: 44.3 % (ref 36.0–46.0)
Hemoglobin: 14.9 g/dL (ref 12.0–15.0)
MCH: 33 pg (ref 26.0–34.0)
MCHC: 33.6 g/dL (ref 30.0–36.0)
MCV: 98 fL (ref 80.0–100.0)
Platelets: 111 10*3/uL — ABNORMAL LOW (ref 150–400)
RBC: 4.52 MIL/uL (ref 3.87–5.11)
RDW: 13 % (ref 11.5–15.5)
WBC: 8.5 10*3/uL (ref 4.0–10.5)
nRBC: 0 % (ref 0.0–0.2)

## 2022-07-10 LAB — MAGNESIUM: Magnesium: 1.8 mg/dL (ref 1.7–2.4)

## 2022-07-10 LAB — PHOSPHORUS: Phosphorus: 2.3 mg/dL — ABNORMAL LOW (ref 2.5–4.6)

## 2022-07-10 MED ORDER — LACOSAMIDE 10 MG/ML PO SOLN
200.0000 mg | Freq: Two times a day (BID) | ORAL | Status: DC
  Administered 2022-07-10 – 2022-07-16 (×12): 200 mg
  Filled 2022-07-10 (×13): qty 20

## 2022-07-10 MED ORDER — LEVETIRACETAM 100 MG/ML PO SOLN
1500.0000 mg | Freq: Two times a day (BID) | ORAL | Status: DC
  Administered 2022-07-10 – 2022-07-11 (×2): 1500 mg
  Filled 2022-07-10 (×2): qty 15

## 2022-07-10 MED ORDER — ORAL CARE MOUTH RINSE
15.0000 mL | OROMUCOSAL | Status: DC
  Administered 2022-07-10 – 2022-07-16 (×22): 15 mL via OROMUCOSAL

## 2022-07-10 NOTE — Progress Notes (Signed)
  Progress Note   Patient: Miranda Martinez ZOX:[id number] DOB: 26-Jul-1946 DOA: 07/01/2022     9 DOS: the patient was seen and examined on 07/10/2022 at 9:24AM      Brief hospital course: Miranda Martinez is a 76 y.o. F with recently diagnosed high-grade glioma earlier this month who presented with acute worsening neurological symptoms, altered mental status, aphasia, facial twitching and droop.  Evaluated by neurology and found to be in partial status epilepticus.  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  She is unable to articulate anything to me, she does not make eye contact..  Nursing of no complaints.     Physical Exam: BP 136/82 (BP Location: Left Arm)   Pulse 100   Temp 100.3 F (37.9 C) (Axillary)   Resp 20   Wt 57.8 kg Comment: estimated per chart review  SpO2 96%   BMI 21.89 kg/m   Elderly adult female, lying in bed, no acute distress.  She seems to have a right-sided neglect, she seems to look at me when I am on her left side, but follows no commands, makes no spontaneous verbalizations, has some slow and nonpurposeful movements RRR, no murmurs, no peripheral edema Respiratory rate normal, lungs clear without rales or wheezes Abdomen soft mild grimace to palpation    Data Reviewed: Discussed with palliative care, neurology, and neuro-oncology       Disposition: Status is: Inpatient Patient was admitted with altered mental status in the setting of  high-grade glioma  Diagnosed with partial status epilepticus, not responsive to antiepileptics  After discussions with neuro oncology, palliative care, family have expressed that they would like to transition to comfort cares after they have had time to discuss with all of the family members  Palliative involved, likely transitioning to comfort soon, continue current cares for now        Author: Edwin Dada, MD 07/10/2022 12:55 PM  For on call review [email or web address].

## 2022-07-10 NOTE — Progress Notes (Addendum)
CM attempted to discuss residential hospice with pt's son. No family in the room and CM had to leave voicemail for son. Following.  1542: attempted to reach the son again without success  1605: Son, Rob called and CM provided him the choice for residential hospice in [place], [place] and [place]. He will discuss the choices with his family and update either CM or palliative care.

## 2022-07-10 NOTE — Progress Notes (Signed)
Daily Progress Note   Patient Name: Miranda Martinez       Date: 07/10/2022 DOB: [birth year]  Age: 76 y.o. MRN#: [id number] Attending Physician: Edwin Dada, * Primary Care Physician: [name], DO Admit Date: 07/01/2022  Reason for Consultation/Follow-up: Establishing goals of care  Subjective: Chart review performed. Received report from primary RN - no acute concerns.  RN reports patient is nonverbal and intermittently awake/asleep.  Went to visit patient at bedside - son/Rob present.  Patient was lying in bed asleep -did not attempt to wake her. No signs or non-verbal gestures of pain or discomfort noted. No respiratory distress, increased work of breathing, or secretions noted.  [employer] in use.  Met with Rob in [place]. conference room -emotional support provided.  Rob flew in from out of town last night -he travels often, which is why his grandson is present in his absence.  Rob states the patient "looks better today compared to last week."  However, he does recognize that she remains minimally responsive.  Reviewed that she has had no significant improvements - he agrees.  Provided updates from Dr. [name] via neurology notes that patient is not a candidate for further disease modifying treatments unless physical and mental status improves.  Rob was not surprised by this news.  Gently reviewed with Rob that patient is unlikely to improve to the point she would be a candidate for further chemo/radiation -he agrees.   Rob asks " what are our options?" The difference between aggressive medical intervention (PEG, LTC, high risk rehospitalization, likely would remain in current state) and comfort care was considered in light of the patient's goals of care.   We talked about transition to  comfort measures in house and what that would entail inclusive of medications to control pain, dyspnea, agitation, nausea, and itching. We discussed stopping all unnecessary measures such as feeding tube, blood draws, needle sticks, oxygen, antibiotics, CBGs/insulin, cardiac monitoring, IVF, and frequent vital signs. Education provided that other non-pharmacological interventions would be utilized for holistic support and comfort such as spiritual support if requested, repositioning, music therapy, offering comfort feeds, and/or therapeutic listening.   Provided education and counseling at length on the philosophy and benefits of hospice care. Discussed that it offers a holistic approach to care in the setting of end-stage illness, and is about supporting  the patient where they are allowing nature to take it's course. Discussed the hospice team includes RNs, physicians, social workers, and chaplains. They can provide personal care, support for the family, and help keep patient out of the hospital as well as assist with DME needs for home hospice. Education provided on the difference between home vs residential hospice. Rob indicates he would like more information on hospice facilities close to their home in [place]. Once he has this information, he would like to speak with his son/patient's grandson as well as patient's brother about information on comfort/hospice discussed today.   He is agreeable for PMT follow up tomorrow at 9:30am.  All questions and concerns addressed. Encouraged to call with questions and/or concerns. PMT card provided.  Case discussed with Dr. Loleta Books, Dr. [name], Dr. [name].   Length of Stay: 9  Current Medications: Scheduled Meds:   Chlorhexidine Gluconate Cloth  6 each Topical Daily   dexamethasone (DECADRON) injection  4 mg Intravenous Q12H   free water  75 mL Per Tube Q4H   phenytoin (DILANTIN) IV  100 mg Intravenous Q12H   thiamine  100 mg Per Tube Daily     Continuous Infusions:  feeding supplement (JEVITY 1.2 CAL) 60 mL/hr at 07/10/22 0118   lacosamide (VIMPAT) IV 200 mg (07/10/22 1011)   levETIRAcetam 1,500 mg (07/09/22 2145)    PRN Meds: acetaminophen **OR** acetaminophen (TYLENOL) oral liquid 160 mg/5 mL **OR** acetaminophen, mouth rinse  Physical Exam Vitals and nursing note reviewed.  Constitutional:      General: She is not in acute distress.    Appearance: She is ill-appearing.  Pulmonary:     Effort: No respiratory distress.  Skin:    General: Skin is warm and dry.  Neurological:     Mental Status: She is lethargic.     Motor: Weakness present.  Psychiatric:        Speech: She is noncommunicative.             Vital Signs: BP 136/82 (BP Location: Left Arm)   Pulse 100   Temp 100.3 F (37.9 C) (Axillary)   Resp 20   Wt 57.8 kg Comment: estimated per chart review  SpO2 96%   BMI 21.89 kg/m  SpO2: SpO2: 96 % O2 Device: O2 Device: Room Air O2 Flow Rate: O2 Flow Rate (L/min): 2 L/min  Intake/output summary:  Intake/Output Summary (Last 24 hours) at 07/10/2022 1017 Last data filed at 07/10/2022 0459 Gross per 24 hour  Intake --  Output 1300 ml  Net -1300 ml   LBM: Last BM Date : 07/10/22 Baseline Weight: Weight: 57.8 kg (estimated per chart review) Most recent weight: Weight: 57.8 kg (estimated per chart review)       Palliative Assessment/Data: PPS 30% with tube feeds      Patient Active Problem List   Diagnosis Date Noted   Cerebral edema ([place]) 07/10/2022   Protein-calorie malnutrition, severe 07/09/2022   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 female  with past medical history of recent diagnosis of high-grade glioma admitted on 07/01/2022 with stroke-like symptoms.   Assessment: Principal Problem:   Glioblastoma multiforme of brain ([facility]) Active  Problems:   Stroke-like symptoms   Protein-calorie malnutrition, severe   Cerebral edema ([facility])   Concern about end of life  Recommendations/Plan: Continue current supportive treatment Continue DNR/DNI as previously  documented Son would like information on hospice facilities near his home in [place] notified and consult placed Son will speak with patient's grandson and her brother tonight about transition to comfort measures and transfer to hospice facility Follow up meeting scheduled for tomorrow 10/26 at 9:30a PMT will continue to follow and support holistically  Goals of Care and Additional Recommendations: Limitations on Scope of Treatment: Full Scope Treatment  Code Status:    Code Status Orders  (From admission, onward)           Start     Ordered   07/02/22 1336  Do not attempt resuscitation (DNR)  Continuous       Question Answer Comment  In the event of cardiac or respiratory ARREST Do not call a "code blue"   In the event of cardiac or respiratory ARREST Do not perform Intubation, CPR, defibrillation or ACLS   In the event of cardiac or respiratory ARREST Use medication by any route, position, wound care, and other measures to relive pain and suffering. May use oxygen, suction and manual treatment of airway obstruction as needed for comfort.      07/02/22 1335           Code Status History     Date Active Date Inactive Code Status Order ID Comments User Context   07/01/2022 1526 07/02/2022 1335 Full Code [id number]  Karmen Bongo, MD ED   06/16/2022 2145 06/21/2022 2334 Full Code [id number]  Lenore Cordia, MD ED       Prognosis:  Poor  Discharge Planning: To Be Determined  Care plan was discussed with primary RN, patient's son, Dr. Loleta Books, Dr. [name], Dr. [name]  Thank you for allowing the Palliative Medicine Team to assist in the care of this patient.   Total Time 55 minutes Prolonged Time Billed  no       Greater than 50%  of this  time was spent counseling and coordinating care related to the above assessment and plan.  Lin Landsman, NP  Please contact Palliative Medicine Team phone at [phone] for questions and concerns.   *Portions of this note are a verbal dictation therefore any spelling and/or grammatical errors are due to the "[place] [employer]" system interpretation.

## 2022-07-10 NOTE — Progress Notes (Signed)
SLP Cancellation Note  Patient Details Name: Miranda Martinez MRN: [id number] DOB: [birth year]   Cancelled treatment:       Reason Eval/Treat Not Completed: Patient at procedure or test/unavailable (Pt currently being cleaned by RN. SLP will follow up later as schedule allows.)  Evea Sheek I. Hardin Negus, [place], [place] Office number [phone]  Horton Marshall 07/10/2022, 10:25 AM

## 2022-07-11 ENCOUNTER — Inpatient Hospital Stay: Payer: Medicare Other | Admitting: Internal Medicine

## 2022-07-11 DIAGNOSIS — E43 Unspecified severe protein-calorie malnutrition: Secondary | ICD-10-CM | POA: Diagnosis not present

## 2022-07-11 DIAGNOSIS — R2990 Unspecified symptoms and signs involving the nervous system: Secondary | ICD-10-CM | POA: Diagnosis not present

## 2022-07-11 DIAGNOSIS — G936 Cerebral edema: Secondary | ICD-10-CM | POA: Diagnosis not present

## 2022-07-11 DIAGNOSIS — C719 Malignant neoplasm of brain, unspecified: Secondary | ICD-10-CM | POA: Diagnosis not present

## 2022-07-11 LAB — COMPREHENSIVE METABOLIC PANEL
ALT: 64 U/L — ABNORMAL HIGH (ref 0–44)
AST: 29 U/L (ref 15–41)
Albumin: 2.1 g/dL — ABNORMAL LOW (ref 3.5–5.0)
Alkaline Phosphatase: 66 U/L (ref 38–126)
Anion gap: 5 (ref 5–15)
BUN: 14 mg/dL (ref 8–23)
CO2: 28 mmol/L (ref 22–32)
Calcium: 8.3 mg/dL — ABNORMAL LOW (ref 8.9–10.3)
Chloride: 105 mmol/L (ref 98–111)
Creatinine, Ser: 0.5 mg/dL (ref 0.44–1.00)
GFR, Estimated: >60 mL/min (ref >60)
Glucose, Bld: 179 mg/dL — ABNORMAL HIGH (ref 70–99)
Potassium: 3.9 mmol/L (ref 3.5–5.1)
Sodium: 138 mmol/L (ref 135–145)
Total Bilirubin: 0.2 mg/dL — ABNORMAL LOW (ref 0.3–1.2)
Total Protein: 5.1 g/dL — ABNORMAL LOW (ref 6.5–8.1)

## 2022-07-11 LAB — CBC
HCT: 40.6 % (ref 36.0–46.0)
Hemoglobin: 14 g/dL (ref 12.0–15.0)
MCH: 34.2 pg — ABNORMAL HIGH (ref 26.0–34.0)
MCHC: 34.5 g/dL (ref 30.0–36.0)
MCV: 99.3 fL (ref 80.0–100.0)
Platelets: 107 10*3/uL — ABNORMAL LOW (ref 150–400)
RBC: 4.09 MIL/uL (ref 3.87–5.11)
RDW: 13.1 % (ref 11.5–15.5)
WBC: 8.4 10*3/uL (ref 4.0–10.5)
nRBC: 0 % (ref 0.0–0.2)

## 2022-07-11 LAB — PHENYTOIN LEVEL, TOTAL: Phenytoin Lvl: 8.2 ug/mL — ABNORMAL LOW (ref 10.0–20.0)

## 2022-07-11 MED ORDER — LEVETIRACETAM 100 MG/ML PO SOLN
1000.0000 mg | Freq: Two times a day (BID) | ORAL | Status: DC
  Administered 2022-07-11 – 2022-07-16 (×10): 1000 mg
  Filled 2022-07-11 (×11): qty 10

## 2022-07-11 MED ORDER — PHENYTOIN 125 MG/5ML PO SUSP
100.0000 mg | Freq: Three times a day (TID) | ORAL | Status: DC
  Administered 2022-07-11 – 2022-07-14 (×8): 100 mg
  Filled 2022-07-11 (×10): qty 4

## 2022-07-11 NOTE — Progress Notes (Addendum)
Daily Progress Note   Patient Name: Miranda Martinez       Date: 07/11/2022 DOB: December 14, 1945  Age: 76 y.o. MRN#: [id number] Attending Physician: Antonieta Pert, MD Primary Care Physician: [name], DO Admit Date: 07/01/2022  Reason for Consultation/Follow-up: Establishing goals of care  Subjective: Chart review performed. Received report from primary RN - no acute concerns. RN reports patient remains lethargic, non-verbal.  Martin Majestic to visit patient at bedside - no family/visitors present. Patient was lying in bed - I did not attempt to wake her. No signs or non-verbal gestures of pain or discomfort noted. No respiratory distress, increased work of breathing, or secretions noted. Coretrak in use.  9:50 AM Called son/Rob - emotional support provided. He and grandson are walking into the hospital - Rob requests to spend time with patient prior to ongoing discussions. Follow up phone call scheduled for 2:15p.  10:56 AM  Received updates from primary RN that patient was more awake and alert this morning, was able to take her medications.   11:39 AM Per Dr. [name], upon her evaluation, patient was lethargic and not following commands. She feels clinical change could be due to effects of sedating seizure medications - keppra was adjusted to try and minimize sedation.   2:15 PM Called son/Rob. Therapeutic listening provided as he reflects on the events of the morning. He tells me patient was more awake, alert, answering questions, and following commands; however, "25 minutes after taking medications she was snoring."  Per his request, reviewed what transition to comfort measures would entail - specifically continuing seizure medications with recommendation to discontinue tube feeds.   We reviewed the  balance of trying to avoid clinical seizure recurrence as well as minimize sedation, which sometimes cannot be met - he expressed understanding. Reviewed that even if seizures become managed in-house, she remains high risk for recurrent seizures.  We discussed that evidence has shown that in patients at end of life feeding tubes are not recommended. They can cause discomfort in the form of bloating, nausea, vomiting, aspiration, possible infection. Instead, careful hand feeding is recommended in this population, if patient desires eating/drinking. Reviewed concept of letting nature take it's course. Rob expressed understanding.  He will review information provided to him today with other family. At this time, goals are clear to continue current supportive treatment. Family  are open to continue [place] pending clinical course.  All questions and concerns addressed. Encouraged to call with questions and/or concerns. PMT card previously provided.  Length of Stay: 10  Current Medications: Scheduled Meds:   Chlorhexidine Gluconate Cloth  6 each Topical Daily   dexamethasone (DECADRON) injection  4 mg Intravenous Q12H   free water  75 mL Per Tube Q4H   lacosamide  200 mg Per Tube BID   levETIRAcetam  1,500 mg Per Tube BID   mouth rinse  15 mL Mouth Rinse 4 times per day   phenytoin  100 mg Per Tube TID   thiamine  100 mg Per Tube Daily    Continuous Infusions:  feeding supplement (JEVITY 1.2 CAL) 60 mL/hr at 07/10/22 1800    PRN Meds: acetaminophen **OR** acetaminophen (TYLENOL) oral liquid 160 mg/5 mL **OR** acetaminophen, mouth rinse  Physical Exam Vitals and nursing note reviewed.  Constitutional:      General: She is not in acute distress.    Appearance: She is ill-appearing.  Pulmonary:     Effort: No respiratory distress.  Skin:    General: Skin is warm and dry.  Neurological:     Mental Status: She is lethargic.     Motor: Weakness present.             Vital Signs: BP 130/69  (BP Location: Left Arm)   Pulse 91   Temp 100 F (37.8 C) (Axillary)   Resp 18   Wt 57.8 kg Comment: estimated per chart review  SpO2 100%   BMI 21.89 kg/m  SpO2: SpO2: 100 % O2 Device: O2 Device: Nasal Cannula O2 Flow Rate: O2 Flow Rate (L/min): 2 L/min  Intake/output summary:  Intake/Output Summary (Last 24 hours) at 07/11/2022 1035 Last data filed at 07/11/2022 0830 Gross per 24 hour  Intake 2466.28 ml  Output 1460 ml  Net 1006.28 ml   LBM: Last BM Date : 07/10/22 Baseline Weight: Weight: 57.8 kg (estimated per chart review) Most recent weight: Weight: 57.8 kg (estimated per chart review)       Palliative Assessment/Data: PPS 30% with tube feeds.      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 female  with past medical history of recent diagnosis of high-grade glioma admitted on 07/01/2022 with stroke-like symptoms.   Assessment: Principal Problem:   Glioblastoma multiforme of brain (HCC) Active Problems:   Stroke-like symptoms   Protein-calorie malnutrition, severe   Cerebral edema (HCC)   Concern about end of life  Recommendations/Plan: Continue current supportive treatment with watchful waiting; son wants to see how she does with Keppra adjustment Continue DNR/DNI as previously documented Ongoing GOC pending clinical course PMT will continue to follow and support holistically  Goals of Care and Additional Recommendations: Limitations on Scope of Treatment: Full Scope Treatment  Code Status:    Code Status Orders  (From admission, onward)           Start     Ordered   07/02/22 1336  Do not attempt resuscitation (DNR)  Continuous       Question Answer Comment  In the  event of cardiac or respiratory ARREST Do not call a "code blue"   In the event  of cardiac or respiratory ARREST Do not perform Intubation, CPR, defibrillation or ACLS   In the event of cardiac or respiratory ARREST Use medication by any route, position, wound care, and other measures to relive pain and suffering. May use oxygen, suction and manual treatment of airway obstruction as needed for comfort.      07/02/22 1335           Code Status History     Date Active Date Inactive Code Status Order ID Comments User Context   07/01/2022 1526 07/02/2022 1335 Full Code [id number]  Karmen Bongo, MD ED   06/16/2022 2145 06/21/2022 2334 Full Code [id number]  Lenore Cordia, MD ED       Prognosis:  Unable to determine  Discharge Planning: To Be Determined  Care plan was discussed with primary RN, patient's son, Dr. Lupita Leash, TOC, Dr. [name]  Thank you for allowing the Palliative Medicine Team to assist in the care of this patient.   Total Time 40 minutes Prolonged Time Billed  no       Greater than 50%  of this time was spent counseling and coordinating care related to the above assessment and plan.  Lin Landsman, NP  Please contact Palliative Medicine Team phone at [phone] for questions and concerns.   *Portions of this note are a verbal dictation therefore any spelling and/or grammatical errors are due to the "[place] [employer]" system interpretation.

## 2022-07-11 NOTE — Progress Notes (Signed)
PT Cancellation Note  Patient Details Name: Miranda Martinez MRN: [id number] DOB: 13-Mar-1946   Cancelled Treatment:    Reason Eval/Treat Not Completed: Medical issues which prohibited therapy  (Pt family is present and deciding on discharge plans/end of life care. PT/OT offering to see pt today and provide any needed family education, however, family requesting additional time for discussion. Would like PT/OT to continue communicating with them). Told family to reach out to staff if they have any questions for PT/OT, we would be happy to help them navigate any obstacles if they decide to return home in this difficult situation.   Ellouise Newer 07/11/2022, 10:47 AM

## 2022-07-11 NOTE — Progress Notes (Signed)
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   Vital signs: Temp: 98.7 F (37.1 C) (10/26 0358) Temp Source: Oral (10/26 0358) BP: 127/89 (10/26 0358) Pulse Rate: 94 (10/26 0358)  Labs: Lab Results  Component Value Date/Time   Albumin 2.1 (L) 07/11/2022 0505   Phenytoin Lvl 8.2 (L) 07/11/2022 0505   Lab Results  Component Value Date   PHENYTOIN 8.2 (L) 07/11/2022   Estimated Creatinine Clearance: 50.9 mL/min (by C-G formula based on SCr of 0.5 mg/dL).   Medications:  Scheduled:   Chlorhexidine Gluconate Cloth  6 each Topical Daily   dexamethasone (DECADRON) injection  4 mg Intravenous Q12H   free water  75 mL Per Tube Q4H   lacosamide  200 mg Per Tube BID   levETIRAcetam  1,500 mg Per Tube BID   mouth rinse  15 mL Mouth Rinse 4 times per day   phenytoin (DILANTIN) IV  100 mg Intravenous Q12H   thiamine  100 mg Per Tube Daily   Infusions:   feeding supplement (JEVITY 1.2 CAL) 60 mL/hr at 07/10/22 1800     Assessment: 10/26 Phenytoin level: 8.2 (drawn correctly 11 hrs after last dose) Corrected phenytoin level: 15.77 is therapeutic  Seizure activity:  10/19 continues to have epileptiform discharges in left frontal regimen 10/21 suggestive of epileptogenicity arising from left hemisphere, no seizures 10/22  suggestive of epileptogenicity and dysfunction arising from left hemisphere, no seizures 10//25 ok to change phenytoin to per tube per Neuro > will transition after steady state level 10/26. Patient may transition to hospice soon Significant potential drug interactions: phenytoin may theoretically  decrease dexamethasone but available documentation is poor.   - phenytoin is highly bound by tube feeds with up to 50-75% reduction in  levels. Could hold tube feeds around phenytoin administration but this is logistically difficult to do consistently and affects tube feed rates.   Goals of care:  Total phenytoin level: 10-20 mcg/ml Free phenytoin level: 1-2 mcg/ml  Plan:   Change phenytoin to '100mg'$  Q8 hr per tube (dose increased to account for binding to tube feed) Next phenytoin level due 10/29 Pharmacy will continue to follow regarding obtaining total phenytoin levels and dose adjustments as indicated. F/u goals of care  Benetta Spar, PharmD, BCPS, [facility] Clinical Pharmacist  Please check AMION for all [place] phone numbers After 10:00 PM, call [place] [phone]

## 2022-07-11 NOTE — Progress Notes (Signed)
Nutrition Follow-up  DOCUMENTATION CODES:  Severe malnutrition in context of chronic illness  INTERVENTION:  Continue the following TF regimen per cortrak as long as it aligns with GOC: Jevity 1.2 at 67m/h (1.44L/d) Free water flush of 750mq4h This provides 1728kcal, 80g of protein, and 116275mree water (1612m62m+flush) Thiamine 100mg72m0 days for refeeding risk (completed 11/2) Recheck phosphorus to determine if repletion is needed  NUTRITION DIAGNOSIS:  Severe Malnutrition related to chronic illness (cancer) as evidenced by percent weight loss, severe muscle depletion (10.9% weight loss x 3 months). - remains applicable   GOAL:  Patient will meet greater than or equal to 90% of their needs - progressing, being met with TF  MONITOR:  Diet advancement, Labs, I & O's, TF tolerance  REASON FOR ASSESSMENT:  Consult Enteral/tube feeding initiation and management  ASSESSMENT:  Pt with hx of recently dx glioblastoma presented to ED after showing stroke-like symptoms at home.  10/19 - SLP evaluation, NPO 10/22 - SLP evaluation, NPO 10/23 - cortrak placed (pyloric region), TF initiated  Pt remains NPO with TF infusing at goal rate. Noted pt did have low phosphorus after feeds were started, it does not appear they were replaced. Will recheck in AM and recommend repletion if they remain low. Noted BM x4 on 10/24, FMS now in place and per RN output is improving. Could have been due to antibiotic use earlier in the week.  Palliative care team following and working with family on care moving forward. Patient more alert and awake this AM but lethargic and sleeping after receiving seizure medication. PMT educated family on challenges with managing seizures while also minimizing sedation. Also discussed nutrition and educated on hand feeding and TF at end of life. Family is considering moving towards comfort feeds, but has not made a decision yet.  Will continue to monitor. Will leave  nutrition regimen as is.   Nutritionally Relevant Medications: Scheduled Meds:  dexamethasone (DECADRON) injection  4 mg Intravenous Q12H   free water  75 mL Per Tube Q4H   thiamine  100 mg Per Tube Daily   Continuous Infusions:  feeding supplement (JEVITY 1.2 CAL) 60 mL/hr at 07/10/22 1800   Labs Reviewed  NUTRITION - FOCUSED PHYSICAL EXAM: Flowsheet Row Most Recent Value  Orbital Region Mild depletion  Upper Arm Region No depletion  Thoracic and Lumbar Region Mild depletion  Buccal Region Mild depletion  Temple Region Mild depletion  Clavicle Bone Region Mild depletion  Clavicle and Acromion Bone Region Moderate depletion  Scapular Bone Region Mild depletion  Dorsal Hand Severe depletion  Patellar Region Severe depletion  Anterior Thigh Region Severe depletion  Posterior Calf Region Severe depletion  Edema (RD Assessment) None  Hair Reviewed  [dry scalp]  Eyes Unable to assess  Mouth Reviewed  Skin Reviewed  Nails Reviewed    Diet Order:   Diet Order             Diet NPO time specified  Diet effective now                   EDUCATION NEEDS:  Not appropriate for education at this time  Skin:  Skin Assessment: Reviewed RN Assessment  Last BM:  10/26 FMS in place  Height:  Ht Readings from Last 1 Encounters:  06/18/22 '5\' 4"'  (1.626 m)    Weight:  Wt Readings from Last 1 Encounters:  07/04/22 57.8 kg    Ideal Body Weight:  54.5 kg  BMI:  Body mass index is 21.89 kg/m.  Estimated Nutritional Needs:  Kcal:  1600-1800 kcal/d Protein:  80-95g/d Fluid:  >/=1.6L/d   Ranell Patrick, RD, LDN Clinical Dietitian RD pager # available in [facility]  After hours/weekend pager # available in [facility]

## 2022-07-11 NOTE — Progress Notes (Signed)
Speech Language Pathology Treatment: Dysphagia  Patient Details Name: Miranda Martinez MRN: [id number] DOB: 10-Aug-1946 Today's Date: 07/11/2022 Time: 5456-2563 SLP Time Calculation (min) (ACUTE ONLY): 20 min  Assessment / Plan / Recommendation Clinical Impression  Patient seen by SLP for skilled treatment focused on dysphagia goals. Patient awake and alert in room with RN, no family present at this time. Patient with open mouth posture, breathing through mouth and would quickly close eyes and appear to be sleeping without direct tactile, verbal stimulation. She was cooperative with oral care with toothette sponge. She demonstrated closure of lips after oral care but very little lingual movement. Some instances of very low intensity vocalizations observed during session but no real communicative intent. Patient exhibited minimal response (minimal labial movements, no lingual movement) when ice chips introduced into anterior portion of mouth (SLP holding ice chip). SLP then introduced a very small amount (approximately 1/8th teaspoon) of water to anterior portion of mouth and patient did exhibit slightly more response to this, eventually closing lips and initiating a swallow response. SLP did this two times with patient and after second time, she exhibited delayed cough response which persisted for approximately 10-15 seconds. No further PO's given. Unfortunately, patient is still not appropriate for PO's despite being awake and alert and NPO status should be continued. SLP will continue to follow for PO trials.    HPI HPI: 76 y.o. 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      SLP Plan  Continue with current plan of care      Recommendations for follow up therapy are one component of a multi-disciplinary discharge planning process, led by the attending physician.  Recommendations  may be updated based on patient status, additional functional criteria and insurance authorization.    Recommendations  Diet recommendations: NPO Medication Administration: Via alternative means                Oral Care Recommendations: Oral care QID Follow Up Recommendations: Follow physician's recommendations for discharge plan and follow up therapies Assistance recommended at discharge: Frequent or constant Supervision/Assistance SLP Visit Diagnosis: Dysphagia, unspecified (R13.10) Plan: Continue with current plan of care          Sonia Baller, MA, CCC-SLP Speech Therapy

## 2022-07-11 NOTE — Progress Notes (Signed)
Subjective: No acute events overnight.  No family at bedside.  ROS: Unable to obtain due to poor mental status  Examination  Vital signs in last 24 hours: Temp:  [97.6 F (36.4 C)-100 F (37.8 C)] 100 F (37.8 C) (10/26 0830) Pulse Rate:  [91-99] 91 (10/26 0830) Resp:  [16-18] 18 (10/26 0830) BP: (123-147)/(69-96) 130/69 (10/26 0830) SpO2:  [92 %-100 %] 100 % (10/26 0830)  General: lying in bed, NGT in place Neuro: Awake, looking around in the room but not necessarily looking and tracking the examiner, did not follow commands, did not answer any orientation questions, PERRLA, no positive vision, plegic in right upper extremity, spontaneously moving left upper and left lower extremity, subtle movement in right lower extremity.  Of note, per RN patient did appropriately answer yes and no questions and interacted with family, enjoyed listening to [place] earlier   Basic Metabolic Panel: Recent Labs  Lab 07/05/22 1353 07/08/22 1713 07/09/22 0646 07/09/22 1914 07/10/22 0531 07/11/22 0505  NA 136  --  140  --   --  138  K 4.0  --  3.6  --   --  3.9  CL 94*  --  104  --   --  105  CO2 25  --  28  --   --  28  GLUCOSE 152*  --  166*  --   --  179*  BUN 17  --  17  --   --  14  CREATININE 0.59  --  0.64  --   --  0.50  CALCIUM 8.8*  --  8.3*  --   --  8.3*  MG  --  2.0 2.0 1.7 1.8  --   PHOS  --  2.4* 2.1* 2.1* 2.3*  --     CBC: Recent Labs  Lab 07/05/22 0330 07/06/22 0507 07/07/22 0324 07/09/22 0646 07/10/22 0531 07/11/22 0505  WBC 10.3 8.1 8.5 6.8 8.5 8.4  NEUTROABS 9.2* 6.0 6.6  --   --   --   HGB 13.0 14.2 15.0 15.0 14.9 14.0  HCT 38.2 40.8 43.2 45.3 44.3 40.6  MCV 98.2 96.0 97.1 99.3 98.0 99.3  PLT 180 162 159 121* 111* 107*     Coagulation Studies: No results for input(s): "LABPROT", "INR" in the last 72 hours.  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   New onset epilepsy High-grade left temporal neoplasm Acute encephalopathy, multifactorial -Seizures improved after phenytoin load yesterday but continues to have epileptiform discharges in left frontal region -Encephalopathy likely secondary to medications, underlying tumor   Recommendations -Will reduce Keppra to 1000 mg twice daily to minimize sedation -On phenytoin 100 mg every 8 hours, appreciate pharmacy assistance -Continue Vimpat 200 mg twice daily -Of note, per Dr. [name] patient will not be a candidate for chemoradiation unless he has significant improvement in physical or mental status.  Our current goal is to avoid clinical seizure recurrence and minimize sedation. -Appreciate palliative care team's assistance with goals of care discussion -Seizure precautions -As needed IV Ativan 2 mg for clinical seizures -Discussed plan with Dr. Lupita Leash -Neurology will follow peripherally.  Please call us for any further questions  I have spent a total of  36  minutes with the patient reviewing hospital notes,  test results, labs  and examining the patient as well as establishing an assessment and plan.  > 50% of time was spent in direct patient care.   Zeb Comfort Epilepsy [facility] For questions after 5pm please refer to AMION to reach the Neurologist on call

## 2022-07-11 NOTE — Hospital Course (Addendum)
76 year old female with recently diagnosed high-grade glioma earlier this month not on treatment yet admitted with working diagnosis of new onset seizures/acute encephalopathy. Patient was staying with her son and had been functioning independently until morning PTA presented with acute worsening neurological symptoms altered mental status aphasia facial twitching Seen in the ED underwent extensive work-up evaluation, by neurology.  Managed with multiple AEDs, with waxing and waning mental status.  Seen by palliative care.  Care has been challenging given need for AEDs at the same time sedation. After further subsequent discussion son on 10/27decided to proceed with referral to hospice of the [facility] for inpatient hospice placement

## 2022-07-11 NOTE — Progress Notes (Signed)
OT Cancellation Note  Patient Details Name: Nolan Lasser MRN: [id number] DOB: 08/16/1946   Cancelled Treatment:    Reason Eval/Treat Not Completed: Other (comment) (Pt family is present and deciding on discharge plans/end of life care. PT/OT offering to see pt today and provide any needed family education, however, family requesting additional time for discussion. Would like PT/OT to continue communicating with them)  Shanda Howells, OTR/L [facility] Acute Rehabilitation Office: [phone]   [name] 07/11/2022, 10:30 AM

## 2022-07-11 NOTE — Progress Notes (Addendum)
PROGRESS NOTE Miranda Martinez  GHW:[id number] DOB: [birth year] DOA: 07/01/2022 PCP: [name], DO   Brief Narrative/Hospital Course: 76 year old female with recently diagnosed high-grade glioma earlier this month not on treatment yet admitted with working diagnosis of new onset seizures/acute encephalopathy. Patient was staying with her son and had been functioning independently until morning PTA presented with acute worsening neurological symptoms altered mental status aphasia facial twitching Seen in the ED underwent extensive work-up evaluation, by neurology.  Managed with multiple AEDs, with coaxing and waning mental status.  Seen by palliative care.  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 [name]. Cont to minimize sedation. Cont thiamine, Decadron.  Appears more alert awake responsive today.  Continue supportive care.  Dysphagia: Managed with a small bore feeding tube with tube feeds/free water Mild transaminitis:monitor> trending down Urinary retention: Foley catheter back in place Hyperglycemia HbA1c normal Acute cystitis due to E. Coli: completed antibiotics Diarrhea:  Supportive care-no abdominal pain fever no leukocytosis.  She is on tube feeding.  GOC: Patient with complex medical problems, followed by palliative care medicine, at this point plan Is for ongoing palliative care discussion, overall prognosis does not appear bright.  Meeting with palliative care later today  Severe malnutrition:followed by dietitian Etiology: chronic illness (cancer) Signs/Symptoms: percent weight loss, severe muscle depletion (10.9% weight loss x 3 months) Percent weight loss: 10.9 % Interventions: Refer to RD note for recommendations  DVT prophylaxis: SCD's Start: 07/01/22 1525 Code Status:   Code Status: DNR Family Communication: TOC palliative care following. Patient's son updated at the bedside  Patient status is: Inpatient because of high-grade glioma with seizure Level of care: Med-Surg  Dispo:Anticipated disposition: TBD  Objective: Vitals last 24 hrs: Vitals:   07/10/22 1917 07/10/22 2304 07/11/22 0358 07/11/22 0830  BP: 124/78 (!) 136/90 127/89 130/69  Pulse: 99 93 94 91  Resp: '16 16 16 18  '$ Temp: 98.2 F (36.8 C) 98.4 F (36.9 C) 98.7 F (37.1 C) 100 F (37.8 C)  TempSrc: Oral Oral Oral Axillary  SpO2: 97% 92% 98% 100%  Weight:       Weight change:   Physical Examination: General exam: alert awake, responds with yes and no  HEENT:Oral mucosa moist, Ear/Nose WNL grossly Respiratory system: bilaterally CLEAR BS, no use of accessory muscle Cardiovascular system: S1 & S2 +, No JVD. Gastrointestinal system: Abdomen soft,NT,ND, BS+ Nervous System:Alert, awake, moving only left side mostly left upper extremity, rt side flaccid Extremities: LE edema NEG,distal peripheral pulses palpable.  Skin: No rashes,no icterus. MSK: Normal muscle bulk,tone, power  Medications reviewed:scheduled Meds:  Chlorhexidine Gluconate Cloth  6 each Topical Daily   dexamethasone (DECADRON) injection  4 mg Intravenous Q12H   free water  75 mL Per Tube Q4H  lacosamide   200 mg Per Tube BID   levETIRAcetam  1,500 mg Per Tube BID   mouth rinse  15 mL Mouth Rinse 4 times per day   phenytoin  100 mg Per Tube TID   thiamine  100 mg Per Tube Daily   Continuous Infusions:  feeding supplement (JEVITY 1.2 CAL) 60 mL/hr at 07/10/22 1800    Diet Order             Diet NPO time specified  Diet effective now                   Intake/Output Summary (Last 24 hours) at 07/11/2022 1008 Last data filed at 07/11/2022 0830 Gross per 24 hour  Intake 2466.28 ml  Output 1460 ml  Net 1006.28 ml   Net IO Since Admission: -2,016.69 mL [07/11/22 1008]  Wt Readings from Last 3 Encounters:  07/04/22 57.8 kg  06/18/22 58 kg  06/16/22 56.7 kg     Unresulted Labs (From admission, onward)    None     Data Reviewed: I have personally reviewed following labs and imaging studies CBC: Recent Labs  Lab 07/05/22 0330 07/06/22 0507 07/07/22 0324 07/09/22 0646 07/10/22 0531 07/11/22 0505  WBC 10.3 8.1 8.5 6.8 8.5 8.4  NEUTROABS 9.2* 6.0 6.6  --   --   --   HGB 13.0 14.2 15.0 15.0 14.9 14.0  HCT 38.2 40.8 43.2 45.3 44.3 40.6  MCV 98.2 96.0 97.1 99.3 98.0 99.3  PLT 180 162 159 121* 111* 053*   Basic Metabolic Panel: Recent Labs  Lab 07/05/22 1353 07/08/22 1713 07/09/22 0646 07/09/22 1914 07/10/22 0531 07/11/22 0505  NA 136  --  140  --   --  138  K 4.0  --  3.6  --   --  3.9  CL 94*  --  104  --   --  105  CO2 25  --  28  --   --  28  GLUCOSE 152*  --  166*  --   --  179*  BUN 17  --  17  --   --  14  CREATININE 0.59  --  0.64  --   --  0.50  CALCIUM 8.8*  --  8.3*  --   --  8.3*  MG  --  2.0 2.0 1.7 1.8  --   PHOS  --  2.4* 2.1* 2.1* 2.3*  --    GFR: Estimated Creatinine Clearance: 50.9 mL/min (by C-G formula based on SCr of 0.5 mg/dL). Liver Function Tests: Recent Labs  Lab 07/05/22 1422 07/09/22 0646 07/11/22 0505  AST  --  58* 29  ALT  --  88* 64*  ALKPHOS  --  67 66  BILITOT  --  0.8 0.2*  PROT  --  5.4* 5.1*  ALBUMIN 2.8* 2.6*  2.1*   BG: Recent Labs  Lab 07/09/22 0400 07/09/22 0755 07/09/22 1151 07/09/22 1557 07/09/22 2010  GLUCAP 188* 140* 171* 182* 163*   Recent Results (from the past 240 hour(s))  Urine Culture     Status: Abnormal   Collection Time: 07/02/22  7:06 AM   Specimen: Urine, Clean Catch  Result Value Ref Range Status   Specimen Description URINE, CLEAN CATCH  Final   Special Requests   Final    NONE Performed at [facility], [place] [address], [place], [place] [address]    Culture (A)  Final    >=100,000 COLONIES/mL ESCHERICHIA  COLI Confirmed Extended Spectrum Beta-Lactamase Producer (ESBL).  In bloodstream infections from ESBL organisms, carbapenems are preferred over piperacillin/tazobactam. They are shown to have a lower risk of mortality.    Report Status 07/04/2022 FINAL  Final   Organism ID, Bacteria ESCHERICHIA COLI (A)  Final      Susceptibility   Escherichia coli - MIC*    AMPICILLIN >=32 RESISTANT Resistant     CEFAZOLIN >=64 RESISTANT Resistant     CEFEPIME 16 RESISTANT Resistant     CEFTRIAXONE >=64 RESISTANT Resistant     CIPROFLOXACIN 0.5 INTERMEDIATE Intermediate     GENTAMICIN >=16 RESISTANT Resistant     IMIPENEM <=0.25 SENSITIVE Sensitive     NITROFURANTOIN <=16 SENSITIVE Sensitive     TRIMETH/SULFA <=20 SENSITIVE Sensitive     AMPICILLIN/SULBACTAM >=32 RESISTANT Resistant     PIP/TAZO <=4 SENSITIVE Sensitive     * >=100,000 COLONIES/mL ESCHERICHIA COLI  Culture, blood (Routine X 2) w Reflex to ID Panel     Status: None   Collection Time: 07/03/22  3:57 AM   Specimen: BLOOD LEFT WRIST  Result Value Ref Range Status   Specimen Description BLOOD LEFT WRIST  Final   Special Requests   Final    BOTTLES DRAWN AEROBIC ONLY Blood Culture results may not be optimal due to an inadequate volume of blood received in culture bottles   Culture   Final    NO GROWTH 5 DAYS Performed at [facility], [address], [place], [place] [address]    Report Status  07/08/2022 FINAL  Final  Culture, blood (Routine X 2) w Reflex to ID Panel     Status: None   Collection Time: 07/03/22  3:50 PM   Specimen: BLOOD  Result Value Ref Range Status   Specimen Description BLOOD BLOOD LEFT HAND  Final   Special Requests   Final    BOTTLES DRAWN AEROBIC AND ANAEROBIC Blood Culture results may not be optimal due to an inadequate volume of blood received in culture bottles   Culture   Final    NO GROWTH 5 DAYS Performed at [facility], [place] [address] [place], [place] [address]    Report Status 07/08/2022 FINAL  Final    Antimicrobials: Anti-infectives (From admission, onward)    Start     Dose/Rate Route Frequency Ordered Stop   07/04/22 1515  piperacillin-tazobactam (ZOSYN) IVPB 3.375 g        3.375 g 12.5 mL/hr over 240 Minutes Intravenous Every 8 hours 07/04/22 1502 07/07/22 1748      Culture/Microbiology    Component Value Date/Time   SDES BLOOD BLOOD LEFT HAND 07/03/2022 1550   SPECREQUEST  07/03/2022 1550    BOTTLES DRAWN AEROBIC AND ANAEROBIC Blood Culture results may not be optimal due to an inadequate volume of blood received in culture bottles   CULT  07/03/2022 1550    NO GROWTH 5 DAYS Performed at [facility] [address], [place], [place] [address]    REPTSTATUS 07/08/2022 FINAL 07/03/2022 1550    Other culture-see note  Radiology Studies: No results found.   LOS: 10 days   Antonieta Pert, MD [facility]  07/11/2022, 10:08 AM

## 2022-07-12 DIAGNOSIS — Z7189 Other specified counseling: Secondary | ICD-10-CM | POA: Diagnosis not present

## 2022-07-12 DIAGNOSIS — C719 Malignant neoplasm of brain, unspecified: Secondary | ICD-10-CM | POA: Diagnosis not present

## 2022-07-12 DIAGNOSIS — R2990 Unspecified symptoms and signs involving the nervous system: Secondary | ICD-10-CM | POA: Diagnosis not present

## 2022-07-12 DIAGNOSIS — R471 Dysarthria and anarthria: Secondary | ICD-10-CM | POA: Diagnosis not present

## 2022-07-12 LAB — PHOSPHORUS: Phosphorus: 3.2 mg/dL (ref 2.5–4.6)

## 2022-07-12 NOTE — Progress Notes (Signed)
PROGRESS NOTE Miranda Martinez  XKG:[id number] DOB: [birth year] DOA: 07/01/2022 PCP: [name], DO   Brief Narrative/Hospital Course: 76 year old female with recently diagnosed high-grade glioma earlier this month not on treatment yet admitted with working diagnosis of new onset seizures/acute encephalopathy. Patient was staying with her son and had been functioning independently until morning PTA presented with acute worsening neurological symptoms altered mental status aphasia facial twitching Seen in the ED underwent extensive work-up evaluation, by neurology.  Managed with multiple AEDs, with coaxing and waning mental status.  Seen by palliative care.  Subjective: Seen and examined. 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  Goal is to avoid seizure minimize sedation.  Per Dr. [name] patient will not be candidate for chemoradiation unless she has significant improvement in mental and physical wellbeing. Current AEDs being adjusted by neurology continue Vimpat 200  mg bid, Keppra decreased to 1 gm bid 10/26, phenytoin 100 mg tid (previously  level 15-19> 8.2-10/26)  ativan 2 mg prn for seizure.  Patient appears to be more lethargic following medication, got Betac Keppra this morning. Continue thiamine Decadron supportive care fall precaution.  Dysphagia: Continue small bore feeding tube feeding Mild transaminitis:monitor> trending  down Urinary retention: Foley catheter back in place 10/25 Hyperglycemia HbA1c normal.  Monitor Acute cystitis due to E. Coli: completed antibiotics Diarrhea: Supportive care-no abdominal pain fever no leukocytosis.  She is on tube feeding.  GOC: Patient with complex medical problems, followed by palliative care medicine, at this point plan Is for ongoing palliative care discussion, overall prognosis does not appear bright.  Palliative care is following along.  Difficult situation in terms of being able to control seizure WITH Minimal sedation  Severe malnutrition:followed by dietitian Etiology: chronic illness (cancer) Signs/Symptoms: percent weight loss, severe muscle depletion (10.9% weight loss x 3 months) Percent weight loss: 10.9 % Interventions: Refer to RD note for recommendations  DVT prophylaxis: SCD's Start: 07/01/22 1525 Code Status:   Code Status: DNR Family Communication: TOC palliative care following. Patient's son updated at the bedside 10/26, no family today  Patient status is: Inpatient because of high-grade glioma with seizure Level of care: Med-Surg  Dispo:Anticipated disposition: TBD  Objective: Vitals last 24 hrs: Vitals:   07/11/22 1946 07/11/22 2320 07/12/22 0400 07/12/22 0443  BP: 121/74 130/70 107/73   Pulse: 93 93 87   Resp: '17 17 18   '$ Temp: 98.4 F (36.9 C) 98.9 F (37.2 C) 99 F (37.2 C)   TempSrc: Axillary Axillary Axillary   SpO2: 98% 96% 96%   Weight:    56.6 kg   Weight change:   Physical Examination: General exam: Lethargic/sleeping woke up briefly  HEENT:Oral mucosa moist, Ear/Nose WNL grossly, dentition normal. Respiratory system: bilaterally clear BS,no use of accessory muscle Cardiovascular system: S1 & S2 +, regular rate, JVD neg. Gastrointestinal system: Abdomen soft,NT,ND,BS+ Nervous System: Lethargic/sleepy briefly opens eyes went back to sleep.  When awake only able to move LUE Extremities: LE ankle edema neg, lower extremities  warm  Skin: No rashes,no icterus. MSK: Normal muscle bulk,tone, power   Medications reviewed:scheduled Meds:  Chlorhexidine Gluconate Cloth  6 each Topical Daily   dexamethasone (DECADRON) injection  4 mg Intravenous Q12H   free water  75 mL Per Tube Q4H   lacosamide  200 mg Per Tube BID   levETIRAcetam  1,000 mg Per Tube BID   mouth rinse  15 mL Mouth Rinse 4 times per day   phenytoin  100 mg Per Tube TID   thiamine  100 mg Per Tube Daily   Continuous Infusions:  feeding supplement (JEVITY 1.2 CAL) 60 mL/hr at 07/10/22 1800    Diet Order             Diet NPO time specified  Diet effective now                   Intake/Output Summary (Last 24 hours) at 07/12/2022 0754 Last data filed at 07/11/2022 2321 Gross per 24 hour  Intake 80 ml  Output 970 ml  Net -890 ml    Net IO Since Admission: -2,871.69 mL [07/12/22 0754]  Wt Readings from Last 3 Encounters:  07/12/22 56.6 kg  06/18/22 58 kg  06/16/22 56.7 kg     Unresulted Labs (From admission, onward)     Start     Ordered   07/12/22 0500  Phosphorus  Tomorrow morning,   R       Question:  Specimen collection method  Answer:  Lab=Lab collect   07/11/22 1625          Data Reviewed: I have personally reviewed following labs and imaging studies CBC: Recent Labs  Lab 07/06/22 0507 07/07/22 0324 07/09/22 0646 07/10/22 0531 07/11/22 0505  WBC 8.1 8.5 6.8 8.5 8.4  NEUTROABS 6.0 6.6  --   --   --   HGB 14.2 15.0 15.0 14.9 14.0  HCT 40.8 43.2 45.3 44.3 40.6  MCV 96.0 97.1 99.3 98.0 99.3  PLT 162 159 121* 111* 107*    Basic Metabolic Panel: Recent Labs  Lab 07/05/22 1353 07/08/22 1713 07/09/22 0646 07/09/22 1914 07/10/22 0531 07/11/22 0505  NA 136  --  140  --   --  138  K 4.0  --  3.6  --   --  3.9  CL 94*  --  104  --   --  105  CO2 25  --  28  --   --  28  GLUCOSE 152*  --  166*  --   --  179*  BUN 17  --  17  --   --  14  CREATININE 0.59  --  0.64  --   --  0.50  CALCIUM 8.8*  --  8.3*  --    --  8.3*  MG  --  2.0 2.0 1.7 1.8  --   PHOS  --  2.4* 2.1* 2.1* 2.3*  --     GFR: Estimated Creatinine Clearance: 50.9 mL/min (by C-G formula based on SCr of 0.5 mg/dL). Liver Function Tests: Recent Labs  Lab 07/05/22 1422 07/09/22 0646 07/11/22 0505  AST  --  58* 29  ALT  --  88* 64*  ALKPHOS  --  67 66  BILITOT  --  0.8 0.2*  PROT  --  5.4* 5.1*  ALBUMIN 2.8* 2.6* 2.1*    BG: Recent Labs  Lab 07/09/22 0400 07/09/22 0755 07/09/22 1151 07/09/22 1557 07/09/22 2010  GLUCAP 188* 140* 171* 182* 163*  Recent Results (from the past 240 hour(s))  Culture, blood (Routine X 2) w Reflex to ID Panel     Status: None   Collection Time: 07/03/22  3:57 AM   Specimen: BLOOD LEFT WRIST  Result Value Ref Range Status   Specimen Description BLOOD LEFT WRIST  Final   Special Requests   Final    BOTTLES DRAWN AEROBIC ONLY Blood Culture results may not be optimal due to an inadequate volume of blood received in culture bottles   Culture   Final    NO GROWTH 5 DAYS Performed at [facility], [place] [address], [place], [place] [address]    Report Status 07/08/2022 FINAL  Final  Culture, blood (Routine X 2) w Reflex to ID Panel     Status: None   Collection Time: 07/03/22  3:50 PM   Specimen: BLOOD  Result Value Ref Range Status   Specimen Description BLOOD BLOOD LEFT HAND  Final   Special Requests   Final    BOTTLES DRAWN AEROBIC AND ANAEROBIC Blood Culture results may not be optimal due to an inadequate volume of blood received in culture bottles   Culture   Final    NO GROWTH 5 DAYS Performed at [facility], [place] [address] [place], [place] [address]    Report Status 07/08/2022 FINAL  Final    Antimicrobials: Anti-infectives (From admission, onward)    Start     Dose/Rate Route Frequency Ordered Stop   07/04/22 1515  piperacillin-tazobactam (ZOSYN) IVPB 3.375 g        3.375 g 12.5 mL/hr over 240 Minutes Intravenous Every 8 hours 07/04/22 1502 07/07/22 1748       Culture/Microbiology    Component Value Date/Time   SDES BLOOD BLOOD LEFT HAND 07/03/2022 1550   SPECREQUEST  07/03/2022 1550    BOTTLES DRAWN AEROBIC AND ANAEROBIC Blood Culture results may not be optimal due to an inadequate volume of blood received in culture bottles   CULT  07/03/2022 1550    NO GROWTH 5 DAYS Performed at [facility] [address] [place], [place] [address]    REPTSTATUS 07/08/2022 FINAL 07/03/2022 1550    Other culture-see note  Radiology Studies: No results found.   LOS: 11 days   Antonieta Pert, MD [facility]  07/12/2022, 7:54 AM

## 2022-07-12 NOTE — TOC Progression Note (Signed)
Transition of Care [facility]) - Progression Note    Patient Details  Name: Miranda Martinez MRN: [id number] Date of Birth: 08/24/1946  Transition of [facility] CM/SW Contact  Pollie Friar, RN Phone Number: 07/12/2022, 11:59 AM  Clinical Narrative:    CM consulted per palliative care that patient's son, Leonor Liv has decided on [facility]. CM has made referral with [facility]. They do not have a bed today. [facility] will follow and update CM when a bed is available.  Pt will need ambulance transport at d/c.  TOC following.  Expected Discharge Plan: [place] Barriers to Discharge: Continued Medical Work up  Expected Discharge Plan and Services Expected Discharge Plan: [place] In-house Referral: Clinical Social Work Discharge Planning Services: CM Consult                                           Social Determinants of Health (SDOH) Interventions    Readmission Risk Interventions     No data to display

## 2022-07-12 NOTE — Progress Notes (Signed)
SLP Cancellation Note  Patient Details Name: Miranda Martinez MRN: [id number] DOB: 1946-07-28   Cancelled treatment:       Reason Eval/Treat Not Completed: Fatigue/lethargy limiting ability to participate. Pt alert earlier this morning per RN but very lethargic since receiving keppra. Plan is for RN to reach back out to SLP if she becomes more alert later. Will continue to follow.     Osie Bond., M.A. [place] Office [phone]  Secure chat preferred  07/12/2022, 10:58 AM

## 2022-07-12 NOTE — Progress Notes (Signed)
Daily Progress Note   Patient Name: Miranda Martinez       Date: 07/12/2022 DOB: 1946/05/29  Age: 76 y.o. MRN#: [id number] Attending Physician: Antonieta Pert, MD Primary Care Physician: [name], DO Admit Date: 07/01/2022  Reason for Consultation/Follow-up: Establishing goals of care  Subjective: Medical records reviewed including progress notes, labs, imaging.  Patient assessed at the bedside.  She did not arouse to my voice or touch at 10:15am.  No family present during my visit.  I then called patient's son Rob for ongoing goals of care conversation.  Provided update on my assessment today.  He shared that patient had a good morning earlier before receiving her medications, even smiling and laughing along with him and the nurse.  She also was able to state her name slowly and follow some commands.  He knows that she is "still in there" and this certainly makes it more difficult in the decision-making process.  His son is also having a hard time with this, particularly with discontinuation of artificial nutrition.  Rob states he has given him until tonight to continue to reflect and they will discuss again regarding potential transition to full comfort care.  Offered assistance with any further updates or coordination of care and Rob states he would like to go ahead and proceed with referral to [facility] facility for residential hospice placement.   All questions and concerns addressed. Encouraged to call with questions and/or concerns. PMT card previously provided.  Length of Stay: 11   Physical Exam Vitals and nursing note reviewed.  Constitutional:      General: She is not in acute distress.    Appearance: She is ill-appearing.  Pulmonary:     Effort: No respiratory  distress.  Skin:    General: Skin is warm and dry.  Neurological:     Mental Status: She is lethargic.     Comments: Minimally responsive            Vital Signs: BP 115/74 (BP Location: Left Arm)   Pulse 90   Temp 99 F (37.2 C) (Axillary)   Resp 18   Wt 56.6 kg   SpO2 97%   BMI 21.42 kg/m  SpO2: SpO2: 97 % O2 Device: O2 Device: Room Air O2 Flow Rate: O2 Flow Rate (L/min): 2 L/min  Palliative Assessment/Data: PPS 30% with tube feeds.    Palliative Care Assessment & Plan   Patient Profile: 76 y.o. female  with past medical history of recent diagnosis of high-grade glioma admitted on 07/01/2022 with stroke-like symptoms.   Assessment: Principal Problem:   Glioblastoma multiforme of brain ([facility]) Active Problems:   Stroke-like symptoms   Protein-calorie malnutrition, severe   Cerebral edema ([facility])   Concern about end of life  Recommendations/Plan: Continue DNR/DNI; Gold form signed and placed in hard chart in anticipation of transfer to hospice facility Continue current care Ongoing [place] pending clinical course; patient's son will discuss further with family tonight to finalize decisions on transition to full comfort care while admitted Son is agreeable to proceed with referral to [facility] [place] [facility] hosp[facility] today, [facility] assistance is appreciated PMT will continue to follow and support holistically   Prognosis:  Unable to determine  Discharge Planning: To Be Determined  Care plan was discussed with patient's son, Dr. Lupita Leash, [facility]    MDM: High    Dorthy Cooler, [facility] Palliative Medicine Team Team phone # [phone]  Thank you for allowing the Palliative Medicine Team to assist in the care of this patient. Please utilize secure chat with additional questions, if there is no response within 30 minutes please call the above phone number.  Palliative Medicine Team providers are available by phone from 7am to 7pm daily and can be reached through  the team cell phone.  Should this patient require assistance outside of these hours, please call the patient's attending physician.

## 2022-07-13 DIAGNOSIS — C719 Malignant neoplasm of brain, unspecified: Secondary | ICD-10-CM | POA: Diagnosis not present

## 2022-07-13 DIAGNOSIS — Z7189 Other specified counseling: Secondary | ICD-10-CM | POA: Diagnosis not present

## 2022-07-13 DIAGNOSIS — R471 Dysarthria and anarthria: Secondary | ICD-10-CM | POA: Diagnosis not present

## 2022-07-13 DIAGNOSIS — R2990 Unspecified symptoms and signs involving the nervous system: Secondary | ICD-10-CM | POA: Diagnosis not present

## 2022-07-13 LAB — CBC
HCT: 39.4 % (ref 36.0–46.0)
Hemoglobin: 13.5 g/dL (ref 12.0–15.0)
MCH: 33.9 pg (ref 26.0–34.0)
MCHC: 34.3 g/dL (ref 30.0–36.0)
MCV: 99 fL (ref 80.0–100.0)
Platelets: 111 10*3/uL — ABNORMAL LOW (ref 150–400)
RBC: 3.98 MIL/uL (ref 3.87–5.11)
RDW: 13 % (ref 11.5–15.5)
WBC: 6.7 10*3/uL (ref 4.0–10.5)
nRBC: 0 % (ref 0.0–0.2)

## 2022-07-13 LAB — BASIC METABOLIC PANEL
Anion gap: 12 (ref 5–15)
BUN: 16 mg/dL (ref 8–23)
CO2: 26 mmol/L (ref 22–32)
Calcium: 8.6 mg/dL — ABNORMAL LOW (ref 8.9–10.3)
Chloride: 100 mmol/L (ref 98–111)
Creatinine, Ser: 0.5 mg/dL (ref 0.44–1.00)
GFR, Estimated: >60 mL/min (ref >60)
Glucose, Bld: 246 mg/dL — ABNORMAL HIGH (ref 70–99)
Potassium: 4.3 mmol/L (ref 3.5–5.1)
Sodium: 138 mmol/L (ref 135–145)

## 2022-07-13 NOTE — Progress Notes (Signed)
Daily Progress Note   Patient Name: Miranda Martinez       Date: 07/13/2022 DOB: August 05, 1946  Age: 76 y.o. MRN#: [id number] Attending Physician: Miranda Martinez Primary Care Physician: Miranda Martinez Admit Date: 07/01/2022  Reason for Consultation/Follow-up: Establishing goals of care  Subjective: Medical records reviewed including progress notes, labs, imaging.  Patient assessed at the bedside.  She is more alert this morning, intermittently answering questions and following commands.  Her son and grandson are present visiting.  Created space and opportunity for patient's thoughts and feelings on her current illness.  Reviewed her course of illness briefly and inquired whether this makes sense to her, but she states no.  She denies any pain or distress at this time.  She is unable to state whether she would find it acceptable to be in her current bedbound and lethargic state for prolonged period.  She appears to be thinking when asked if she would prefer comfort feeds or PEG tube.  Family shares that she has been very persistent about "going" during her evening discussions.  When they describe the option of going to a hospice facility, she states she would be okay with this.  They plan to Martinez a tour later today and would like to continue current care in the meantime, in order for patient to be as stable as possible for further goals of care conversations.  All questions and concerns addressed. Encouraged to call with questions and/or concerns. PMT card previously provided.  Length of Stay: 12   Physical Exam Vitals and nursing note reviewed.  Constitutional:      General: She is not in acute distress.    Appearance: She is ill-appearing.  Cardiovascular:     Rate and Rhythm: Normal rate.   Pulmonary:     Effort: No respiratory distress.  Skin:    General: Skin is warm and dry.  Neurological:     Mental Status: She is alert. She is confused.  Psychiatric:        Speech: Speech is slurred.        Cognition and Memory: Cognition is impaired.            Vital Signs: BP 125/73 (BP Location: Left Arm)   Pulse 90   Temp 97.7 F (36.5 C) (Axillary)   Resp 20  Wt 58.1 kg   SpO2 98%   BMI 21.99 kg/m  SpO2: SpO2: 98 % O2 Device: O2 Device: Room Air O2 Flow Rate: O2 Flow Rate (L/min): 2 L/min      Palliative Assessment/Data: PPS 30% with tube feeds.    Palliative Care Assessment & Plan   Patient Profile: 76 y.o. female  with past medical history of recent diagnosis of high-grade glioma admitted on 07/01/2022 with stroke-like symptoms.   Assessment: Principal Problem:   Glioblastoma multiforme of brain (HCC) Active Problems:   Stroke-like symptoms   Protein-calorie malnutrition, severe   Cerebral edema (HCC)   Concern about end of life  Recommendations/Plan: Continue DNR/DNI Continue current care for now; family hopes to have continued Miranda Martinez discussions with patient to ensure she is at peace with her prognosis prior to discharge to hospice facility Patient remains appropriate for hospice of the [place] facility when a bed is available PMT will continue to follow and support holistically   Prognosis: Poor prognosis given high-grade glioma, cognitive decline, lethargy  Discharge Planning: To Be Determined  Care plan was discussed with patient, patient's son, patient's grandson, Dr. Lupita Martinez, [facility]    MDM: High   Miranda Martinez, [facility] Palliative Medicine Team Team phone # [phone]  Thank you for allowing the Palliative Medicine Team to assist in the care of this patient. Please utilize secure chat with additional questions, if there is no response within 30 minutes please call the above phone number.  Palliative Medicine Team providers are available  by phone from 7am to 7pm daily and can be reached through the team cell phone.  Should this patient require assistance outside of these hours, please call the patient's attending physician.

## 2022-07-13 NOTE — Progress Notes (Signed)
PROGRESS NOTE Miranda Martinez  OTL:[id number] DOB: 11/30/1945 DOA: 07/01/2022 PCP: [name], DO   Brief Narrative/Hospital Course: 76 year old female with recently diagnosed high-grade glioma earlier this month not on treatment yet admitted with working diagnosis of new onset seizures/acute encephalopathy. Patient was staying with her son and had been functioning independently until morning PTA presented with acute worsening neurological symptoms altered mental status aphasia facial twitching Seen in the ED underwent extensive work-up evaluation, by neurology.  Managed with multiple AEDs, with waxing and waning mental status.  Seen by palliative care.  Care has been challenging given need for AEDs at the same time sedation. After further subsequent discussion son on 10/27decided to proceed with referral to [facility]ice of the [place] for inpatient hospice placement   Subjective: Seen and examined this morning.  Son is at the bedside Patient is much more alert awake nods for response Son states he has been able to write some. Overnight afebrile on room air.  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  Goal is to avoid seizure minimize sedation.  Per Dr. [name] patient will not be candidate for chemoradiation unless she has significant improvement in mental and physical wellbeing. Current AEDs being adjusted by neurology On Vimpat 200  mg bid, Keppra decreased to 1 gm bid 10/26, phenytoin 100 mg tid (previously  level 15-19> 8.2-10/26)  ativan 2 mg prn for seizure.  Patient-more lethargic following medication-Care has  been challenging given need for AEDs at the same time sedation. After further subsequent discussion son on 10/27decided to proceed with referral to [facility] of the [place] for inpatient hospice placement> given improvement in some of documents continue with current care until hospice bed opens up  Dysphagia: on small bore feeding tube feeding Mild transaminitis:monitor> trending down Urinary retention: Foley catheter back in place 10/25 Hyperglycemia HbA1c normal.  Monitor Acute cystitis due to E. Coli: completed antibiotics Diarrhea: Supportive care-no abdominal pain fever no leukocytosis.  She is on tube feeding.  GOC: See #1>referred to inpatient hospice.    Severe malnutrition:followed by dietitian Etiology: chronic illness (cancer) Signs/Symptoms: percent weight loss, severe muscle depletion (10.9% weight loss x 3 months) Percent weight loss: 10.9 % Interventions: Refer to RD note for recommendations  DVT prophylaxis: SCD's Start: 07/01/22 1525 Code Status:   Code Status: DNR Family Communication: TOC palliative care following.  Patient's son updated at the bedside 10/26. patient status is: Inpatient because of high-grade glioma with seizure Level of care: Med-Surg  Dispo:Anticipated disposition: Hospice  Objective: Vitals last 24 hrs: Vitals:   07/13/22 0026 07/13/22 0409 07/13/22 0434 07/13/22 0721  BP: 122/63  (!) 123/56 125/73  Pulse: 84  89 90  Resp: '20  20 20  '$ Temp: 98.8 F (37.1 C)  (!) 97.3 F (36.3 C) 97.7 F (36.5 C)  TempSrc: Axillary  Axillary Axillary  SpO2: 97%  99% 98%  Weight:  58.1 kg     Weight change: 1.5 kg  Physical Examination:  General exam: Alert awake nods head  for response HEENT:Oral mucosa moist, Ear/Nose WNL grossly, dentition normal. Respiratory system: bilaterally clear BS,no use of accessory muscle Cardiovascular system: S1 & S2 +, regular rate  Gastrointestinal system: Abdomen soft,NT,ND,BS+ Nervous System: moving LUE,Not able to  move right side Extremities: LE ankle edema neg, lower extremities warm Skin: No rashes,no icterus. MSK: Normal muscle bulk,tone, power   Medications reviewed:scheduled Meds:  Chlorhexidine Gluconate Cloth  6 each Topical Daily   dexamethasone (DECADRON) injection  4 mg Intravenous Q12H   free water  75 mL Per Tube Q4H   lacosamide  200 mg Per Tube BID   levETIRAcetam  1,000 mg Per Tube BID   mouth rinse  15 mL Mouth Rinse 4 times per day   phenytoin  100 mg Per Tube TID   thiamine  100 mg Per Tube Daily   Continuous Infusions:  feeding supplement (JEVITY 1.2 CAL) 1,000 mL (07/12/22 1159)    Diet Order             Diet NPO time specified  Diet effective now                   Intake/Output Summary (Last 24 hours) at 07/13/2022 0803 Last data filed at 07/13/2022 0500 Gross per 24 hour  Intake 80 ml  Output 1225 ml  Net -1145 ml   Net IO Since Admission: -4,016.69 mL [07/13/22 0803]  Wt Readings from Last 3 Encounters:  07/13/22 58.1 kg  06/18/22 58 kg  06/16/22 56.7 kg     Unresulted Labs (From admission, onward)     Start     Ordered   07/13/22 7591  Basic metabolic panel  Daily at 5am,   R     Question:  Specimen collection method  Answer:  Lab=Lab collect   07/12/22 1042   07/13/22 0500  CBC  Daily at 5am,   R     Question:  Specimen collection method  Answer:  Lab=Lab collect   07/12/22 1042          Data Reviewed: I have personally reviewed following labs and imaging studies CBC: Recent Labs  Lab 07/07/22 0324 07/09/22 0646 07/10/22 0531 07/11/22 0505 07/13/22 0225  WBC 8.5 6.8 8.5 8.4 6.7  NEUTROABS 6.6  --   --   --   --   HGB 15.0 15.0 14.9 14.0 13.5  HCT 43.2 45.3 44.3 40.6 39.4  MCV 97.1 99.3 98.0 99.3 99.0  PLT 159 121* 111* 107* 638*   Basic Metabolic Panel: Recent Labs  Lab 07/08/22 1713 07/09/22 0646 07/09/22 1914 07/10/22 0531 07/11/22 0505 07/12/22 0656 07/13/22 0225  NA  --  140  --   --  138  --  138  K  --  3.6  --    --  3.9  --  4.3  CL  --  104  --   --  105  --  100  CO2  --  28  --   --  28  --  26  GLUCOSE  --  166*  --   --  179*  --  246*  BUN  --  17  --   --  14  --  16  CREATININE  --  0.64  --   --  0.50  --  0.50  CALCIUM  --  8.3*  --   --  8.3*  --  8.6*  MG 2.0 2.0 1.7 1.8  --   --   --   PHOS 2.4* 2.1* 2.1* 2.3*  --  3.2  --    GFR: Estimated Creatinine Clearance: 50.9 mL/min (by C-G formula based on SCr  of 0.5 mg/dL). Liver Function Tests: Recent Labs  Lab 07/09/22 0646 07/11/22 0505  AST 58* 29  ALT 88* 64*  ALKPHOS 67 66  BILITOT 0.8 0.2*  PROT 5.4* 5.1*  ALBUMIN 2.6* 2.1*   BG: Recent Labs  Lab 07/09/22 0400 07/09/22 0755 07/09/22 1151 07/09/22 1557 07/09/22 2010  GLUCAP 188* 140* 171* 182* 163*   Recent Results (from the past 240 hour(s))  Culture, blood (Routine X 2) w Reflex to ID Panel     Status: None   Collection Time: 07/03/22  3:50 PM   Specimen: BLOOD  Result Value Ref Range Status   Specimen Description BLOOD BLOOD LEFT HAND  Final   Special Requests   Final    BOTTLES DRAWN AEROBIC AND ANAEROBIC Blood Culture results may not be optimal due to an inadequate volume of blood received in culture bottles   Culture   Final    NO GROWTH 5 DAYS Performed at [facility], [place] [address] [place], [place] [address]    Report Status 07/08/2022 FINAL  Final    Antimicrobials: Anti-infectives (From admission, onward)    Start     Dose/Rate Route Frequency Ordered Stop   07/04/22 1515  piperacillin-tazobactam (ZOSYN) IVPB 3.375 g        3.375 g 12.5 mL/hr over 240 Minutes Intravenous Every 8 hours 07/04/22 1502 07/07/22 1748      Culture/Microbiology    Component Value Date/Time   SDES BLOOD BLOOD LEFT HAND 07/03/2022 1550   SPECREQUEST  07/03/2022 1550    BOTTLES DRAWN AEROBIC AND ANAEROBIC Blood Culture results may not be optimal due to an inadequate volume of blood received in culture bottles   CULT  07/03/2022 1550    NO GROWTH 5  DAYS Performed at [place] [address] [place], [place] [address]    REPTSTATUS 07/08/2022 FINAL 07/03/2022 1550    Other culture-see note  Radiology Studies: No results found.   LOS: 12 days   Antonieta Pert, MD [facility]  07/13/2022, 8:03 AM

## 2022-07-14 DIAGNOSIS — C719 Malignant neoplasm of brain, unspecified: Secondary | ICD-10-CM | POA: Diagnosis not present

## 2022-07-14 DIAGNOSIS — E43 Unspecified severe protein-calorie malnutrition: Secondary | ICD-10-CM | POA: Diagnosis not present

## 2022-07-14 DIAGNOSIS — R471 Dysarthria and anarthria: Secondary | ICD-10-CM | POA: Diagnosis not present

## 2022-07-14 DIAGNOSIS — R2990 Unspecified symptoms and signs involving the nervous system: Secondary | ICD-10-CM | POA: Diagnosis not present

## 2022-07-14 DIAGNOSIS — R569 Unspecified convulsions: Secondary | ICD-10-CM | POA: Diagnosis not present

## 2022-07-14 DIAGNOSIS — Z7189 Other specified counseling: Secondary | ICD-10-CM | POA: Diagnosis not present

## 2022-07-14 LAB — PHENYTOIN LEVEL, TOTAL: Phenytoin Lvl: 4.5 ug/mL — ABNORMAL LOW (ref 10.0–20.0)

## 2022-07-14 LAB — GLUCOSE, CAPILLARY
Glucose-Capillary: 191 mg/dL — ABNORMAL HIGH (ref 70–99)
Glucose-Capillary: 215 mg/dL — ABNORMAL HIGH (ref 70–99)

## 2022-07-14 MED ORDER — PHENYTOIN 125 MG/5ML PO SUSP
125.0000 mg | Freq: Three times a day (TID) | ORAL | Status: DC
  Administered 2022-07-14 – 2022-07-16 (×5): 125 mg
  Filled 2022-07-14 (×10): qty 5

## 2022-07-14 NOTE — Progress Notes (Signed)
PROGRESS NOTE Miranda Martinez  SHF:[id number] DOB: October 28, 1945 DOA: 07/01/2022 PCP: [name], DO   Brief Narrative/Hospital Course: 76 year old female with recently diagnosed high-grade glioma earlier this month not on treatment, admitted with working diagnosis of new onset seizures/acute encephalopathy. Patient was staying with her son and had been functioning independently until morning PTA presented with acute worsening neurological symptoms altered mental status, aphasia, facial twitching. Seen in the ED underwent extensive work-up evaluation, by neurology.  Managed with multiple AEDs, with waxing and waning mental status.  Seen by palliative care.  Care has been challenging given need for AEDs at the same time being sedated. After further subsequent discussion with son, on 10/27, decided to proceed with referral to hospice of the [facility] for inpatient hospice placement.    Subjective: Seen and examined at bedside.  Patient noted to be sleeping, lethargic, not easily arousable.  Unable to follow commands or engage in meaningful conversation as of this morning.     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  Goal is to avoid seizure, minimize sedation.  Per Dr. [name] patient will not be candidate for chemoradiation unless she has significant improvement in mental and physical wellbeing Current AEDs being adjusted by neurology and pharmacy Patient more lethargic following medication, care has been challenging given need for AEDs at the same time sedation After further subsequent discussion son on 10/27decided to  proceed with referral to hospice of the [facility] for inpatient hospice placement Continue with current care until hospice bed is available  Dysphagia: on small bore feeding tube feeding Mild transaminitis:monitor> trending down Urinary retention: Foley catheter back in place 10/25 Hyperglycemia HbA1c normal.  Monitor Acute cystitis due to E. Coli: completed antibiotics Diarrhea: Supportive care-no abdominal pain fever no leukocytosis.  She is on tube feeding. GOC: Referred to inpatient hospice.    Severe malnutrition:followed by dietitian Etiology: chronic illness (cancer) Signs/Symptoms: percent weight loss, severe muscle depletion (10.9% weight loss x 3 months) Percent weight loss: 10.9 % Interventions: Refer to RD note for recommendations  DVT prophylaxis: SCD's Start: 07/01/22 1525 Code Status:   Code Status: DNR Family Communication: TOC palliative care following.   Patient status is: Inpatient because of high-grade glioma with seizure Level of care: Med-Surg  Dispo:Anticipated disposition: Inpatient hospice    Objective: Vitals last 24 hrs: Vitals:   07/14/22 0033 07/14/22 0340 07/14/22 0500 07/14/22 1118  BP: 117/61 115/72  114/81  Pulse: 82 85  85  Resp: '18 18  17  '$ Temp: 100.1 F (37.8 C) 99.4 F (37.4 C)  (!) 97.5 F (36.4 C)  TempSrc: Axillary Axillary  Axillary  SpO2: 97% 98%  98%  Weight:   57.1 kg      Physical Examination: General: NAD, lethargic, sleepy Cardiovascular: S1, S2 present Respiratory: CTAB Abdomen: Soft, nontender, nondistended, bowel sounds present Musculoskeletal: No bilateral pedal edema noted Skin: Normal Psychiatry: Unable to assess   Medications reviewed:scheduled Meds:  Chlorhexidine Gluconate Cloth  6 each Topical Daily   dexamethasone (DECADRON) injection  4 mg Intravenous Q12H   free water  75 mL Per Tube Q4H   lacosamide  200 mg Per Tube BID   levETIRAcetam  1,000 mg  Per Tube BID   mouth rinse  15 mL Mouth Rinse 4  times per day   phenytoin  125 mg Per Tube TID   thiamine  100 mg Per Tube Daily   Continuous Infusions:  feeding supplement (JEVITY 1.2 CAL) 1,000 mL (07/14/22 1240)    Diet Order             Diet NPO time specified  Diet effective now                   Intake/Output Summary (Last 24 hours) at 07/14/2022 1436 Last data filed at 07/14/2022 0600 Gross per 24 hour  Intake --  Output 1425 ml  Net -1425 ml   Net IO Since Admission: -5,441.69 mL [07/14/22 1436]  Wt Readings from Last 3 Encounters:  07/14/22 57.1 kg  06/18/22 58 kg  06/16/22 56.7 kg     Unresulted Labs (From admission, onward)     Start     Ordered   07/15/22 0500  Comprehensive metabolic panel  Tomorrow morning,   R       Question:  Specimen collection method  Answer:  Lab=Lab collect   07/14/22 1021          Data Reviewed: I have personally reviewed following labs and imaging studies CBC: Recent Labs  Lab 07/09/22 0646 07/10/22 0531 07/11/22 0505 07/13/22 0225  WBC 6.8 8.5 8.4 6.7  HGB 15.0 14.9 14.0 13.5  HCT 45.3 44.3 40.6 39.4  MCV 99.3 98.0 99.3 99.0  PLT 121* 111* 107* 332*   Basic Metabolic Panel: Recent Labs  Lab 07/08/22 1713 07/09/22 0646 07/09/22 1914 07/10/22 0531 07/11/22 0505 07/12/22 0656 07/13/22 0225  NA  --  140  --   --  138  --  138  K  --  3.6  --   --  3.9  --  4.3  CL  --  104  --   --  105  --  100  CO2  --  28  --   --  28  --  26  GLUCOSE  --  166*  --   --  179*  --  246*  BUN  --  17  --   --  14  --  16  CREATININE  --  0.64  --   --  0.50  --  0.50  CALCIUM  --  8.3*  --   --  8.3*  --  8.6*  MG 2.0 2.0 1.7 1.8  --   --   --   PHOS 2.4* 2.1* 2.1* 2.3*  --  3.2  --    GFR: Estimated Creatinine Clearance: 50.9 mL/min (by C-G formula based on SCr of 0.5 mg/dL). Liver Function Tests: Recent Labs  Lab 07/09/22 0646 07/11/22 0505  AST 58* 29  ALT 88* 64*  ALKPHOS 67 66  BILITOT 0.8 0.2*  PROT 5.4* 5.1*  ALBUMIN 2.6* 2.1*   BG: Recent Labs   Lab 07/09/22 0400 07/09/22 0755 07/09/22 1151 07/09/22 1557 07/09/22 2010  GLUCAP 188* 140* 171* 182* 163*   No results found for this or any previous visit (from the past 240 hour(s)).   Antimicrobials: Anti-infectives (From admission, onward)    Start     Dose/Rate Route Frequency Ordered Stop   07/04/22 1515  piperacillin-tazobactam (ZOSYN) IVPB 3.375 g        3.375 g 12.5 mL/hr over 240 Minutes Intravenous Every 8 hours 07/04/22 1502 07/07/22 1748  Culture/Microbiology    Component Value Date/Time   SDES BLOOD BLOOD LEFT HAND 07/03/2022 1550   SPECREQUEST  07/03/2022 1550    BOTTLES DRAWN AEROBIC AND ANAEROBIC Blood Culture results may not be optimal due to an inadequate volume of blood received in culture bottles   CULT  07/03/2022 1550    NO GROWTH 5 DAYS Performed at [facility], [place] [address] [place], [place] [address]    REPTSTATUS 07/08/2022 FINAL 07/03/2022 1550    Other culture-see note  Radiology Studies: No results found.   LOS: 13 days   Alma Friendly, MD [facility]  07/14/2022, 2:36 PM

## 2022-07-14 NOTE — Progress Notes (Signed)
Daily Progress Note   Patient Name: Miranda Martinez       Date: 07/14/2022 DOB: [birth year]  Age: 76 y.o. MRN#: [id number] Attending Physician: Alma Friendly, MD Primary Care Physician: [name], DO Admit Date: 07/01/2022  Reason for Consultation/Follow-up: Establishing goals of care  Subjective: AM: Medical records reviewed including progress notes, labs, imaging.  Patient assessed at the bedside.  She clearly denies pain or distress, remains lethargic.  She states "I do not want to" when asked to follow commands. Her son, grandson, and grandson's girlfriend are present visiting.  After further family discussion, family is also interested in hospice at home potentially.  We discussed the level of support available, equipment delivery, nursing needs including Foley catheter management.  Miranda Martinez would be willing to do a lot of the care required and wishes for further clarification on what this would look like in order to know what to expect. He is also in the process of making arrangements with a funeral home and has questions regarding the process of obtaining Social Security number for her death certificate. Informed him I would reach out to hospice of the [facility] hospital liaison for further discussion.   PM: Received update that patient is more alert this afternoon and family would like a visit.  Unfortunately, by the time I was able to return to the bedside she was lethargic again.  I then called patient's son Miranda Martinez to follow-up.  He tells me she was the most interactive she has ever been, even making jokes about eating a Snickers bar through her tube.  Provided him with update on my attempt to find out about the death certificate. Will continue efforts.  All questions and concerns  addressed. Encouraged to call with questions and/or concerns. PMT card previously provided.  Length of Stay: 13   Physical Exam Vitals and nursing note reviewed.  Constitutional:      General: She is not in acute distress.    Appearance: She is ill-appearing.  Cardiovascular:     Rate and Rhythm: Normal rate.  Pulmonary:     Effort: No respiratory distress.  Skin:    General: Skin is warm and dry.  Neurological:     Mental Status: She is alert. She is confused.  Psychiatric:        Speech: Speech is slurred.  Cognition and Memory: Cognition is impaired.            Vital Signs: BP 115/72 (BP Location: Left Arm)   Pulse 85   Temp 99.4 F (37.4 C) (Axillary)   Resp 18   Wt 57.1 kg   SpO2 98%   BMI 21.61 kg/m  SpO2: SpO2: 98 % O2 Device: O2 Device: Room Air O2 Flow Rate: O2 Flow Rate (L/min): 2 L/min      Palliative Assessment/Data: PPS 30% with tube feeds.    Palliative Care Assessment & Plan   Patient Profile: 76 y.o. female  with past medical history of recent diagnosis of high-grade glioma admitted on 07/01/2022 with stroke-like symptoms.   Assessment: Principal Problem:   Glioblastoma multiforme of brain (HCC) Active Problems:   Stroke-like symptoms   Protein-calorie malnutrition, severe   Cerebral edema ([place])   Concern about end of life  Recommendations/Plan: Continue DNR/DNI Continue current care Patient's family is also interested in hospice at home, I have sent secure chat to [place] [facility] of the [employer] Ongoing goals of care discussions PMT will continue to follow and support holistically   Prognosis: Poor prognosis given high-grade glioma, cognitive decline, lethargy  Discharge Planning: To Be Determined   MDM: High   Kamarian Sahakian Gregary Signs Palliative Medicine Team Team phone # [phone]  Thank you for allowing the Palliative Medicine Team to assist in the care of this patient. Please utilize secure chat  with additional questions, if there is no response within 30 minutes please call the above phone number.  Palliative Medicine Team providers are available by phone from 7am to 7pm daily and can be reached through the team cell phone.  Should this patient require assistance outside of these hours, please call the patient's attending physician.

## 2022-07-14 NOTE — Progress Notes (Signed)
Per Benjamine Mola with [facility], no beds available today. SW will follow.   Wandra Feinstein, MSW, LCSW [phone] (coverage)

## 2022-07-14 NOTE — Progress Notes (Signed)
Phenytoin Follow-Up Consult Indication: New onset epilepsy in the setting of High-grade left temporal neoplasm  Allergies  Allergen Reactions   Codeine Itching    Patient Measurements: Weight: 57.1 kg (125 lb 14.1 oz)   Body mass index is 21.61 kg/m.   Vital signs: Temp: 99.4 F (37.4 C) (10/29 0340) Temp Source: Axillary (10/29 0340) BP: 115/72 (10/29 0340) Pulse Rate: 85 (10/29 0340)  Labs: Lab Results  Component Value Date/Time   Phenytoin Lvl 4.5 (L) 07/14/2022 0826   Lab Results  Component Value Date   PHENYTOIN 4.5 (L) 07/14/2022   Estimated Creatinine Clearance: 50.9 mL/min (by C-G formula based on SCr of 0.5 mg/dL).   Medications:  Scheduled:   Chlorhexidine Gluconate Cloth  6 each Topical Daily   dexamethasone (DECADRON) injection  4 mg Intravenous Q12H   free water  75 mL Per Tube Q4H   lacosamide  200 mg Per Tube BID   levETIRAcetam  1,000 mg Per Tube BID   mouth rinse  15 mL Mouth Rinse 4 times per day   phenytoin  100 mg Per Tube TID   thiamine  100 mg Per Tube Daily    Assessment: 10/29 Phenytoin level: 4.5 (drawn at 0830, prior to their next dose at 0848) Corrected phenytoin level (if needed): 8.65 (Albumin 2.1 on 10/26) Seizure activity:  10/19 continues to have epileptiform discharges in left frontal regimen 10/21 suggestive of epileptogenicity arising from left hemisphere, no seizures 10/22  suggestive of epileptogenicity and dysfunction arising from left hemisphere, no seizures 10//25 ok to change phenytoin to per tube per Neuro > will transition after steady state level 10/26. Patient may transition to hospice soon 10/26: Seizures improved after phenytoin load yesterday but continues to have epileptiform discharges in left frontal region 10/27: decided to proceed with referral to hospice of the [facility] for inpatient hospice placement> given improvement in some of documents continue with current care until hospice bed opens up 10/28: missed  1600 dose > nothing documented on MAR Significant potential drug interactions:  - phenytoin may theoretically  decrease dexamethasone but available documentation is poor.   - phenytoin is highly bound by tube feeds with up to 50-75% reduction in levels. Could hold tube feeds around phenytoin administration but this is logistically difficult to do consistently and affects tube feed rates.   Goals of care:  Total phenytoin level: 10-20 mcg/ml Free phenytoin level: 1-2 mcg/ml  Plan:  Change phenytoin to 125 mg Q8 hr per tube (dose increased to account for binding to tube feed) and per subtherapeutic level Pharmacy will continue to follow regarding obtaining total phenytoin levels and dose adjustments as indicated. F/u goals of care  Varney Daily, PharmD PGY2 Pharmacy Resident 07/14/2022,9:54 AM  Please check AMION for all [facility] pharmacy phone numbers After 10:00 PM call main pharmacy [phone]

## 2022-07-15 DIAGNOSIS — R569 Unspecified convulsions: Secondary | ICD-10-CM | POA: Diagnosis not present

## 2022-07-15 DIAGNOSIS — E43 Unspecified severe protein-calorie malnutrition: Secondary | ICD-10-CM | POA: Diagnosis not present

## 2022-07-15 DIAGNOSIS — C719 Malignant neoplasm of brain, unspecified: Secondary | ICD-10-CM | POA: Diagnosis not present

## 2022-07-15 LAB — CBC
HCT: 40.6 % (ref 36.0–46.0)
Hemoglobin: 13.2 g/dL (ref 12.0–15.0)
MCH: 32.6 pg (ref 26.0–34.0)
MCHC: 32.5 g/dL (ref 30.0–36.0)
MCV: 100.2 fL — ABNORMAL HIGH (ref 80.0–100.0)
Platelets: 121 10*3/uL — ABNORMAL LOW (ref 150–400)
RBC: 4.05 MIL/uL (ref 3.87–5.11)
RDW: 13 % (ref 11.5–15.5)
WBC: 5.8 10*3/uL (ref 4.0–10.5)
nRBC: 0 % (ref 0.0–0.2)

## 2022-07-15 LAB — COMPREHENSIVE METABOLIC PANEL
ALT: 102 U/L — ABNORMAL HIGH (ref 0–44)
AST: 61 U/L — ABNORMAL HIGH (ref 15–41)
Albumin: 2.2 g/dL — ABNORMAL LOW (ref 3.5–5.0)
Alkaline Phosphatase: 98 U/L (ref 38–126)
Anion gap: 7 (ref 5–15)
BUN: 22 mg/dL (ref 8–23)
CO2: 26 mmol/L (ref 22–32)
Calcium: 8.4 mg/dL — ABNORMAL LOW (ref 8.9–10.3)
Chloride: 105 mmol/L (ref 98–111)
Creatinine, Ser: 0.58 mg/dL (ref 0.44–1.00)
GFR, Estimated: >60 mL/min (ref >60)
Glucose, Bld: 259 mg/dL — ABNORMAL HIGH (ref 70–99)
Potassium: 4.3 mmol/L (ref 3.5–5.1)
Sodium: 138 mmol/L (ref 135–145)
Total Bilirubin: 0.4 mg/dL (ref 0.3–1.2)
Total Protein: 5.4 g/dL — ABNORMAL LOW (ref 6.5–8.1)

## 2022-07-15 NOTE — Progress Notes (Signed)
Speech Language Pathology Treatment: Dysphagia;Cognitive-Linquistic  Patient Details Name: Miranda Martinez MRN: [id number] DOB: [birth year] Today's Date: 07/15/2022 Time: 1130-1150 SLP Time Calculation (min) (ACUTE ONLY): 20 min  Assessment / Plan / Recommendation Clinical Impression  Pt seen for f/u dysphagia/cognitive tx with lethargy impacting session.  Oral care provided with pt clamping onto swab during completion with oral directives followed intermittently with max multimodal cues provided.  Pt given ice chip with prolonged oral transit/manipulation, lingual weakness/incoordination and delayed swallow initiation noted.  Puree consistency removed from oral cavity with no oral manipulation observed.  Thin attempted via tsp without success, so straw occlusion with 1-2cc of thin given with delayed cough observed. Pt with attempts to communicate ineffective for answering personal information questions with min use of gestures this date also d/t lethargy.  Continue ST in acute setting for dysphagia/cognitive tx.  Continue NPO status for safety with non-oral feeding for nutrition/hydration purposes and consideration of pleasure feeding if pt continues to decline in acute setting.     HPI HPI: 76 y.o. 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      SLP Plan  Continue with current plan of care      Recommendations for follow up therapy are one component of a multi-disciplinary discharge planning process, led by the attending physician.  Recommendations may be updated based on patient status, additional functional criteria and insurance authorization.    Recommendations  Diet recommendations: NPO (consider pleasure feedings if palliative involved with D1/thin tsp) Medication Administration: Via alternative means                Oral Care Recommendations: Staff/trained  caregiver to provide oral care;Oral care QID Follow Up Recommendations: Follow physician's recommendations for discharge plan and follow up therapies Assistance recommended at discharge: Frequent or constant Supervision/Assistance SLP Visit Diagnosis: Dysphagia, oropharyngeal phase (R13.12);Cognitive communication deficit (Y69.485) Plan: Continue with current plan of care           Elvina Sidle, M.S., CCC-SLP  07/15/2022, 12:04 PM

## 2022-07-15 NOTE — Progress Notes (Signed)
PROGRESS NOTE Miranda Martinez  IWP:[id number] DOB: [birth year] DOA: 07/01/2022 PCP: [name], DO   Brief Narrative/Hospital Course: 76 year old female with recently diagnosed high-grade glioma earlier this month not on treatment, admitted with working diagnosis of new onset seizures/acute encephalopathy. Patient was staying with her son and had been functioning independently until morning PTA presented with acute worsening neurological symptoms altered mental status, aphasia, facial twitching. Seen in the ED underwent extensive work-up evaluation, by neurology.  Managed with multiple AEDs, with waxing and waning mental status.  Seen by palliative care.  Care has been challenging given need for AEDs at the same time being sedated. After further subsequent discussion with son, on 10/27, decided to proceed with referral to hospice of the [facility] for inpatient hospice placement.    Subjective: Seen and examined at bedside.  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  Goal is to avoid seizure, minimize sedation.  Per Dr. [name] patient will not be candidate for chemoradiation unless she has significant improvement in mental and physical wellbeing Current AEDs being adjusted by neurology and pharmacy Patient more lethargic following medication, care has been challenging given need for AEDs at the same time sedation After further subsequent discussion son on 10/27decided to proceed with referral to hospice of the [facility] for inpatient hospice placement Continue with current care until hospice bed is  available  Dysphagia: on small bore feeding tube feeding Mild transaminitis:monitor> trending down Urinary retention: Foley catheter back in place 10/25 Hyperglycemia HbA1c normal.  Monitor Acute cystitis due to E. Coli: completed antibiotics Diarrhea: Supportive care-no abdominal pain fever no leukocytosis.  She is on tube feeding. GOC: Referred to inpatient hospice.    Severe malnutrition:followed by dietitian Etiology: chronic illness (cancer) Signs/Symptoms: percent weight loss, severe muscle depletion (10.9% weight loss x 3 months) Percent weight loss: 10.9 % Interventions: Refer to RD note for recommendations  DVT prophylaxis: SCD's Start: 07/01/22 1525 Code Status:   Code Status: DNR Family Communication: TOC palliative care following.   Patient status is: Inpatient because of high-grade glioma with seizure Level of care: Med-Surg  Dispo:Anticipated disposition: Inpatient hospice    Objective: Vitals last 24 hrs: Vitals:   07/15/22 0746 07/15/22 1110 07/15/22 1115 07/15/22 1611  BP: 115/82 114/73  104/65  Pulse: 86 91  87  Resp: '18 19  19  '$ Temp: 99 F (37.2 C)     TempSrc: Axillary     SpO2: 98% (!) 88% 97% 97%  Weight:         Physical Examination: General: NAD, lethargic, sleepy Cardiovascular: S1, S2 present Respiratory: CTAB Abdomen: Soft, nontender, nondistended, bowel sounds present Musculoskeletal: No bilateral pedal edema noted Skin: Normal Psychiatry: Unable to assess   Medications reviewed:scheduled Meds:  Chlorhexidine Gluconate Cloth  6 each Topical Daily   dexamethasone (DECADRON) injection  4 mg Intravenous Q12H   free water  75 mL Per Tube Q4H   lacosamide  200 mg Per Tube BID   levETIRAcetam  1,000 mg Per Tube BID   mouth rinse  15 mL Mouth Rinse 4 times per day   phenytoin  125 mg Per Tube TID   thiamine  100 mg Per Tube Daily   Continuous Infusions:  feeding supplement (JEVITY 1.2 CAL) 1,000 mL (07/15/22 0900)    Diet Order      None       Intake/Output Summary (Last 24 hours) at 07/15/2022 1721 Last data filed at 07/15/2022 1600 Gross per 24 hour  Intake 3595 ml  Output 1135 ml  Net 2460 ml   Net IO Since Admission: -3,181.69 mL [07/15/22 1721]  Wt Readings from Last 3 Encounters:  07/15/22 57.1 kg  06/18/22 58 kg  06/16/22 56.7 kg     Unresulted Labs (From admission, onward)    None     Data Reviewed: I have personally reviewed following labs and imaging studies CBC: Recent Labs  Lab 07/09/22 0646 07/10/22 0531 07/11/22 0505 07/13/22 0225 07/15/22 0334  WBC 6.8 8.5 8.4 6.7 5.8  HGB 15.0 14.9 14.0 13.5 13.2  HCT 45.3 44.3 40.6 39.4 40.6  MCV 99.3 98.0 99.3 99.0 100.2*  PLT 121* 111* 107* 111* 195*   Basic Metabolic Panel: Recent Labs  Lab 07/09/22 0646 07/09/22 1914 07/10/22 0531 07/11/22 0505 07/12/22 0656 07/13/22 0225 07/15/22 0334  NA 140  --   --  138  --  138 138  K 3.6  --   --  3.9  --  4.3 4.3  CL 104  --   --  105  --  100 105  CO2 28  --   --  28  --  26 26  GLUCOSE 166*  --   --  179*  --  246* 259*  BUN 17  --   --  14  --  16 22  CREATININE 0.64  --   --  0.50  --  0.50 0.58  CALCIUM 8.3*  --   --  8.3*  --  8.6* 8.4*  MG 2.0 1.7 1.8  --   --   --   --   PHOS 2.1* 2.1* 2.3*  --  3.2  --   --    GFR: Estimated Creatinine Clearance: 50.9 mL/min (by C-G formula based on SCr of 0.58 mg/dL). Liver Function Tests: Recent Labs  Lab 07/09/22 0646 07/11/22 0505 07/15/22 0334  AST 58* 29 61*  ALT 88* 64* 102*  ALKPHOS 67 66 98  BILITOT 0.8 0.2* 0.4  PROT 5.4* 5.1* 5.4*  ALBUMIN 2.6* 2.1* 2.2*   BG: Recent Labs  Lab 07/09/22 1151 07/09/22 1557 07/09/22 2010 07/14/22 1958 07/14/22 2323  GLUCAP 171* 182* 163* 191* 215*   No results found for this or any previous visit (from the past 240 hour(s)).   Antimicrobials: Anti-infectives (From admission, onward)    Start     Dose/Rate Route Frequency Ordered Stop   07/04/22 1515  piperacillin-tazobactam  (ZOSYN) IVPB 3.375 g        3.375 g 12.5 mL/hr over 240 Minutes Intravenous Every 8 hours 07/04/22 1502 07/07/22 1748      Culture/Microbiology    Component Value Date/Time   SDES BLOOD BLOOD LEFT HAND 07/03/2022 1550   SPECREQUEST  07/03/2022 1550    BOTTLES DRAWN AEROBIC AND ANAEROBIC Blood Culture results may not be optimal due to an inadequate volume of blood received in culture bottles   CULT  07/03/2022 1550    NO GROWTH 5 DAYS Performed at [facility] [address] [place], [place] [address]    REPTSTATUS 07/08/2022 FINAL 07/03/2022 1550    Other culture-see note  Radiology Studies: No results found.  LOS: 14 days   Alma Friendly, MD [facility]  07/15/2022, 5:21 PM

## 2022-07-15 NOTE — TOC Progression Note (Signed)
Transition of Care [facility]) - Progression Note    Patient Details  Name: Miranda Martinez MRN: [id number] Date of Birth: 1946-01-29  Transition of Care [facility]) CM/SW Contact  Pollie Friar, RN Phone Number: 07/15/2022, 12:47 PM  Clinical Narrative:    [facility] has a bed available today, however son was considering home with hospice. Cheri with [facility] spoke to pts son, Miranda Martinez and they have decided on residential. Pandora Leiter is not available to sign paperwork, etc today. Cheri is checking to see about a bed for tomorrow.  TOC following.   Expected Discharge Plan: [place] Barriers to Discharge: Continued Medical Work up  Expected Discharge Plan and Services Expected Discharge Plan: [place] In-house Referral: Clinical Social Work Discharge Planning Services: CM Consult                                           Social Determinants of Health (SDOH) Interventions    Readmission Risk Interventions     No data to display

## 2022-07-16 DIAGNOSIS — C719 Malignant neoplasm of brain, unspecified: Secondary | ICD-10-CM | POA: Diagnosis not present

## 2022-07-16 DIAGNOSIS — E43 Unspecified severe protein-calorie malnutrition: Secondary | ICD-10-CM | POA: Diagnosis not present

## 2022-07-16 DIAGNOSIS — R2990 Unspecified symptoms and signs involving the nervous system: Secondary | ICD-10-CM | POA: Diagnosis not present

## 2022-07-16 DIAGNOSIS — G936 Cerebral edema: Secondary | ICD-10-CM | POA: Diagnosis not present

## 2022-07-16 LAB — GLUCOSE, CAPILLARY
Glucose-Capillary: 181 mg/dL — ABNORMAL HIGH (ref 70–99)
Glucose-Capillary: 213 mg/dL — ABNORMAL HIGH (ref 70–99)
Glucose-Capillary: 148 mg/dL — ABNORMAL HIGH (ref 70–99)

## 2022-07-16 MED ORDER — LACOSAMIDE 10 MG/ML PO SOLN: 200.0000 mg | Freq: Two times a day (BID) | ORAL | 0 refills | Status: DC

## 2022-07-16 MED ORDER — LEVETIRACETAM 100 MG/ML PO SOLN: 1000.0000 mg | Freq: Two times a day (BID) | ORAL | 12 refills | Status: DC

## 2022-07-16 MED ORDER — LORAZEPAM 2 MG/ML IJ SOLN: 1.0000 mg | Freq: Once | INTRAMUSCULAR | Status: DC

## 2022-07-16 MED ORDER — DEXAMETHASONE SODIUM PHOSPHATE 4 MG/ML IJ SOLN: 4.0000 mg | Freq: Two times a day (BID) | INTRAMUSCULAR | 0 refills | Status: DC

## 2022-07-16 MED ORDER — PHENYTOIN 125 MG/5ML PO SUSP: 125.0000 mg | Freq: Three times a day (TID) | ORAL | 12 refills | Status: DC

## 2022-07-16 NOTE — Progress Notes (Signed)
Report at bedside to [facility] and care transferred. All belongings with pt

## 2022-07-16 NOTE — Progress Notes (Signed)
Daily Progress Note   Patient Name: Miranda Martinez       Date: 07/16/2022 DOB: [birth year]  Age: 76 y.o. MRN#: [id number] Attending Physician: Elmarie Shiley, MD Primary Care Physician: [name], DO Admit Date: 07/01/2022  Reason for Consultation/Follow-up: Establishing goals of care  Subjective: Chart review performed. Received report from primary RN - no acute concerns. RN reports patient is intermittently alert, confused. Reviewed SLP evaluation from yesterday with RN.   Went to visit patient at bedside - no family/visitors present. Patient was lying in bed asleep - I did not attempt to wake her. No signs or non-verbal gestures of pain or discomfort noted. No respiratory distress, increased work of breathing, or secretions noted. Coretrak in use.  She is on room air.  Called son/Rob - emotional support provided. He and family are on their way to [facility] facility for a tour. If the tour goes well, he indicates their goal will be for patient's transfer to the facility. He was told per hospice liaison, there is a bed available for patient today.  Therapeutic listening provided as he reflects on patient's 2 lucid days where she repeatedly stated that she "wants to get out of here."  Reviewed SLP evaluation from yesterday with Rob.  Detailed education provided on the option of pleasure feeds versus continuing n.p.o. status and tube feeds. Rob is not agreeable to stop tube feeds or initiate pleasure feeds. He is also not interested in patient's transition to full comfort care in house. He does understand that upon patient's transfer to hospice facility tube feeds would be stopped.  He questions if a family member can ride in the ambulance with patient as she is being transferred  to hospice facility -unsure of this answer, will notify TOC.  All questions and concerns addressed. Encouraged to call with questions and/or concerns. PMT card previously provided.  Discussed case in person with Dr. Tyrell Antonio.  Discussed symptom management plan with hospice liaison -they will transition patient's seizure prophylactic medications from tube/oral to IV.  Will discontinue coretrak upon transfer/discharge.  Length of Stay: 15  Current Medications: Scheduled Meds:   Chlorhexidine Gluconate Cloth  6 each Topical Daily   dexamethasone (DECADRON) injection  4 mg Intravenous Q12H   free water  75 mL Per Tube Q4H   lacosamide  200  mg Per Tube BID   levETIRAcetam  1,000 mg Per Tube BID   mouth rinse  15 mL Mouth Rinse 4 times per day   phenytoin  125 mg Per Tube TID   thiamine  100 mg Per Tube Daily    Continuous Infusions:  feeding supplement (JEVITY 1.2 CAL) 1,000 mL (07/16/22 0554)    PRN Meds: acetaminophen **OR** acetaminophen (TYLENOL) oral liquid 160 mg/5 mL **OR** acetaminophen, mouth rinse  Physical Exam Vitals and nursing note reviewed.  Constitutional:      General: She is not in acute distress.    Appearance: She is ill-appearing.  Pulmonary:     Effort: No respiratory distress.  Skin:    General: Skin is warm and dry.  Neurological:     Mental Status: She is lethargic.     Motor: Weakness present.             Vital Signs: BP 116/71 (BP Location: Left Arm)   Pulse 85   Temp 98.2 F (36.8 C) (Oral)   Resp 19   Wt 55.8 kg   SpO2 97%   BMI 21.12 kg/m  SpO2: SpO2: 97 % O2 Device: O2 Device: Room Air O2 Flow Rate: O2 Flow Rate (L/min): 2 L/min  Intake/output summary:  Intake/Output Summary (Last 24 hours) at 07/16/2022 1052 Last data filed at 07/16/2022 0543 Gross per 24 hour  Intake 2695 ml  Output 1100 ml  Net 1595 ml   LBM: Last BM Date : 07/16/22 Baseline Weight: Weight: 57.8 kg (estimated per chart review) Most recent weight: Weight:  55.8 kg       Palliative Assessment/Data: PPS 30% with tube feeds      Patient Active Problem List   Diagnosis Date Noted   Cerebral edema ([place]) 07/10/2022   Protein-calorie malnutrition, severe 07/09/2022   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 female  with past medical history of recent diagnosis of high-grade glioma admitted on 07/01/2022 with stroke-like symptoms.   Assessment: Principal Problem:   Glioblastoma multiforme of brain (HCC) Active Problems:   Stroke-like symptoms   Protein-calorie malnutrition, severe   Cerebral edema (HCC)   Concern about end-of-life  Recommendations/Plan: Continue current supportive care - son not interested in her transition to comfort measures while in house Continue DNR/DNI as previously documented Son not interested in pursuing pleasure feeding - continue NPO status and tube feeds Transfer to hospice facility if/when son agreeable after his tour of the facility - he understands coretrak will be removed upon discharge PMT will continue to follow and support holistically  Goals of Care and Additional Recommendations: Limitations on Scope of Treatment: Full Scope Treatment and No Tracheostomy  Code Status:    Code Status Orders  (From admission, onward)           Start     Ordered   07/02/22 1336  Do not attempt resuscitation (DNR)  Continuous       Question Answer Comment  In the event of cardiac or respiratory ARREST Do not call a "code blue"   In the event of cardiac or respiratory ARREST Do not perform Intubation, CPR, defibrillation or ACLS   In the event of cardiac or respiratory ARREST Use medication by any route, position, wound care, and other measures to relive pain and suffering. May use oxygen, suction and manual treatment of airway  obstruction as needed for comfort.       07/02/22 1335           Code Status History     Date Active Date Inactive Code Status Order ID Comments User Context   07/01/2022 1526 07/02/2022 1335 Full Code [id number]  Karmen Bongo, MD ED   06/16/2022 2145 06/21/2022 2334 Full Code [id number]  Lenore Cordia, MD ED       Prognosis:  < 2 weeks  Discharge Planning: [place] was discussed with primary RN, patient's son, [facility], hospice liaison, Dr. Tyrell Antonio  Thank you for allowing the Palliative Medicine Team to assist in the care of this patient.   Total Time 50 minutes Prolonged Time Billed  no       Greater than 50%  of this time was spent counseling and coordinating care related to the above assessment and plan.  Lin Landsman, NP  Please contact Palliative Medicine Team phone at [phone] for questions and concerns.   *Portions of this note are a verbal dictation therefore any spelling and/or grammatical errors are due to the "[facility]" system interpretation.

## 2022-07-16 NOTE — Discharge Summary (Signed)
Physician Discharge Summary   Patient: Miranda Martinez MRN: [id number] DOB: 11-24-1945  Admit date:     07/01/2022  Discharge date: 07/16/22  Discharge Physician: Elmarie Shiley   PCP: [name], DO   Recommendations at discharge:    Plan to transition to Residential Hospice facility.   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 *  Hospital Course: 76 year old recently diagnosed with high-grade glioma earlier this month not on treatment, admitted with working diagnosis of new onset seizure/acute encephalopathy.  Patient was sustained with her son and had been functioning independently onto the morning prior to admission.  She presented with acute worsening neurological symptoms altered mental status, aphasia, facial twitching.  She was seen in the ED underwent extensive work-up evaluation by neurology.  Managed with multiple AEDs, with waxing and waning mental status.  She has been seen by palliative care.  Care has been challenging given need for AEDs at the same time she has been sedated.  After further subsequent discussion with son on 10/27, decided to proceed with referral to [facility] for inpatient hospice placement. Plan to transition to residential hospice today, after family tour the facility.   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 -Per Dr. [name] patient will not be candidate for chemoradiation unless he has  significant improvement in mental status and physical wellbeing. -Adjustment of AEDs medication has been challenging to avoid sedation and to be able to control seizures. -Palliative care following, after further discussion with son and 10/27 decision was to proceed with referral to [facility] for hospice placement. -Currently on IV Decadron 4 mg every 12 hours.  This can be continued for comfort -Continue with Keppra and Vimpat, Phenytoin which could be transition to IV form. -Could use IV PRN ativan  Plan to transition to residential hospice today after family told the facility  Dysphagia: On tube feedings.  Palliative discussed with son, plan to remove NG tube prior to transfer.  Transaminitis Urinary retention: Continue with Foley catheter which was placed 10/25 Hyperglycemia: A1c was normal.  Suspect related to Decadron Acute cystitis secondary to E. coli: Completed antibiotics Diarrhea: Supportive care, no fevers no leukocytosis.  Likely related to tube feedings Severe malnutrition: In the setting of malignancy, poor oral intake.        Consultants: Neurology, neurosurgery , palliative Procedures performed: EEG Disposition: Hospice care Diet recommendation:  Discharge Diet Orders (From admission, onward)     Start     Ordered   07/16/22 0000  Diet - low sodium heart healthy        07/16/22 1201           NPO   DISCHARGE MEDICATION: Allergies as of 07/16/2022       Reactions   Codeine Itching        Medication List     STOP taking these medications    dexamethasone 4 MG tablet Commonly known as: DECADRON   Vitamin B Complex Tabs   Vitamin D-1000 Max St 25 MCG (1000 UT) tablet Generic  drug: Cholecalciferol       TAKE these medications    dexamethasone 4 MG/ML injection Commonly known as: DECADRON Inject 1 mL (4 mg total) into the vein every 12 (twelve) hours.   lacosamide 10 MG/ML oral solution Commonly known as: VIMPAT Take 20 mLs  (200 mg total) by mouth 2 (two) times daily.   levETIRAcetam 100 MG/ML solution Commonly known as: KEPPRA Place 10 mLs (1,000 mg total) into feeding tube 2 (two) times daily.   phenytoin 125 MG/5ML suspension Commonly known as: DILANTIN Place 5 mLs (125 mg total) into feeding tube 3 (three) times daily.        Discharge Exam: Filed Weights   07/14/22 0500 07/15/22 0409 07/16/22 0433  Weight: 57.1 kg 57.1 kg 55.8 kg   General. Sleepy, does not appears in distress or pain.   Condition at discharge: poor  The results of significant diagnostics from this hospitalization (including imaging, microbiology, ancillary and laboratory) are listed below for reference.   Imaging Studies: DG Abd Portable 1V  Result Date: 07/08/2022 CLINICAL DATA:  [id number] Encounter for feeding tube placement [id number] EXAM: PORTABLE ABDOMEN - 1 VIEW COMPARISON:  None Available. FINDINGS: Supine frontal view of the abdomen and pelvis was performed, excluding the right hemiabdomen and lower pelvis by collimation. Enteric catheter passes below diaphragm tip projecting over the region of the pylorus. Bowel gas pattern is unremarkable. IMPRESSION: 1. Enteric catheter tip projecting in the region of the pylorus. Electronically Signed   By: Randa Ngo M.D.   On: 07/08/2022 14:44   CT HEAD WO CONTRAST (5MM)  Result Date: 07/05/2022 CLINICAL DATA:  Mental status change EXAM: CT HEAD WITHOUT CONTRAST TECHNIQUE: Contiguous axial images were obtained from the base of the skull through the vertex without intravenous contrast. RADIATION DOSE REDUCTION: This exam was performed according to the departmental dose-optimization program which includes automated exposure control, adjustment of the mA and/or kV according to patient size and/or use of iterative reconstruction technique. COMPARISON:  07/01/2022 CT head, 07/04/2022 MRI head FINDINGS: Brain: Redemonstrated infiltrative tumor in the left cerebral hemisphere, which is  better evaluated on the prior MRI. Slightly increased hyperdense hemorrhage associated with the left periatrial hypercellular component (series 5, image 17). 8 mm of left-to-right midline shift, unchanged when remeasured similarly. Unchanged mass effect on the left-greater-than-right lateral ventricle and third ventricle. Again noted is a small amount of hyperdense hemorrhage adjacent to the left parietal burr hole. No evidence of acute infarction, hydrocephalus, or new extra-axial fluid collection. Vascular: No hyperdense vessel. Skull: Left parietal burr hole. Negative for fracture or focal lesion. Sinuses/Orbits: No acute finding. Other: The mastoid air cells are well aerated. IMPRESSION: 1. Redemonstrated infiltrative tumor in the left cerebral hemisphere, which is better evaluated on the prior MRI. Slightly increased hyperdense hemorrhage associated with the left periatrial hypercellular component compared to 07/01/2022. 2. Unchanged 8 mm of left-to-right midline shift. Unchanged mass effect on the left-greater-than-right lateral ventricle and third ventricle. 3. Unchanged small amount of hyperdense hemorrhage adjacent to the left parietal burr hole. These results will be called to the ordering clinician or representative by the Radiologist Assistant, and communication documented in the PACS or [employer]. Electronically Signed   By: Merilyn Baba M.D.   On: 07/05/2022 15:56   MR BRAIN W WO CONTRAST  Result Date: 07/04/2022 CLINICAL DATA:  Seizure disorder, clinical change. EXAM: MRI HEAD WITHOUT AND WITH CONTRAST TECHNIQUE: Multiplanar, multiecho pulse sequences of the brain and surrounding structures were obtained without and with  intravenous contrast. CONTRAST:  37m GADAVIST GADOBUTROL 1 MMOL/ML IV SOLN COMPARISON:  MRI of the brain June 16, 2022; head CT July 01 2022. FINDINGS: Brain: No acute infarction, acute hemorrhage, hydrocephalus or extra-axial collection. No significant interval  change of the diffuse infiltrative T2 hyperintensity involving most of the left cerebral hemisphere including left frontal, temporal, parietal and occipital lobes, basal ganglia and thalamus with loss of gray-white differentiation, mass effect with gyral effacement and a 9 mm rightward midline shift. No ventricular entrapment. Left periatrial area of mildly restricted diffusion, likely corresponding to area of hypercellularity is unchanged. No focus of abnormal contrast enhancement identified. Vascular: Normal flow voids. Skull and upper cervical spine: No focal marrow lesion identified. Burr hole in the left temporal region. Sinuses/Orbits: Negative. Other: None. IMPRESSION: 1. No acute abnormality identified. 2. No significant interval change of the known infiltrative high-grade primary neoplasm involving most of the left cerebral hemisphere with mass effect resulting in a 9 mm rightward midline shift, similar to prior. No ventricular entrapment. Electronically Signed   By: KPedro EarlsM.D.   On: 07/04/2022 15:56   Overnight EEG with video  Result Date: 07/02/2022 YLora Havens MD     07/03/2022 10:20 AM Patient Name: LRosaleigh BrazzelMRN: [id number] Attending: PLora HavensReferring Physician/Provider: OJudith Part MD Duration: 07/01/2022 1755 to 07/02/2022 1755 Patient history: 76y.o. 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 EEG to evaluate for seizure. Level of alertness:  lethargic AEDs during EEG study: LEV Technical aspects: This EEG study was done with scalp electrodes positioned according to the 10-20 International system of electrode placement. Electrical activity was reviewed with band pass filter of 1-'70Hz'$ , sensitivity of 7 uV/mm, display speed of 343msec with a '60Hz'$  notched filter applied as appropriate. EEG data were recorded continuously and  digitally stored.  Video monitoring was available and reviewed as appropriate. Description: The posterior dominant rhythm consists of 8 Hz activity of moderate voltage (25-35 uV) seen predominantly in posterior head regions, asymmetric and reactive to eye opening and eye closing.  EEG also showed continuous 3 to 5 Hz theta-delta slowing in left hemisphere as well as intermittent 3 to 5 Hz theta-delta slowing in right hemisphere admixed with 15 to 18 Hz generalized beta activity. At the beginning of the study, lateralized periodic discharges at 0.25 to '1hz'$  were noted in left hemisphere. Gradually, the periodic discharges appeared more frequent and at times appeared rhythmic lasting 3 to 5 seconds consistent with brief ictal-interictal rhythmic discharges. After around 1 AM on 07/02/2022 , seizures without clinical signs were noted in left hemisphere. Gradually, the frequency of seizures increased and occupied more than 20% of the recording, therefore consistent with electrographic status epilepticus.  As medications were adjusted, status epilepticus resolved after around 1400 on 07/02/2022.  EEG then showed continuous generalized and lateralized left hemisphere 3 to 5 Hz theta and delta slowing admixed with 12 to 14 Hz generalized beta activity.  Sharp waves were noted in left frontal region which appeared qasi-periodic at 0.5-1 Hz Hyperventilation and photic stimulation were not performed.   ABNORMALITY - Electrographic status epilepticus, left hemisphere - Continuous slow, left hemisphere - Intermittent slow, right hemisphere IMPRESSION: This study was initially suggestive of cortical dysfunction and epileptogenicity in left hemisphere with increased risk of seizures.  After around 1 AM on 07/02/2022, seizures without clinical signs were noted arising from left  hemisphere. Gradually, the frequency of seizures increased and occupied more than 20% of the recording and therefore consistent with electrographic status  epilepticus.  As medications were adjusted, status epilepticus resolved 1400 on 10/05/2021.  EEG was then suggestive of cortical dysfunction and a epileptogenicity in left hemisphere with increased risk of seizures. Additionally there is mild to moderate diffuse encephalopathy, likely related to seizures. Dr Leonel Ramsay was notified. Lora Havens   CT VENOGRAM HEAD  Result Date: 07/01/2022 CLINICAL DATA:  Follow-up left-sided biopsy for tumor. EXAM: CT VENOGRAM HEAD TECHNIQUE: Venographic phase images of the brain were obtained following the administration of intravenous contrast. Multiplanar reformats and maximum intensity projections were generated. RADIATION DOSE REDUCTION: This exam was performed according to the departmental dose-optimization program which includes automated exposure control, adjustment of the mA and/or kV according to patient size and/or use of iterative reconstruction technique. CONTRAST:  87m OMNIPAQUE IOHEXOL 350 MG/ML SOLN COMPARISON:  Arterial study same day. Multiple other recent imaging studies beginning 06/16/2022. FINDINGS: Superior sagittal sinus is widely patent. Deep venous system is widely patent. Transverse sinuses, sigmoid sinuses and jugular veins show flow. Normal appearing cavernous sinuses. As noted on the arterial study, there is a prominent vein on the left draining superficial from near the region of the biopsy. I would favor that this is a prominent superficial draining vein possibly due to hyperemia in the region. The unusual possibility of a post biopsy arteriovenous fistula is considered, but that would seem unlikely. IMPRESSION: 1. No evidence of dural venous sinus thrombosis. 2. Prominent superficial draining vein on the left near the region of the biopsy. I would favor that this is a prominent draining vein possibly due to hyperemia in the region. The unusual possibility of a post biopsy arteriovenous fistula is considered, but that would seem unlikely.  Electronically Signed   By: MNelson ChimesM.D.   On: 07/01/2022 14:20   CT ANGIO HEAD NECK W WO CM (CODE STROKE)  Result Date: 07/01/2022 CLINICAL DATA:  Neuro deficit, acute, stroke suspected EXAM: CT ANGIOGRAPHY HEAD AND NECK TECHNIQUE: Multidetector CT imaging of the head and neck was performed using the standard protocol during bolus administration of intravenous contrast. Multiplanar CT image reconstructions and MIPs were obtained to evaluate the vascular anatomy. Carotid stenosis measurements (when applicable) are obtained utilizing NASCET criteria, using the distal internal carotid diameter as the denominator. RADIATION DOSE REDUCTION: This exam was performed according to the departmental dose-optimization program which includes automated exposure control, adjustment of the mA and/or kV according to patient size and/or use of iterative reconstruction technique. CONTRAST:  548mOMNIPAQUE IOHEXOL 350 MG/ML SOLN COMPARISON:  Multiple recent imaging studies including head CT earlier same day. FINDINGS: CTA NECK FINDINGS Aortic arch: Aortic atherosclerosis, mild. Branching pattern is normal. Right carotid system: Common carotid artery widely patent to the bifurcation. Minimal plaque at the ICA bulb but no stenosis. Cervical ICA widely patent. Left carotid system: Common carotid artery widely patent to the bifurcation. Normal appearance of the bifurcation and ICA bulb. Cervical ICA is normal. Vertebral arteries: Both vertebral artery origins are widely patent. Both vertebral arteries are normal through the cervical region to the foramen magnum. Skeleton: Normal Other neck: No mass or lymphadenopathy. Upper chest: Lung apices are clear. Review of the MIP images confirms the above findings CTA HEAD FINDINGS Anterior circulation: Both internal carotid arteries are patent through the skull base and siphon regions. No siphon stenosis. The anterior and middle cerebral vessels are patent. No large vessel occlusion or  proximal stenosis. No aneurysm. There is a prominent vein extending superficial from the region of the operative approach. This could represent a normal vein that is well seen due to regional hyperemia, but the possibility of a small postoperative arteriovenous fistula is entertained. That would seem unlikely. Posterior circulation: Both vertebral arteries widely patent to the basilar. No basilar stenosis. Posterior circulation branch vessels are normal. Venous sinuses: Patent and normal. Anatomic variants: None significant. Review of the MIP images confirms the above findings IMPRESSION: 1. No large vessel occlusion or proximal stenosis. No aneurysm. 2. Prominent vein extending superficial from the region of the operative approach. This could represent a normal vein that is prominent due to regional hyperemia, but the possibility of a small postoperative arteriovenous fistula is entertained. That would seem unlikely. 3. Aortic atherosclerosis. Aortic Atherosclerosis (ICD10-I70.0). Electronically Signed   By: Nelson Chimes M.D.   On: 07/01/2022 14:17   CT HEAD CODE STROKE WO CONTRAST  Result Date: 07/01/2022 CLINICAL DATA:  Code stroke. EXAM: CT HEAD WITHOUT CONTRAST TECHNIQUE: Contiguous axial images were obtained from the base of the skull through the vertex without intravenous contrast. RADIATION DOSE REDUCTION: This exam was performed according to the departmental dose-optimization program which includes automated exposure control, adjustment of the mA and/or kV according to patient size and/or use of iterative reconstruction technique. COMPARISON:  CT head June 18, 2022. FINDINGS: Brain: No clear evidence of acute large vascular territory infarct. Small volume of hyperdense hemorrhage coursing linearly subjacent to the left-sided burr hole, probably a combination of intraparenchymal and extra-axial. Known infiltrating tumor better characterized on prior MRI. Similar mass effect with approximately 7 mm of  rightward midline shift. No evidence of hydrocephalus. Vascular: No hyperdense vessel identified. Skull: No acute fracture.  Left burr hole. Sinuses/Orbits: No acute findings. Other: No mastoid effusions. IMPRESSION: 1. Small volume of hyperdense hemorrhage coursing linearly subjacent to the left-sided burr hole (probably a combination of intraparenchymal and extra-axial) and likely related to recent biopsy. Recommend short interval follow-up CT head to ensure stability. 2. No clear evidence of acute large vascular territory infarct 3. Known infiltrating tumor better characterized on prior MRI. Similar mass effect with approximately 7 mm of rightward midline shift. Findings discussed with Dr. Leonel Ramsay via telephone at 1:52 PM Electronically Signed   By: Margaretha Sheffield M.D.   On: 07/01/2022 13:56   CT BRAINLAB HEAD W/O CONTRAST (1MM)  Result Date: 06/18/2022 CLINICAL DATA:  Provided history: Increased aphasia after brain biopsy. EXAM: CT HEAD WITHOUT CONTRAST TECHNIQUE: Contiguous axial images were obtained from the base of the skull through the vertex without intravenous contrast. RADIATION DOSE REDUCTION: This exam was performed according to the departmental dose-optimization program which includes automated exposure control, adjustment of the mA and/or kV according to patient size and/or use of iterative reconstruction technique. COMPARISON:  Prior head CT examinations 06/17/2022 and earlier. Brain MRI 06/16/2022. FINDINGS: Brain: Redemonstration of two hyperdense foci within the left cerebral hemisphere, one within the left periatrial white matter and the other within the lateral aspect of the posterior left temporal lobe. As before, there is fairly extensive surrounding abnormal hypodensity within the left cerebral hemisphere, basal ganglia and thalamus with associated mass effect. Unchanged partial effacement of the left lateral ventricle with 7 mm rightward midline shift. The constellation of  findings is favored to reflect a primary CNS neoplasm (such as a glioma) or lymphoma. New from the prior examination, a small burr hole is present within the left temporoparietal calvarium, in close proximity  to the hyperdense lesion in the lateral aspect of the posterior left temporal lobe (series 6, images 25-27). No evidence of postoperative hemorrhage at the biopsy site. Small-volume pneumocephalus is now present along the anteromedial aspect of the left frontal lobe. No demarcated cortical infarct. Vascular: No hyperdense vessel. Atherosclerotic calcifications. Skull: Small burr hole within the left temporoparietal calvarium. Sinuses/Orbits: No mass or acute finding within the imaged orbits. No significant paranasal sinus disease. IMPRESSION: Immediate postoperative changes from interval left cerebral biopsy, as described. No significant hemorrhage at the biopsy site. Small-volume pneumocephalus present along the anteromedial aspect of the left frontal lobe. Electronically Signed   By: Kellie Simmering D.O.   On: 06/18/2022 16:45   CT BRAINLAB HEAD W/O CONTRAST (1MM)  Result Date: 06/17/2022 CLINICAL DATA:  Provided history: Neurologic problem. EXAM: CT HEAD WITHOUT CONTRAST TECHNIQUE: Contiguous axial images were obtained from the base of the skull through the vertex without intravenous contrast. RADIATION DOSE REDUCTION: This exam was performed according to the departmental dose-optimization program which includes automated exposure control, adjustment of the mA and/or kV according to patient size and/or use of iterative reconstruction technique. COMPARISON:  Brain MRI 06/16/2022. Head CT 06/16/2022. FINDINGS: Brain: Non-contrast head CT performed for the purposes of intraoperative navigation. Redemonstration of two hyperdense areas, one within the left periatrial white matter and the other at the posterolateral aspect of the left temporal lobe. These foci correspond with areas of restricted diffusion  demonstrated on yesterday's brain MRI. As before, there is fairly extensive surrounding abnormal hypodensity within the left cerebral hemisphere, basal ganglia and thalamus with associated mass effect. Unchanged partial effacement of the left lateral ventricle with 7 mm rightward midline shift. There is no acute intracranial hemorrhage. No acute demarcated cortical infarct. No extra-axial fluid collection. Vascular: No hyperdense vessel. Skull: No fracture or aggressive osseous lesion. Sinuses/Orbits: No mass or acute finding within the imaged orbits. No significant paranasal sinus disease. IMPRESSION: Non-contrast head CT performed for the purposes of intraoperative navigation. Left cerebral abnormality, as described and unchanged from yesterday's CT exam. Favored differential considerations include a primary CNS neoplasm (such as a glioma) or lymphoma. Unchanged mass effect with partial effacement of the left lateral ventricle and 7 mm rightward midline shift. Electronically Signed   By: Kellie Simmering D.O.   On: 06/17/2022 16:46   CT Head Wo Contrast  Addendum Date: 06/16/2022   ADDENDUM REPORT: 06/16/2022 19:50 ADDENDUM: There is a dictation error within the second line of the first impression, which should read: Additionally, there is loss of gray-white differentiation within portions of the anterolateral left FRONTAL lobe and left insula." Electronically Signed   By: Kellie Simmering D.O.   On: 06/16/2022 19:50   Result Date: 06/16/2022 CLINICAL DATA:  Provided history: Mental status change, unknown cause. Intermittent confusion and word-finding difficulty. EXAM: CT HEAD WITHOUT CONTRAST TECHNIQUE: Contiguous axial images were obtained from the base of the skull through the vertex without intravenous contrast. RADIATION DOSE REDUCTION: This exam was performed according to the departmental dose-optimization program which includes automated exposure control, adjustment of the mA and/or kV according to patient  size and/or use of iterative reconstruction technique. COMPARISON:  No pertinent prior exams available for comparison. FINDINGS: Brain: 12 mm hyperdense focus in the left temporoparietal white matter with surrounding edema (for instance as seen on series 2, image 15). Ill-defined focus of hyperdensity more laterally along the posterior left temporal lobe, measuring 15 mm (for instance as seen on series 2, image 14) (series 4, image  23). Apparent loss of gray-white differentiation within portions of the anterolateral left frontal lobe and left insula. Mass effect arising from the left cerebral hemisphere with partial effacement of the left lateral ventricle and 5 mm rightward midline shift. Background mild patchy and ill-defined hypoattenuation within the cerebral white matter, nonspecific but compatible chronic small vessel disease. Vascular: No hyperdense vessel.  Atherosclerotic calcifications. Skull: No fracture or aggressive osseous lesion. Sinuses/Orbits: No mass or acute finding within the imaged orbits. No significant paranasal sinus disease at the imaged levels. These results were called by telephone at the time of interpretation on 06/16/2022 at 2:12 pm to provider RILEY RANSOM , who verbally acknowledged these results. IMPRESSION: 12 mm focus of hyperdensity within the left temporoparietal white matter with surrounding edema in the left cerebral hemisphere. Additionally, there is loss of gray-white differentiation within portions of the anterolateral left temporal lobe and left insula. Although nonspecific, this constellation of findings could potentially reflect a hemorrhagic mass or a recent infarct with hemorrhagic conversion. Associated mass effect with partial effacement of the left lateral ventricle and 5 mm rightward midline shift. A brain MRI without and with contrast is recommended for further characterization. Additional 15 mm focus of hyperdensity along the posterior left temporal lobe, which may  reflect acute parenchymal or subarachnoid hemorrhage or other hyperdense lesion. Background mild chronic small vessel image changes within the cerebral white matter. Electronically Signed: By: Kellie Simmering D.O. On: 06/16/2022 14:14   MR Brain W and Wo Contrast  Result Date: 06/16/2022 CLINICAL DATA:  Mental status change, mass suspected on head CT EXAM: MRI HEAD WITHOUT AND WITH CONTRAST TECHNIQUE: Multiplanar, multiecho pulse sequences of the brain and surrounding structures were obtained without and with intravenous contrast. CONTRAST:  5.5 mL Vueway COMPARISON:  No prior head MRI, correlation is made with 06/16/2022 CT head FINDINGS: Brain: 2 diffusion restricting areas with ADC correlates in the left medial temporal lobe (series 5, image 79) and left posterolateral temporal lobe (series 5, image 77), which correlate with the hyperdense area on the same-day CT. These areas are associated with increased T2 hyperintense signal and decreased T1 signal, but no definite enhancement. The more medial region measures 9 x 14 x 6 mm (series 16, image 30 and series 19, image 13). The more lateral region is less well-defined but measures approximately 21 x 23 x 13 mm (series 16, image 28 and series 19, image 9). Associated surrounding T2 hyperintense signal extends anteriorly into the left inferior frontal lobe, throughout the left temporal lobe, left basal ganglia and thalamus and into the left parietal and occipital lobes. There is associated mass effect with 7 mm left-to-right midline shift. Effacement of the left lateral and third ventricle, without evidence of hydrocephalus or entrapment of the left temporal horn. No acute infarct, hemorrhage, or extra-axial collection. Vascular: Normal arterial flow voids. Normal arterial and venous enhancement. Skull and upper cervical spine: Normal marrow signal. Sinuses/Orbits: No acute finding. Other: The mastoids are well aerated. IMPRESSION: Two diffusion restricting areas in  the left medial and posterolateral temporal lobe, with extensive surrounding T2 hyperintense signal, but no definite enhancement. 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 The suspected hypercellular tumor correlates with the hyperdense areas on the same-day CT This is associated with mass effect and 7 mm of left-to-right midline shift, with effacement of the left lateral and third ventricle but no evidence of  hydrocephalus. Electronically Signed   By: Merilyn Baba M.D.   On: 06/16/2022 19:48   CT CHEST ABDOMEN PELVIS W CONTRAST  Result Date: 06/16/2022 CLINICAL DATA:  Brain metastatic disease. 20-30 pound weight loss since July. EXAM: CT CHEST, ABDOMEN, AND PELVIS WITH CONTRAST TECHNIQUE: Multidetector CT imaging of the chest, abdomen and pelvis was performed following the standard protocol during bolus administration of intravenous contrast. RADIATION DOSE REDUCTION: This exam was performed according to the departmental dose-optimization program which includes automated exposure control, adjustment of the mA and/or kV according to patient size and/or use of iterative reconstruction technique. CONTRAST:  11m OMNIPAQUE IOHEXOL 300 MG/ML  SOLN COMPARISON:  None Available. FINDINGS: CT CHEST FINDINGS Cardiovascular: Heart normal in size and configuration. No pericardial effusion. Great vessels are normal in caliber. No thoracic aortic dissection or significant atherosclerosis. Arch branch vessels are widely patent. Mediastinum/Nodes: Normal thyroid. No neck base, mediastinal or hilar masses or enlarged lymph nodes. Trachea and esophagus are unremarkable. Lungs/Pleura: Mild peripheral interstitial thickening. Mild apical scarring. 3 mm subpleural nodule, left lower lobe, image 112, series 4. No other nodules. No masses. No evidence of pneumonia or pulmonary edema. No  pleural effusion or pneumothorax. Musculoskeletal: No fracture or acute finding. No osteoblastic or osteolytic lesions. No chest wall mass. CT ABDOMEN PELVIS FINDINGS Hepatobiliary: No focal liver abnormality is seen. No gallstones, gallbladder wall thickening, or biliary dilatation. Pancreas: Unremarkable. No pancreatic ductal dilatation or surrounding inflammatory changes. Spleen: Normal in size without focal abnormality. Adrenals/Urinary Tract: Normal adrenal glands. Kidneys normal in size, orientation and position with symmetric enhancement and excretion. 3-4 mm low-attenuation lesion, midpole of the right kidney, too small to characterize but consistent with a cyst. No follow-up recommended. No other renal masses or lesions, no stones and no hydronephrosis. Normal ureters. Normal bladder. Stomach/Bowel: Normal stomach. Small bowel and colon are normal in caliber. No wall thickening. No evidence of a mass. No inflammation. Normal appendix visualized. Vascular/Lymphatic: Minor aortic atherosclerosis. No aneurysm. No enlarged lymph nodes. Reproductive: Uterus and bilateral adnexa are unremarkable. Other: No abdominal wall hernia or abnormality. No abdominopelvic ascites. Musculoskeletal: Mild compression fractures of L1 and L2, unclear chronicity, most likely chronic. No bone lesion. IMPRESSION: 1. No acute findings within the chest, abdomen or pelvis. 2. No evidence of a primary malignancy. No convincing metastatic disease within the chest, abdomen and pelvis. 3 mm left lower lobe pulmonary nodule, most likely benign. Recommend attention on follow-up imaging. 3. Mild compression fractures of L1 and L2, of unclear chronicity, but likely chronic. Electronically Signed   By: DLajean ManesM.D.   On: 06/16/2022 15:34    Microbiology: Results for orders placed or performed during the hospital encounter of 07/01/22  Urine Culture     Status: Abnormal   Collection Time: 07/02/22  7:06 AM   Specimen: Urine, Clean  Catch  Result Value Ref Range Status   Specimen Description URINE, CLEAN CATCH  Final   Special Requests   Final    NONE Performed at [facility] [address], [place] [id number]   Culture (A)  Final    >=100,000 COLONIES/mL ESCHERICHIA COLI Confirmed Extended Spectrum Beta-Lactamase Producer (ESBL).  In bloodstream infections from ESBL organisms, carbapenems are preferred over piperacillin/tazobactam. They are shown to have a lower risk of mortality.    Report Status 07/04/2022 FINAL  Final   Organism ID, Bacteria ESCHERICHIA COLI (A)  Final      Susceptibility   Escherichia coli - MIC*  AMPICILLIN >=32 RESISTANT Resistant     CEFAZOLIN >=64 RESISTANT Resistant     CEFEPIME 16 RESISTANT Resistant     CEFTRIAXONE >=64 RESISTANT Resistant     CIPROFLOXACIN 0.5 INTERMEDIATE Intermediate     GENTAMICIN >=16 RESISTANT Resistant     IMIPENEM <=0.25 SENSITIVE Sensitive     NITROFURANTOIN <=16 SENSITIVE Sensitive     TRIMETH/SULFA <=20 SENSITIVE Sensitive     AMPICILLIN/SULBACTAM >=32 RESISTANT Resistant     PIP/TAZO <=4 SENSITIVE Sensitive     * >=100,000 COLONIES/mL ESCHERICHIA COLI  Culture, blood (Routine X 2) w Reflex to ID Panel     Status: None   Collection Time: 07/03/22  3:57 AM   Specimen: BLOOD LEFT WRIST  Result Value Ref Range Status   Specimen Description BLOOD LEFT WRIST  Final   Special Requests   Final    BOTTLES DRAWN AEROBIC ONLY Blood Culture results may not be optimal due to an inadequate volume of blood received in culture bottles   Culture   Final    NO GROWTH 5 DAYS Performed at [facility], [place] [address] [place], [place] [address]    Report Status 07/08/2022 FINAL  Final  Culture, blood (Routine X 2) w Reflex to ID Panel     Status: None   Collection Time: 07/03/22  3:50 PM   Specimen: BLOOD  Result Value Ref Range Status   Specimen Description BLOOD BLOOD LEFT HAND  Final   Special Requests   Final    BOTTLES DRAWN AEROBIC  AND ANAEROBIC Blood Culture results may not be optimal due to an inadequate volume of blood received in culture bottles   Culture   Final    NO GROWTH 5 DAYS Performed at [facility], [place] [address] [place], [place] [address]    Report Status 07/08/2022 FINAL  Final    Labs: CBC: Recent Labs  Lab 07/10/22 0531 07/11/22 0505 07/13/22 0225 07/15/22 0334  WBC 8.5 8.4 6.7 5.8  HGB 14.9 14.0 13.5 13.2  HCT 44.3 40.6 39.4 40.6  MCV 98.0 99.3 99.0 100.2*  PLT 111* 107* 111* 379*   Basic Metabolic Panel: Recent Labs  Lab 07/09/22 1914 07/10/22 0531 07/11/22 0505 07/12/22 0656 07/13/22 0225 07/15/22 0334  NA  --   --  138  --  138 138  K  --   --  3.9  --  4.3 4.3  CL  --   --  105  --  100 105  CO2  --   --  28  --  26 26  GLUCOSE  --   --  179*  --  246* 259*  BUN  --   --  14  --  16 22  CREATININE  --   --  0.50  --  0.50 0.58  CALCIUM  --   --  8.3*  --  8.6* 8.4*  MG 1.7 1.8  --   --   --   --   PHOS 2.1* 2.3*  --  3.2  --   --    Liver Function Tests: Recent Labs  Lab 07/11/22 0505 07/15/22 0334  AST 29 61*  ALT 64* 102*  ALKPHOS 66 98  BILITOT 0.2* 0.4  PROT 5.1* 5.4*  ALBUMIN 2.1* 2.2*   CBG: Recent Labs  Lab 07/09/22 2010 07/14/22 1958 07/14/22 2323 07/16/22 0726 07/16/22 1057  GLUCAP 163* 191* 215* 181* 213*    Discharge time spent: greater than 30  minutes.  Signed: Elmarie Shiley, MD [facility] 07/16/2022

## 2022-07-16 NOTE — TOC Transition Note (Signed)
Transition of Care [facility]) - CM/SW Discharge Note   Patient Details  Name: Miranda Martinez MRN: [id number] Date of Birth: Dec 12, 1945  [facility] CM/SW Contact:  Pollie Friar, RN Phone Number: 07/16/2022, 1:14 PM   Clinical Narrative:    [facility]. Pt will transport via [facility]. Son is at [facility] and has signed paperwork. Bedside RN updated and d/c packet is at the desk.   Number for report: [phone]   Final next level of care: Hospice Medical Facility Barriers to Discharge: No Barriers Identified   Patient Goals and CMS Choice   CMS Medicare.gov Compare Post Acute Care list provided to:: Patient Represenative (must comment) Choice offered to / list presented to : Adult Children  Discharge Placement                       Discharge Plan and Services In-house Referral: Clinical Social Work Discharge Planning Services: CM Consult                                 Social Determinants of Health (SDOH) Interventions     Readmission Risk Interventions     No data to display

## 2022-07-16 NOTE — Progress Notes (Signed)
Report called to hospice.  Will pull coretrak and flexiseal. Leave foley and midline.   Packing items for DC and transport by [facility].

## 2022-07-17 LAB — GLUCOSE, CAPILLARY: Glucose-Capillary: 246 mg/dL — ABNORMAL HIGH (ref 70–99)

## 2022-07-18 LAB — SURGICAL PATHOLOGY

## 2022-07-29 ENCOUNTER — Encounter (HOSPITAL_COMMUNITY): Payer: Self-pay

## 2022-08-16 DEATH — deceased
